# Patient Record
Sex: Female | Born: 1960 | Race: White | Hispanic: No | Marital: Single | Smoking: Never smoker
Health system: Southern US, Community
[De-identification: ages and names within clinical notes are randomized; demographics above are authoritative.]

## PROBLEM LIST (undated history)

## (undated) DIAGNOSIS — I447 Left bundle-branch block, unspecified: Secondary | ICD-10-CM

## (undated) DIAGNOSIS — I5022 Chronic systolic (congestive) heart failure: Principal | ICD-10-CM

## (undated) DIAGNOSIS — I472 Ventricular tachycardia, unspecified: Secondary | ICD-10-CM

## (undated) DIAGNOSIS — G4733 Obstructive sleep apnea (adult) (pediatric): Principal | ICD-10-CM

## (undated) DIAGNOSIS — E7800 Pure hypercholesterolemia, unspecified: Secondary | ICD-10-CM

## (undated) DIAGNOSIS — K219 Gastro-esophageal reflux disease without esophagitis: Secondary | ICD-10-CM

## (undated) DIAGNOSIS — I428 Other cardiomyopathies: Secondary | ICD-10-CM

## (undated) DIAGNOSIS — I509 Heart failure, unspecified: Secondary | ICD-10-CM

## (undated) HISTORY — DX: Gastro-esophageal reflux disease without esophagitis: K21.9

## (undated) HISTORY — DX: Pure hypercholesterolemia, unspecified: E78.00

## (undated) HISTORY — DX: Chronic systolic (congestive) heart failure: I50.22

## (undated) HISTORY — DX: Other cardiomyopathies: I42.8

## (undated) HISTORY — PX: TOTAL ABDOMINAL HYSTERECTOMY: SHX209

## (undated) HISTORY — DX: Left bundle-branch block, unspecified: I44.7

## (undated) HISTORY — DX: Ventricular tachycardia: I47.2

## (undated) HISTORY — DX: Morbid (severe) obesity due to excess calories: E66.01

## (undated) HISTORY — DX: Ventricular tachycardia, unspecified: I47.20

## (undated) HISTORY — DX: Obstructive sleep apnea (adult) (pediatric): G47.33

---

## 2004-05-31 ENCOUNTER — Other Ambulatory Visit: Admission: RE | Admit: 2004-05-31 | Discharge: 2004-05-31 | Payer: Self-pay | Admitting: Family Medicine

## 2005-08-06 ENCOUNTER — Other Ambulatory Visit: Admission: RE | Admit: 2005-08-06 | Discharge: 2005-08-06 | Payer: Self-pay | Admitting: Family Medicine

## 2007-04-02 ENCOUNTER — Other Ambulatory Visit: Admission: RE | Admit: 2007-04-02 | Discharge: 2007-04-02 | Payer: Self-pay | Admitting: Family Medicine

## 2008-04-13 ENCOUNTER — Other Ambulatory Visit: Admission: RE | Admit: 2008-04-13 | Discharge: 2008-04-13 | Payer: Self-pay | Admitting: Family Medicine

## 2009-07-19 ENCOUNTER — Other Ambulatory Visit: Admission: RE | Admit: 2009-07-19 | Discharge: 2009-07-19 | Payer: Self-pay | Admitting: Obstetrics and Gynecology

## 2009-08-13 ENCOUNTER — Encounter (INDEPENDENT_AMBULATORY_CARE_PROVIDER_SITE_OTHER): Payer: Self-pay | Admitting: Obstetrics and Gynecology

## 2009-08-13 ENCOUNTER — Inpatient Hospital Stay (HOSPITAL_COMMUNITY): Admission: RE | Admit: 2009-08-13 | Discharge: 2009-08-15 | Payer: Self-pay | Admitting: Obstetrics and Gynecology

## 2010-03-28 ENCOUNTER — Ambulatory Visit: Payer: Self-pay | Admitting: Cardiology

## 2010-05-20 ENCOUNTER — Ambulatory Visit: Payer: Self-pay | Admitting: Cardiology

## 2010-07-28 HISTORY — PX: CARDIAC DEFIBRILLATOR PLACEMENT: SHX171

## 2010-10-13 LAB — CBC
HCT: 47 % — ABNORMAL HIGH (ref 36.0–46.0)
Hemoglobin: 15.7 g/dL — ABNORMAL HIGH (ref 12.0–15.0)
MCHC: 33.5 g/dL (ref 30.0–36.0)
MCV: 94.5 fL (ref 78.0–100.0)
Platelets: 351 10*3/uL (ref 150–400)
RBC: 4.97 MIL/uL (ref 3.87–5.11)
RDW: 13 % (ref 11.5–15.5)
WBC: 9.9 10*3/uL (ref 4.0–10.5)

## 2010-10-13 LAB — COMPREHENSIVE METABOLIC PANEL
ALT: 16 U/L (ref 0–35)
AST: 21 U/L (ref 0–37)
Albumin: 3.8 g/dL (ref 3.5–5.2)
Alkaline Phosphatase: 49 U/L (ref 39–117)
BUN: 17 mg/dL (ref 6–23)
CO2: 27 mEq/L (ref 19–32)
Calcium: 9.3 mg/dL (ref 8.4–10.5)
Chloride: 101 mEq/L (ref 96–112)
Creatinine, Ser: 0.9 mg/dL (ref 0.4–1.2)
GFR calc Af Amer: >60 mL/min (ref >60)
GFR calc non Af Amer: >60 mL/min (ref >60)
Glucose, Bld: 99 mg/dL (ref 70–99)
Potassium: 3.7 mEq/L (ref 3.5–5.1)
Sodium: 136 mEq/L (ref 135–145)
Total Bilirubin: 0.4 mg/dL (ref 0.3–1.2)
Total Protein: 7.6 g/dL (ref 6.0–8.3)

## 2010-10-13 LAB — PREGNANCY, URINE: Preg Test, Ur: NEGATIVE

## 2010-10-14 LAB — BASIC METABOLIC PANEL
BUN: 7 mg/dL (ref 6–23)
CO2: 29 mEq/L (ref 19–32)
Calcium: 8.6 mg/dL (ref 8.4–10.5)
Chloride: 103 mEq/L (ref 96–112)
Creatinine, Ser: 0.69 mg/dL (ref 0.4–1.2)
GFR calc Af Amer: >60 mL/min (ref >60)
GFR calc non Af Amer: >60 mL/min (ref >60)
Glucose, Bld: 99 mg/dL (ref 70–99)
Potassium: 4.2 mEq/L (ref 3.5–5.1)
Sodium: 135 mEq/L (ref 135–145)

## 2010-10-14 LAB — TYPE AND SCREEN
ABO/RH(D): AB POS
Antibody Screen: NEGATIVE

## 2010-10-14 LAB — CBC
HCT: 38.7 % (ref 36.0–46.0)
Hemoglobin: 13.1 g/dL (ref 12.0–15.0)
MCHC: 33.7 g/dL (ref 30.0–36.0)
MCV: 95.1 fL (ref 78.0–100.0)
Platelets: 297 10*3/uL (ref 150–400)
RBC: 4.07 MIL/uL (ref 3.87–5.11)
RDW: 12.7 % (ref 11.5–15.5)
WBC: 15.7 10*3/uL — ABNORMAL HIGH (ref 4.0–10.5)

## 2010-10-14 LAB — ABO/RH: ABO/RH(D): AB POS

## 2011-01-18 ENCOUNTER — Other Ambulatory Visit: Payer: Self-pay | Admitting: Cardiology

## 2011-01-20 NOTE — Telephone Encounter (Signed)
escribe medication per fax request  

## 2011-09-27 ENCOUNTER — Other Ambulatory Visit: Payer: Self-pay | Admitting: Cardiology

## 2011-12-21 ENCOUNTER — Encounter: Payer: Self-pay | Admitting: *Deleted

## 2012-02-10 ENCOUNTER — Other Ambulatory Visit: Payer: Self-pay | Admitting: Cardiology

## 2012-06-10 ENCOUNTER — Encounter: Payer: Self-pay | Admitting: Cardiology

## 2012-09-17 ENCOUNTER — Encounter: Payer: Self-pay | Admitting: Cardiology

## 2013-04-25 ENCOUNTER — Encounter: Payer: Self-pay | Admitting: Internal Medicine

## 2013-04-25 ENCOUNTER — Other Ambulatory Visit: Payer: Self-pay | Admitting: Internal Medicine

## 2013-04-25 ENCOUNTER — Ambulatory Visit (INDEPENDENT_AMBULATORY_CARE_PROVIDER_SITE_OTHER): Payer: BC Managed Care – PPO | Admitting: Internal Medicine

## 2013-04-25 VITALS — BP 120/82 | HR 72 | Ht 62.0 in | Wt 227.0 lb

## 2013-04-25 DIAGNOSIS — I429 Cardiomyopathy, unspecified: Secondary | ICD-10-CM | POA: Insufficient documentation

## 2013-04-25 DIAGNOSIS — Z95810 Presence of automatic (implantable) cardiac defibrillator: Secondary | ICD-10-CM

## 2013-04-25 DIAGNOSIS — I428 Other cardiomyopathies: Secondary | ICD-10-CM

## 2013-04-25 DIAGNOSIS — I5022 Chronic systolic (congestive) heart failure: Secondary | ICD-10-CM | POA: Insufficient documentation

## 2013-04-25 HISTORY — DX: Chronic systolic (congestive) heart failure: I50.22

## 2013-04-25 LAB — BASIC METABOLIC PANEL
BUN: 10 mg/dL (ref 6–23)
CO2: 32 mEq/L (ref 19–32)
Calcium: 8.8 mg/dL (ref 8.4–10.5)
Chloride: 103 mEq/L (ref 96–112)
Creatinine, Ser: 0.8 mg/dL (ref 0.4–1.2)
GFR: 82.27 mL/min (ref >60.00)
Glucose, Bld: 105 mg/dL — ABNORMAL HIGH (ref 70–99)
Potassium: 3.3 mEq/L — ABNORMAL LOW (ref 3.5–5.1)
Sodium: 138 mEq/L (ref 135–145)

## 2013-04-25 MED ORDER — SPIRONOLACTONE 25 MG PO TABS: 12.5000 mg | ORAL_TABLET | Freq: Every day | ORAL | Status: DC

## 2013-04-25 NOTE — Assessment & Plan Note (Signed)
The patient's device was interrogated.  The information was reviewed. No changes were made in the programming.    

## 2013-04-25 NOTE — Assessment & Plan Note (Signed)
The patient is euvolemic. We will continue her current beta blockers and ARB therapy. We'll begin her on low-dose Aldactone her triple therapy given her cardiomyopathy. We'll start her on 12.5 mg a day. We'll check a metabolic profile today in one week in 3 weeks. At that time she'll come back to see BE-PA at which time I would have her carvedilol dose increased to 18.75 twice daily if blood pressure will allow. I will see her again in 6 weeks

## 2013-04-25 NOTE — Patient Instructions (Addendum)
Your physician recommends that you return for lab work in: YOU WILL HAVE YOUR KIDNEYS CHECKED TODAY 04-25-2013, 05-02-2013, 05-16-2013, 06-06-2013  Your physician recommends that you schedule a follow-up appointment in: [facility] IN 6 WEEKS WITH BMET  Your physician recommends that you schedule a follow-up appointment in: APPOINTMENT WITH DR. Graciela Husbands IN 3 MONTHS WITH BMET  Your physician recommends that you continue on your current medications as directed. Please refer to the Current Medication list given to you today.

## 2013-04-25 NOTE — Progress Notes (Signed)
ELECTROPHYSIOLOGY CONSULT NOTE  Patient ID: Alison Howard, MRN: [id number], DOB/AGE: 01-May-1961 52 y.o. Admit date: (Not on file) Date of Consult: 04/25/2013  Primary Physician: No primary provider on file. Primary Cardiologist:  new  Chief Complaint:  establish   HPI Alison Howard is a 52 y.o. female  Seen to establish for CRT-D implanted at [facility] for cardiomyopathy presumed nonischemic of >5 yrs duration  She is able to climb stairs and walk 150-200 yds She denies PND and chest pain  She has sleep apnea but only on her back so she does not use CPAP     Past Medical History  Diagnosis Date  . NICM (nonischemic cardiomyopathy)     dialted  . Hypercholesteremia   . LBBB (left bundle branch block)   . GERD (gastroesophageal reflux disease)   . Chronic systolic heart failure 04/25/2013  . Cardiomyopathy 04/25/2013  . 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 Total abdominal hysterectomy    . Pacermaker  3/12     Home Meds: Prior to Admission medications   Medication Sig Start Date End Date Taking? Authorizing Provider  carvedilol (COREG) 12.5 MG tablet Take 12.5 mg by mouth 2 (two) times daily with a meal.   Yes Historical Provider, MD  citalopram (CELEXA) 40 MG tablet Take 40 mg by mouth daily.   Yes Historical Provider, MD  DIOVAN 80 MG tablet TAKE 1 TABLET DAILY 01/18/11  Yes Peter M [place], MD  pantoprazole (PROTONIX) 40 MG tablet Take 40 mg by mouth daily.   Yes Historical Provider, MD  spironolactone (ALDACTONE) 25 MG tablet Take 0.5 tablets (12.5 mg total) by mouth daily. 04/25/13   Duke Salvia, MD    Allergies:  Allergies  Allergen Reactions  . Codeine   . Morphine And Related   . Sulfa Antibiotics     History   Social History  . Marital Status: Divorced    Spouse Name: N/A    Number of Children: 0  . Years of Education: N/A   Occupational History  .  Not on file.   Social History Main Topics  . Smoking status: Never Smoker   . Smokeless tobacco: Not on file  . Alcohol Use: No  . Drug Use: Not on file  . Sexual Activity: Not on file   Other Topics Concern  . Not on file   Social History Narrative  . No narrative on file     Family History  Problem Relation Age of Onset  . Coronary artery disease Mother   . Hyperlipidemia Sister   . Heart attack Mother      ROS:  Please see the history of present illness.     All other systems reviewed and negative.    Physical Exam:   Blood pressure 120/82, pulse 72, height 5\' 2"  (1.575 m), weight 227 lb (102.967 kg). General: Well developed, well nourished female in no acute distress. Head: Normocephalic, atraumatic, sclera non-icteric, no xanthomas, nares are without discharge. EENT: normal Lymph Nodes:  none Back: without scoliosis/kyphosis, no CVA tendersness Neck: Negative for carotid bruits. JVD not elevated. Lungs: Clear bilaterally to auscultation without wheezes, rales, or rhonchi. Breathing is unlabored. Device pocket well healed; without hematoma or erythema.  There is no tethering\ Heart: RRR with S1 S2. No murmur , rubs, or gallops appreciated. Abdomen: Soft, non-tender, non-distended with normoactive bowel sounds.  No hepatomegaly. No rebound/guarding. No obvious abdominal masses. Msk:  Strength and tone appear normal for age. Extremities: No clubbing or cyanosis. No  edema.  Distal pedal pulses are 2+ and equal bilaterally. Skin: Warm and Dry Neuro: Alert and oriented X 3. CN III-XII intact Grossly normal sensory and motor function . Psych:  Responds to questions appropriately with a normal affect.      Labs: Cardiac Enzymes No results found for this basename: CKTOTAL, CKMB, TROPONINI,  in the last 72 hours CBC Lab Results  Component Value Date   WBC 15.7* 08/14/2009   HGB 13.1 08/14/2009   HCT 38.7 08/14/2009   MCV 95.1 08/14/2009   PLT 297 08/14/2009    PROTIME: No results found for this basename: LABPROT, INR,  in the last 72 hours Chemistry  Recent Labs Lab 04/25/13 1555  NA 138  K 3.3*  CL 103  CO2 32  BUN 10  CREATININE 0.8  CALCIUM 8.8  GLUCOSE 105*   Lipids No results found for this basename: CHOL, HDL, LDLCALC, TRIG   BNP No results found for this basename: probnp   Miscellaneous No results found for this basename: DDIMER    Radiology/Studies:  No results found.  EKG:    Assessment and Plan:    Sherryl Manges

## 2013-04-25 NOTE — Assessment & Plan Note (Signed)
The patient has a cardiomyopathy for which she has never been cathed . Apparently she had a nonischemic Myoview. It is presumed nonischemic. We have discussed the value of higher doses of beta blockers and the advantage of the adjunctive aldosterone antagonist therapy,  We'll begin slowly with close serial follow potassium levels.  Will discuss with Dr. Dorthea Cove whether my bias towards catheterization remains appropriate in this patient with a cardiomyopathy

## 2013-04-26 ENCOUNTER — Encounter: Payer: Self-pay | Admitting: Cardiology

## 2013-04-26 ENCOUNTER — Telehealth: Payer: Self-pay | Admitting: *Deleted

## 2013-04-29 LAB — ICD DEVICE OBSERVATION
AL AMPLITUDE: 3.6 mv
AL IMPEDENCE ICD: 400 Ohm
AL THRESHOLD: 0.75 V
ATRIAL PACING ICD: 1.6 pct
DEV-0020ICD: NEGATIVE
DEVICE MODEL ICD: 642812
HV IMPEDENCE: 50 Ohm
LV LEAD IMPEDENCE ICD: 890 Ohm
LV LEAD THRESHOLD: 0.75 V
RV LEAD AMPLITUDE: 12 mv
RV LEAD IMPEDENCE ICD: 460 Ohm
RV LEAD THRESHOLD: 2 V
TZON-0003FASTVT: 331.4 ms
VENTRICULAR PACING ICD: 94 pct

## 2013-05-02 ENCOUNTER — Encounter: Payer: Self-pay | Admitting: Internal Medicine

## 2013-05-02 ENCOUNTER — Other Ambulatory Visit (INDEPENDENT_AMBULATORY_CARE_PROVIDER_SITE_OTHER): Payer: BC Managed Care – PPO

## 2013-05-02 DIAGNOSIS — Z95810 Presence of automatic (implantable) cardiac defibrillator: Secondary | ICD-10-CM

## 2013-05-02 DIAGNOSIS — I429 Cardiomyopathy, unspecified: Secondary | ICD-10-CM

## 2013-05-02 DIAGNOSIS — I428 Other cardiomyopathies: Secondary | ICD-10-CM

## 2013-05-02 DIAGNOSIS — I5022 Chronic systolic (congestive) heart failure: Secondary | ICD-10-CM

## 2013-05-02 LAB — BASIC METABOLIC PANEL
BUN: 11 mg/dL (ref 6–23)
CO2: 27 mEq/L (ref 19–32)
Calcium: 8.7 mg/dL (ref 8.4–10.5)
Chloride: 103 mEq/L (ref 96–112)
Creatinine, Ser: 0.8 mg/dL (ref 0.4–1.2)
GFR: 77.65 mL/min (ref >60.00)
Glucose, Bld: 86 mg/dL (ref 70–99)
Potassium: 3.6 mEq/L (ref 3.5–5.1)
Sodium: 137 mEq/L (ref 135–145)

## 2013-05-04 ENCOUNTER — Encounter: Payer: Self-pay | Admitting: Internal Medicine

## 2013-05-16 ENCOUNTER — Other Ambulatory Visit (INDEPENDENT_AMBULATORY_CARE_PROVIDER_SITE_OTHER): Payer: BC Managed Care – PPO

## 2013-05-16 DIAGNOSIS — I428 Other cardiomyopathies: Secondary | ICD-10-CM

## 2013-05-16 DIAGNOSIS — Z95810 Presence of automatic (implantable) cardiac defibrillator: Secondary | ICD-10-CM

## 2013-05-16 DIAGNOSIS — I5022 Chronic systolic (congestive) heart failure: Secondary | ICD-10-CM

## 2013-05-16 DIAGNOSIS — I429 Cardiomyopathy, unspecified: Secondary | ICD-10-CM

## 2013-05-16 LAB — BASIC METABOLIC PANEL
BUN: 10 mg/dL (ref 6–23)
CO2: 31 mEq/L (ref 19–32)
Calcium: 9.1 mg/dL (ref 8.4–10.5)
Chloride: 104 mEq/L (ref 96–112)
Creatinine, Ser: 0.8 mg/dL (ref 0.4–1.2)
GFR: 79.88 mL/min (ref >60.00)
Glucose, Bld: 84 mg/dL (ref 70–99)
Potassium: 4.1 mEq/L (ref 3.5–5.1)
Sodium: 140 mEq/L (ref 135–145)

## 2013-05-24 NOTE — Telephone Encounter (Signed)
Opened in error

## 2013-05-27 ENCOUNTER — Other Ambulatory Visit: Payer: Self-pay

## 2013-05-27 MED ORDER — VALSARTAN 80 MG PO TABS: ORAL_TABLET | ORAL | Status: DC

## 2013-06-02 ENCOUNTER — Other Ambulatory Visit: Payer: Self-pay

## 2013-06-06 ENCOUNTER — Other Ambulatory Visit: Payer: BC Managed Care – PPO

## 2013-06-07 ENCOUNTER — Other Ambulatory Visit: Payer: BC Managed Care – PPO

## 2013-06-07 ENCOUNTER — Encounter: Payer: BC Managed Care – PPO | Admitting: Cardiology

## 2013-06-10 ENCOUNTER — Encounter: Payer: Self-pay | Admitting: Cardiology

## 2013-06-10 ENCOUNTER — Ambulatory Visit (INDEPENDENT_AMBULATORY_CARE_PROVIDER_SITE_OTHER): Payer: BC Managed Care – PPO | Admitting: Cardiology

## 2013-06-10 ENCOUNTER — Other Ambulatory Visit (INDEPENDENT_AMBULATORY_CARE_PROVIDER_SITE_OTHER): Payer: BC Managed Care – PPO

## 2013-06-10 VITALS — BP 84/58 | HR 71 | Ht 62.0 in | Wt 232.8 lb

## 2013-06-10 DIAGNOSIS — I959 Hypotension, unspecified: Secondary | ICD-10-CM

## 2013-06-10 DIAGNOSIS — R5383 Other fatigue: Secondary | ICD-10-CM

## 2013-06-10 DIAGNOSIS — I5022 Chronic systolic (congestive) heart failure: Secondary | ICD-10-CM

## 2013-06-10 DIAGNOSIS — I429 Cardiomyopathy, unspecified: Secondary | ICD-10-CM

## 2013-06-10 DIAGNOSIS — I428 Other cardiomyopathies: Secondary | ICD-10-CM

## 2013-06-10 DIAGNOSIS — R5381 Other malaise: Secondary | ICD-10-CM

## 2013-06-10 DIAGNOSIS — Z95810 Presence of automatic (implantable) cardiac defibrillator: Secondary | ICD-10-CM

## 2013-06-10 LAB — BASIC METABOLIC PANEL
BUN: 13 mg/dL (ref 6–23)
CO2: 29 mEq/L (ref 19–32)
Calcium: 9.1 mg/dL (ref 8.4–10.5)
Chloride: 103 mEq/L (ref 96–112)
Creatinine, Ser: 0.8 mg/dL (ref 0.4–1.2)
GFR: 81.03 mL/min (ref >60.00)
Glucose, Bld: 88 mg/dL (ref 70–99)
Potassium: 3.9 mEq/L (ref 3.5–5.1)
Sodium: 138 mEq/L (ref 135–145)

## 2013-06-10 NOTE — Patient Instructions (Addendum)
Your physician recommends that you schedule a follow-up appointment in: WITH DR. Graciela Husbands IN 3 WEEKS (July 06, 2013)  Your physician has recommended you make the following change in your medication:   STOP ALDACTONE  Your physician recommends that you continue on your current medications as directed. Please refer to the Current Medication list given to you today.

## 2013-06-10 NOTE — Progress Notes (Signed)
ELECTROPHYSIOLOGY OFFICE NOTE  Patient ID: Alison Howard MRN: [id number], DOB/AGE: [birth year]   Date of Visit: 06/10/2013  Primary Physician: No primary provider on file. Primary Cardiologist: Berton Mount, MD Reason for Visit: CHF/CM medical management  History of Present Illness  Alison Howard is a 52 y.o. female with a presumed NICM >5 years in duration, LBBB, now s/p CRT-D implant at [facility] in [place] and hypothyroidism who presented 3 weeks ago to establish care with Dr. Graciela Husbands for electrophysiology followup. At that time, he began optimizing her medical regimen. He added spironolactone and increased carvedilol. She presents today for follow-up and further medication titration as indicated.  Since last being seen in our clinic, she reports she has noticed increasing fatigue and "sleepiness." She reports frequently falling asleep anytime she sits still. Her energy level is low. This seems to be worse since starting spironolactone. Her BP is low today. She denies chest pain or shortness of breath. She denies palpitations, dizziness, near syncope or syncope. She denies LE swelling, orthopnea, PND or recent weight gain. She is compliant and tolerating medications without difficulty.  Past Medical History Past Medical History  Diagnosis Date  . NICM (nonischemic cardiomyopathy)     dialted  . Hypercholesteremia   . LBBB (left bundle branch block)   . GERD (gastroesophageal reflux disease)   . Chronic systolic heart failure 04/25/2013  . Cardiomyopathy 04/25/2013  . Biventricular defibrillator- St. Jude 04/25/2013    DOI 2012     Past Surgical History Past Surgical History  Procedure Laterality Date  . Total abdominal hysterectomy      Allergies/Intolerances Allergies  Allergen Reactions  . Codeine   . Morphine And Related   . Sulfa Antibiotics     Current Home Medications Current Outpatient Prescriptions  Medication Sig Dispense Refill  . carvedilol (COREG) 12.5 MG tablet Take 12.5  mg by mouth 2 (two) times daily with a meal.      . citalopram (CELEXA) 40 MG tablet Take 40 mg by mouth daily.      . pantoprazole (PROTONIX) 40 MG tablet Take 40 mg by mouth daily.      Marland Kitchen spironolactone (ALDACTONE) 25 MG tablet Take 0.5 tablets (12.5 mg total) by mouth daily.  30 tablet  3  . valsartan (DIOVAN) 80 MG tablet TAKE 1 TABLET DAILY  90 tablet  3   No current facility-administered medications for this visit.    Social History Social History  . Marital Status: Divorced   Social History Main Topics  . Smoking status: Never Smoker   . Smokeless tobacco: No  . Alcohol Use: No  . Drug Use: No   Review of Systems General: No chills, fever, night sweats or weight changes Cardiovascular: No chest pain, dyspnea on exertion, edema, orthopnea, palpitations, paroxysmal nocturnal dyspnea Dermatological: No rash, lesions or masses Respiratory: No cough, dyspnea Urologic: No hematuria, dysuria Abdominal: No nausea, vomiting, diarrhea, bright red blood per rectum, melena, or hematemesis Neurologic: No visual changes, weakness, changes in mental status All other systems reviewed and are otherwise negative except as noted above.  Physical Exam Vitals: Blood pressure 84/58, pulse 71, height 5\' 2"  (1.575 m), weight 232 lb 12.8 oz (105.597 kg), SpO2 98.00%.  General: Well developed, well appearing 52 y.o. female in no acute distress. HEENT: Normocephalic, atraumatic. EOMs intact. Sclera nonicteric. Oropharynx clear.  Neck: Supple. No JVD. Lungs: Respirations regular and unlabored, CTA bilaterally. No wheezes, rales or rhonchi. Heart: RRR. S1, S2 present. No murmurs, rub, S3  or S4. Abdomen: Soft, non-distended.  Extremities: No clubbing, cyanosis or edema. PT/Radials 2+ and equal bilaterally. Psych: Normal affect. Neuro: Alert and oriented X 3. Moves all extremities spontaneously.   Diagnostics Recent device interrogation reviewed - normal device function BMET pending  Assessment  and Plan 1. Chronic systolic HF - stable; euvolemic - BP will not allow up-titration of meds at this time; in fact, she reports worsening fatigue and no energy since adding spironolactone so I am inclined to discontinue this today to see if that helps improve her symptoms  - will repeat BMET today - return for follow-up with Dr. Graciela Husbands in 3 weeks 2. NICM s/p CRT-D implant - normal device function by recent interrogation  Signed, Shaniya Tashiro, PA-C 06/10/2013, 9:18 AM

## 2013-07-04 ENCOUNTER — Other Ambulatory Visit: Payer: Self-pay | Admitting: Family Medicine

## 2013-07-04 ENCOUNTER — Ambulatory Visit
Admission: RE | Admit: 2013-07-04 | Discharge: 2013-07-04 | Disposition: A | Discharge status: Home / Self Care | Payer: BC Managed Care – PPO | Source: Ambulatory Visit | Attending: Family Medicine | Admitting: Family Medicine

## 2013-07-04 DIAGNOSIS — M5412 Radiculopathy, cervical region: Secondary | ICD-10-CM

## 2013-07-06 ENCOUNTER — Other Ambulatory Visit (INDEPENDENT_AMBULATORY_CARE_PROVIDER_SITE_OTHER): Payer: BC Managed Care – PPO

## 2013-07-06 ENCOUNTER — Ambulatory Visit (INDEPENDENT_AMBULATORY_CARE_PROVIDER_SITE_OTHER): Payer: BC Managed Care – PPO | Admitting: Internal Medicine

## 2013-07-06 ENCOUNTER — Encounter: Payer: Self-pay | Admitting: Internal Medicine

## 2013-07-06 VITALS — BP 125/83 | HR 79 | Ht 63.0 in | Wt 230.8 lb

## 2013-07-06 DIAGNOSIS — I429 Cardiomyopathy, unspecified: Secondary | ICD-10-CM

## 2013-07-06 DIAGNOSIS — I428 Other cardiomyopathies: Secondary | ICD-10-CM

## 2013-07-06 DIAGNOSIS — Z95810 Presence of automatic (implantable) cardiac defibrillator: Secondary | ICD-10-CM

## 2013-07-06 DIAGNOSIS — I493 Ventricular premature depolarization: Secondary | ICD-10-CM

## 2013-07-06 DIAGNOSIS — I5022 Chronic systolic (congestive) heart failure: Secondary | ICD-10-CM

## 2013-07-06 DIAGNOSIS — I959 Hypotension, unspecified: Secondary | ICD-10-CM

## 2013-07-06 DIAGNOSIS — I4949 Other premature depolarization: Secondary | ICD-10-CM

## 2013-07-06 LAB — MDC_IDC_ENUM_SESS_TYPE_INCLINIC
Battery Remaining Longevity: 44.4 mo
Brady Statistic RA Percent Paced: 0.37 %
Brady Statistic RV Percent Paced: 91 %
Date Time Interrogation Session: 20141210095421
HighPow Impedance: 47.7443
Implantable Pulse Generator Serial Number: 642812
Lead Channel Impedance Value: 400 Ohm
Lead Channel Impedance Value: 462.5 Ohm
Lead Channel Impedance Value: 887.5 Ohm
Lead Channel Pacing Threshold Amplitude: 0.875 V
Lead Channel Pacing Threshold Amplitude: 1.125 V
Lead Channel Pacing Threshold Amplitude: 1.5 V
Lead Channel Pacing Threshold Pulse Width: 0.5 ms
Lead Channel Pacing Threshold Pulse Width: 0.5 ms
Lead Channel Pacing Threshold Pulse Width: 0.5 ms
Lead Channel Sensing Intrinsic Amplitude: 10.5 mV
Lead Channel Sensing Intrinsic Amplitude: 3.5 mV
Lead Channel Setting Pacing Amplitude: 2 V
Lead Channel Setting Pacing Amplitude: 2.125
Lead Channel Setting Pacing Amplitude: 2.5 V
Lead Channel Setting Pacing Pulse Width: 0.5 ms
Lead Channel Setting Pacing Pulse Width: 0.5 ms
Lead Channel Setting Sensing Sensitivity: 0.5 mV
Zone Setting Detection Interval: 300 ms
Zone Setting Detection Interval: 330 ms
Zone Setting Detection Interval: 400 ms

## 2013-07-06 LAB — BASIC METABOLIC PANEL
BUN: 13 mg/dL (ref 6–23)
CO2: 29 mEq/L (ref 19–32)
Calcium: 9 mg/dL (ref 8.4–10.5)
Chloride: 104 mEq/L (ref 96–112)
Creatinine, Ser: 0.9 mg/dL (ref 0.4–1.2)
GFR: 74.44 mL/min (ref >60.00)
Glucose, Bld: 99 mg/dL (ref 70–99)
Potassium: 3.8 mEq/L (ref 3.5–5.1)
Sodium: 141 mEq/L (ref 135–145)

## 2013-07-06 MED ORDER — CARVEDILOL 12.5 MG PO TABS: ORAL_TABLET | ORAL | Status: DC

## 2013-07-06 NOTE — Progress Notes (Signed)
      Patient Care Team: Johny Blamer, MD as PCP - General (Family Medicine)   HPI  Alison Howard is a 52 y.o. female Seen infollowup for CRT-D implanted at [facility] for cardiomyopathy presumed nonischemic of >5 yrs duration   She is able to climb stairs and walk 150-200 yds  She denies PND and chest pain  She has sleep apnea but only on her back so she does not use CPAP  Recently started on Aldactone and post initiation potassium on 11/14 was 3.9;  BE is stopped the medication because of associated fatigue; she is better off of the medication   Her echo in 8/13 demonstrated ejection fraction of 40-45%./  Past Medical History  Diagnosis Date  . NICM (nonischemic cardiomyopathy)     dialted  . Hypercholesteremia   . LBBB (left bundle branch block)   . GERD (gastroesophageal reflux disease)   . Chronic systolic heart failure 04/25/2013  . Cardiomyopathy 04/25/2013  . 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 Total abdominal hysterectomy      Current Outpatient Prescriptions  Medication Sig Dispense Refill  . carvedilol (COREG) 12.5 MG tablet Take 12.5 mg by mouth 2 (two) times daily with a meal.      . citalopram (CELEXA) 40 MG tablet Take 40 mg by mouth daily.      Marland Kitchen levothyroxine (SYNTHROID, LEVOTHROID) 25 MCG tablet Take 25 mcg by mouth daily before breakfast.      . pantoprazole (PROTONIX) 40 MG tablet Take 40 mg by mouth daily.      . valsartan (DIOVAN) 80 MG tablet TAKE 1 TABLET DAILY  90 tablet  3   No current facility-administered medications for this visit.    Allergies  Allergen Reactions  . Codeine   . Morphine And Related   . Sulfa Antibiotics     Review of Systems negative except from HPI and PMH  Physical Exam BP 125/83  Pulse 79  Ht 5\' 3"  (1.6 m)  Wt 230 lb 12.8 oz (104.69 kg)  BMI 40.89 kg/m2 Well developed and well nourished in no acute distress HENT normal E  scleral and icterus clear Neck Supple JVP flat; carotids brisk and full Clear to ausculation  Regular rate and rhythm, no murmurs gallops or rub Soft with active bowel sounds No clubbing cyanosis none Edema Alert and oriented, grossly normal motor and sensory function Skin Warm and Dry  ECG demonstrates AV biventricular pacing with 4/13 PVCs occurring with a right bundle superior axis morphology  Assessment and  Plan

## 2013-07-06 NOTE — Assessment & Plan Note (Signed)
./  dfn .sfn The patient's device was interrogated.  The information was reviewed. No changes were made in the programming.

## 2013-07-06 NOTE — Assessment & Plan Note (Signed)
The patient has a PVC burden between 5 and 9%. Some ECGs demonstrated PVCs occurring following the P wave consistent with been the observation of A paced V. sensed events as PVCs.  We will track this burden as relates to her cardiomyopathy.

## 2013-07-06 NOTE — Assessment & Plan Note (Signed)
As above.

## 2013-07-06 NOTE — Assessment & Plan Note (Signed)
Continued guideline directed medical therapy. She is having some hypotension, i.e. blood pressures of 80 in the afternoon associated with fatigue. We'll decrease her a.m. carvedilol 12.5--6.25

## 2013-07-06 NOTE — Assessment & Plan Note (Signed)
Unable to tolerate this are not lactone. We will reassess left ventricular function. In the event that is significantly down we will work on trying to have her on eplerenone or BiDil. If it is in the 40-50% range we'll follow the PVC burden without changes in medication.

## 2013-07-06 NOTE — Patient Instructions (Addendum)
Your physician has recommended you make the following change in your medication:  1) change Carvedilol - Take 6.25 mg in am, and 12.5 in pm  Your physician has requested that you have an echocardiogram. Echocardiography is a painless test that uses sound waves to create images of your heart. It provides your doctor with information about the size and shape of your heart and how well your heart's chambers and valves are working. This procedure takes approximately one hour. There are no restrictions for this procedure.  Your physician recommends that you have lab work today: BMET  Remote monitoring is used to monitor your Pacemaker of ICD from home. This monitoring reduces the number of office visits required to check your device to one time per year. It allows [place] to keep an eye on the functioning of your device to ensure it is working properly. You are scheduled for a device check from home on 10/07/2013. You may send your transmission at any time that day. If you have a wireless device, the transmission will be sent automatically. After your physician reviews your transmission, you will receive a postcard with your next transmission date.  Your physician wants you to follow-up in: 6 months with Rick Duff, PAC.  You will receive a reminder letter in the mail two months in advance. If you don't receive a letter, please call our office to schedule the follow-up appointment.

## 2013-07-14 ENCOUNTER — Other Ambulatory Visit: Payer: BC Managed Care – PPO

## 2013-07-14 ENCOUNTER — Encounter: Payer: BC Managed Care – PPO | Admitting: Internal Medicine

## 2013-07-19 ENCOUNTER — Encounter: Payer: Self-pay | Admitting: Internal Medicine

## 2013-07-22 ENCOUNTER — Encounter: Payer: Self-pay | Admitting: Cardiology

## 2013-07-22 ENCOUNTER — Ambulatory Visit (HOSPITAL_COMMUNITY): Payer: BC Managed Care – PPO | Attending: Cardiovascular Disease | Admitting: Cardiology

## 2013-07-22 DIAGNOSIS — I079 Rheumatic tricuspid valve disease, unspecified: Secondary | ICD-10-CM | POA: Insufficient documentation

## 2013-07-22 DIAGNOSIS — I509 Heart failure, unspecified: Secondary | ICD-10-CM | POA: Insufficient documentation

## 2013-07-22 DIAGNOSIS — I447 Left bundle-branch block, unspecified: Secondary | ICD-10-CM | POA: Insufficient documentation

## 2013-07-22 DIAGNOSIS — I5022 Chronic systolic (congestive) heart failure: Secondary | ICD-10-CM

## 2013-07-22 DIAGNOSIS — I428 Other cardiomyopathies: Secondary | ICD-10-CM | POA: Insufficient documentation

## 2013-07-22 DIAGNOSIS — I429 Cardiomyopathy, unspecified: Secondary | ICD-10-CM

## 2013-07-22 DIAGNOSIS — I4949 Other premature depolarization: Secondary | ICD-10-CM | POA: Insufficient documentation

## 2013-07-22 NOTE — Progress Notes (Signed)
Echo performed. 

## 2013-07-25 ENCOUNTER — Encounter: Payer: Self-pay | Admitting: Internal Medicine

## 2013-08-01 ENCOUNTER — Encounter: Payer: Self-pay | Admitting: Internal Medicine

## 2013-08-03 ENCOUNTER — Encounter: Payer: BC Managed Care – PPO | Admitting: Internal Medicine

## 2013-08-03 ENCOUNTER — Other Ambulatory Visit: Payer: BC Managed Care – PPO

## 2013-08-08 ENCOUNTER — Encounter: Payer: Self-pay | Admitting: Internal Medicine

## 2013-08-15 ENCOUNTER — Encounter: Payer: Self-pay | Admitting: Internal Medicine

## 2013-08-16 ENCOUNTER — Other Ambulatory Visit: Payer: Self-pay | Admitting: *Deleted

## 2013-08-16 MED ORDER — CARVEDILOL 12.5 MG PO TABS: ORAL_TABLET | ORAL | Status: DC

## 2013-08-24 ENCOUNTER — Encounter: Payer: Self-pay | Admitting: Internal Medicine

## 2013-08-24 ENCOUNTER — Ambulatory Visit (INDEPENDENT_AMBULATORY_CARE_PROVIDER_SITE_OTHER): Payer: BC Managed Care – PPO | Admitting: *Deleted

## 2013-08-24 DIAGNOSIS — Z95810 Presence of automatic (implantable) cardiac defibrillator: Secondary | ICD-10-CM

## 2013-08-24 LAB — MDC_IDC_ENUM_SESS_TYPE_INCLINIC
Implantable Pulse Generator Serial Number: 642812
Lead Channel Setting Pacing Amplitude: 2 V
Lead Channel Setting Pacing Amplitude: 2.125
Lead Channel Setting Pacing Amplitude: 2.5 V
Lead Channel Setting Pacing Pulse Width: 0.5 ms
Lead Channel Setting Pacing Pulse Width: 0.5 ms
Lead Channel Setting Sensing Sensitivity: 0.5 mV
Zone Setting Detection Interval: 300 ms
Zone Setting Detection Interval: 330 ms
Zone Setting Detection Interval: 400 ms

## 2013-08-24 NOTE — Progress Notes (Signed)
Due to discovery on Merlin, changed VT-1 zone from 150 to 162 bpm.   Merlin 10/07/13 & ROV w/ [name] 01/06/14.

## 2013-10-07 ENCOUNTER — Encounter: Payer: BC Managed Care – PPO | Admitting: *Deleted

## 2013-10-10 ENCOUNTER — Encounter: Payer: Self-pay | Admitting: Internal Medicine

## 2013-10-11 ENCOUNTER — Encounter: Payer: Self-pay | Admitting: Cardiology

## 2013-10-14 ENCOUNTER — Ambulatory Visit (INDEPENDENT_AMBULATORY_CARE_PROVIDER_SITE_OTHER): Payer: BC Managed Care – PPO | Admitting: *Deleted

## 2013-10-14 DIAGNOSIS — I5022 Chronic systolic (congestive) heart failure: Secondary | ICD-10-CM

## 2013-10-14 DIAGNOSIS — I428 Other cardiomyopathies: Secondary | ICD-10-CM

## 2013-10-14 DIAGNOSIS — I429 Cardiomyopathy, unspecified: Secondary | ICD-10-CM

## 2013-10-14 LAB — MDC_IDC_ENUM_SESS_TYPE_REMOTE
Battery Remaining Longevity: 38 mo
Brady Statistic RA Percent Paced: 1 %
Brady Statistic RV Percent Paced: 88 %
HighPow Impedance: 48 Ohm
Implantable Pulse Generator Serial Number: 642812
Lead Channel Impedance Value: 440 Ohm
Lead Channel Impedance Value: 450 Ohm
Lead Channel Impedance Value: 840 Ohm
Lead Channel Pacing Threshold Amplitude: 0.875 V
Lead Channel Pacing Threshold Amplitude: 0.875 V
Lead Channel Pacing Threshold Amplitude: 1.75 V
Lead Channel Pacing Threshold Pulse Width: 0.5 ms
Lead Channel Pacing Threshold Pulse Width: 0.5 ms
Lead Channel Pacing Threshold Pulse Width: 0.5 ms
Lead Channel Sensing Intrinsic Amplitude: 11.8 mV
Lead Channel Sensing Intrinsic Amplitude: 4.2 mV
Lead Channel Setting Pacing Amplitude: 2 V
Lead Channel Setting Pacing Amplitude: 2.125
Lead Channel Setting Pacing Amplitude: 2.5 V
Lead Channel Setting Pacing Pulse Width: 0.5 ms
Lead Channel Setting Pacing Pulse Width: 0.5 ms
Lead Channel Setting Sensing Sensitivity: 0.5 mV
Zone Setting Detection Interval: 300 ms
Zone Setting Detection Interval: 330 ms
Zone Setting Detection Interval: 400 ms

## 2013-10-25 NOTE — Progress Notes (Signed)
ICD remote with ICM 

## 2013-10-26 ENCOUNTER — Encounter: Payer: Self-pay | Admitting: *Deleted

## 2013-11-02 ENCOUNTER — Encounter: Payer: Self-pay | Admitting: Internal Medicine

## 2013-11-04 ENCOUNTER — Encounter: Payer: Self-pay | Admitting: Internal Medicine

## 2013-11-14 ENCOUNTER — Encounter: Payer: Self-pay | Admitting: Cardiology

## 2013-12-08 ENCOUNTER — Encounter: Payer: Self-pay | Admitting: Cardiology

## 2013-12-14 ENCOUNTER — Encounter: Payer: Self-pay | Admitting: Internal Medicine

## 2013-12-15 ENCOUNTER — Emergency Department (HOSPITAL_COMMUNITY): Payer: BC Managed Care – PPO

## 2013-12-15 ENCOUNTER — Encounter (HOSPITAL_COMMUNITY): Payer: Self-pay | Admitting: Emergency Medicine

## 2013-12-15 ENCOUNTER — Emergency Department (HOSPITAL_COMMUNITY)
Admission: EM | Admit: 2013-12-15 | Discharge: 2013-12-15 | Disposition: A | Discharge status: Home / Self Care | Payer: BC Managed Care – PPO | Attending: Emergency Medicine | Admitting: Emergency Medicine

## 2013-12-15 DIAGNOSIS — Z862 Personal history of diseases of the blood and blood-forming organs and certain disorders involving the immune mechanism: Secondary | ICD-10-CM | POA: Insufficient documentation

## 2013-12-15 DIAGNOSIS — Z8639 Personal history of other endocrine, nutritional and metabolic disease: Secondary | ICD-10-CM | POA: Insufficient documentation

## 2013-12-15 DIAGNOSIS — S6990XA Unspecified injury of unspecified wrist, hand and finger(s), initial encounter: Secondary | ICD-10-CM | POA: Insufficient documentation

## 2013-12-15 DIAGNOSIS — Y9289 Other specified places as the place of occurrence of the external cause: Secondary | ICD-10-CM | POA: Insufficient documentation

## 2013-12-15 DIAGNOSIS — K219 Gastro-esophageal reflux disease without esophagitis: Secondary | ICD-10-CM | POA: Insufficient documentation

## 2013-12-15 DIAGNOSIS — X500XXA Overexertion from strenuous movement or load, initial encounter: Secondary | ICD-10-CM | POA: Insufficient documentation

## 2013-12-15 DIAGNOSIS — W19XXXA Unspecified fall, initial encounter: Secondary | ICD-10-CM

## 2013-12-15 DIAGNOSIS — S63509A Unspecified sprain of unspecified wrist, initial encounter: Secondary | ICD-10-CM | POA: Insufficient documentation

## 2013-12-15 DIAGNOSIS — Y9389 Activity, other specified: Secondary | ICD-10-CM | POA: Insufficient documentation

## 2013-12-15 DIAGNOSIS — Z79899 Other long term (current) drug therapy: Secondary | ICD-10-CM | POA: Insufficient documentation

## 2013-12-15 DIAGNOSIS — I5022 Chronic systolic (congestive) heart failure: Secondary | ICD-10-CM | POA: Insufficient documentation

## 2013-12-15 DIAGNOSIS — Z95810 Presence of automatic (implantable) cardiac defibrillator: Secondary | ICD-10-CM | POA: Insufficient documentation

## 2013-12-15 MED ORDER — TRAMADOL HCL 50 MG PO TABS: 50.0000 mg | ORAL_TABLET | Freq: Four times a day (QID) | ORAL | Status: DC | PRN

## 2013-12-15 MED ORDER — NAPROXEN 375 MG PO TABS: 375.0000 mg | ORAL_TABLET | Freq: Two times a day (BID) | ORAL | Status: DC

## 2013-12-15 NOTE — ED Notes (Signed)
Patient states she fell yesterday at lunch time and broke her fall with her R hand.   Patient states R hand and wrist pain.

## 2013-12-15 NOTE — ED Provider Notes (Signed)
CSN: [id number]     Arrival date & time 12/15/13  [id number] History   First MD Initiated Contact with Patient 12/15/13 0758    This chart was scribed for Darnelle Goingiffany Bunyan Brier PA-C, a non-physician practitioner working with No att. providers found by Lewanda RifeAlexandra Hurtado, ED Scribe. This patient was seen in room TR09C/TR09C and the patient's care was started at 9:26 AM     Chief Complaint  Patient presents with  . Fall     (Consider location/radiation/quality/duration/timing/severity/associated sxs/prior Treatment) The history is provided by the patient. No language interpreter was used.   HPI Comments: Alison Howard is a 53 y.o. female who presents to the Emergency Department complaining of fall onset yesterday afternoon she attributes to tripping on the sidewalk. Reports she broke her fall with outstretched  right hand. Describes pain as constant and moderate severity. Reports associated right wrist pain and right hand pain. Reports pain is exacerbated by touch and movement. Denies trying any alleviating factors.  Denies associated head injury, back pain, fever, numbness, and neck pain.  Past Medical History  Diagnosis Date  . NICM (nonischemic cardiomyopathy)     dialted  . Hypercholesteremia   . LBBB (left bundle branch block)   . GERD (gastroesophageal reflux disease)   . Chronic systolic heart failure 04/25/2013  . Cardiomyopathy 04/25/2013  . Biventricular defibrillator- St. Jude 04/25/2013    DOI 2012    Past Surgical History  Procedure Laterality Date  . Total abdominal hysterectomy    . Pacemaker placement     Family History  Problem Relation Age of Onset  . Coronary artery disease Mother   . Hyperlipidemia Sister   . Heart attack Mother    History  Substance Use Topics  . Smoking status: Never Smoker   . Smokeless tobacco: Not on file  . Alcohol Use: Yes   OB History   Grav Para Term Preterm Abortions TAB SAB Ect Mult Living                 Review of Systems   Constitutional: Negative for fever.  Musculoskeletal: Positive for arthralgias and myalgias. Negative for back pain.  Neurological: Negative for numbness.      Allergies  Ciprofloxacin hcl; Codeine; Morphine and related; and Sulfa antibiotics  Home Medications   Prior to Admission medications   Medication Sig Start Date End Date Taking? Authorizing Provider  acetaminophen (TYLENOL) 500 MG tablet Take 1,000 mg by mouth daily as needed for mild pain.   Yes Historical Provider, MD  carvedilol (COREG) 12.5 MG tablet Take 12.5 mg by mouth daily.   Yes Historical Provider, MD  citalopram (CELEXA) 40 MG tablet Take 40 mg by mouth daily.   Yes Historical Provider, MD  levothyroxine (SYNTHROID, LEVOTHROID) 25 MCG tablet Take 25 mcg by mouth daily before breakfast.   Yes Historical Provider, MD  pantoprazole (PROTONIX) 40 MG tablet Take 40 mg by mouth daily.   Yes Historical Provider, MD  valsartan (DIOVAN) 80 MG tablet Take 80 mg by mouth daily.   Yes Historical Provider, MD   BP 148/94  Pulse 66  Temp(Src) 98 F (36.7 C) (Oral)  Resp 18  Ht 5\' 2"  (1.575 m)  Wt 220 lb (99.791 kg)  BMI 40.23 kg/m2  SpO2 100% Physical Exam  Nursing note and vitals reviewed. Constitutional: She is oriented to person, place, and time. She appears well-developed and well-nourished. No distress.  HENT:  Head: Normocephalic and atraumatic.  Eyes: EOM are normal.  Neck: Neck  supple.  Cardiovascular: Normal rate.   Pulmonary/Chest: Effort normal. No respiratory distress.  Musculoskeletal:       Right shoulder: Normal. She exhibits normal range of motion.       Right elbow: Normal.She exhibits normal range of motion and no deformity. No tenderness found. No radial head, no medial epicondyle, no lateral epicondyle and no olecranon process tenderness noted.       Right wrist: She exhibits decreased range of motion and tenderness. She exhibits no effusion, no crepitus and no deformity.  Right wrist:  Pain  with lateral and medial flexion of the wrist. Normal radial pulse.   Neurological: She is alert and oriented to person, place, and time.  Skin: Skin is warm and dry.  Psychiatric: She has a normal mood and affect. Her behavior is normal.    ED Course  Procedures (including critical care time) COORDINATION OF CARE:  Nursing notes reviewed. Vital signs reviewed. Initial pt interview and examination performed.   Filed Vitals:   12/15/13 0814 12/15/13 0954  BP: 136/93 148/94  Pulse: 90 66  Temp: 98 F (36.7 C)   TempSrc: Oral   Resp: 18   Height: 5\' 2"  (1.575 m)   Weight: 220 lb (99.791 kg)   SpO2: 95% 100%    9:47 AM-Discussed work up plan with pt at bedside, which includes  Orders Placed This Encounter  Procedures  . DG Hand Complete Right    Standing Status: Standing     Number of Occurrences: 1     Standing Expiration Date:     Order Specific Question:  Reason for exam:    Answer:  FALL  . DG Wrist Complete Right    Standing Status: Standing     Number of Occurrences: 1     Standing Expiration Date:     Order Specific Question:  Reason for exam:    Answer:  FALL  . Splint wrist    Standing Status: Standing     Number of Occurrences: 1     Standing Expiration Date:     Order Specific Question:  Laterality    Answer:  Right      Labs Review Labs Reviewed - No data to display  Imaging Review Dg Wrist Complete Right  12/15/2013   CLINICAL DATA:  Fall, hand and wrist pain  EXAM: RIGHT WRIST - COMPLETE 3+ VIEW  COMPARISON:  None.  FINDINGS: Four views of the right wrist submitted. No acute fracture or subluxation. Mild narrowing of radiocarpal joint space. No radiopaque foreign body.  IMPRESSION: Negative.   Electronically Signed   By: Natasha Mead M.D.   On: 12/15/2013 09:09   Dg Hand Complete Right  12/15/2013   CLINICAL DATA:  Fall, hand and wrist pain  EXAM: RIGHT HAND - COMPLETE 3+ VIEW  COMPARISON:  None.  FINDINGS: Three views of the right hand submitted. No  acute fracture or subluxation. No acute fracture or subluxation. Mild narrowing of radiocarpal joint space. No radiopaque foreign body. 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  IMPRESSION: No acute fracture or subluxation.  Mild osteoarthritic changes.   Electronically Signed   By: Natasha Mead M.D.   On: 12/15/2013 09:09    9:42 AM Nursing Notes Reviewed/ Care Coordinated Applicable Imaging Reviewed and incorporated into ED treatment Discussed results and treatment plan with pt. Pt demonstrates understanding and agrees with plan.  9:47 AM Offered pt pain medication, which she declined.  EKG Interpretation None  MDM   Final diagnoses:  Fall  Wrist sprain    Pt has normal xrays. Most likely diagnosis is wrist sprain. Velcro wrist applied and given referral to hand sepcialist. RICE protocol recommended.   53 y.o.Alison Howard's evaluation in the Emergency Department is complete. It has been determined that no acute conditions requiring further emergency intervention are present at this time. The patient/guardian have been advised of the diagnosis and plan. We have discussed signs and symptoms that warrant return to the ED, such as changes or worsening in symptoms.  Vital signs are stable at discharge. Filed Vitals:   12/15/13 0954  BP: 148/94  Pulse: 66  Temp:   Resp:     Patient/guardian has voiced understanding and agreed to follow-up with the PCP or specialist.    I personally performed the services described in this documentation, which was scribed in my presence. The recorded information has been reviewed and is accurate.    Dorthula Matas, PA-C 12/16/13 [phone]

## 2013-12-15 NOTE — Discharge Instructions (Signed)
Sprain A sprain is a tear in one of the strong, fibrous tissues that connect your bones (ligaments). The severity of the sprain depends on how much of the ligament is torn. The tear can be either partial or complete. CAUSES  Often, sprains are a result of a fall or an injury. The force of the impact causes the fibers of your ligament to stretch beyond their normal length. This excess tension causes the fibers of your ligament to tear. SYMPTOMS  You may have some loss of motion or increased pain within your normal range of motion. Other symptoms include:  Bruising.  Tenderness.  Swelling. DIAGNOSIS  In order to diagnose a sprain, your caregiver will physically examine you to determine how torn the ligament is. Your caregiver may also suggest an X-ray exam to make sure no bones are broken. TREATMENT  If your ligament is only partially torn, treatment usually involves keeping the injured area in a fixed position (immobilization) for a short period. To do this, your caregiver will apply a bandage, cast, or splint to keep the area from moving until it heals. For a partially torn ligament, the healing process usually takes 2 to 3 weeks. If your ligament is completely torn, you may need surgery to reconnect the ligament to the bone or to reconstruct the ligament. After surgery, a cast or splint may be applied and will need to stay on for 4 to 6 weeks while your ligament heals. HOME CARE INSTRUCTIONS  Keep the injured area elevated to decrease swelling.  To ease pain and swelling, apply ice to your joint twice a day, for 2 to 3 days.  Put ice in a plastic bag.  Place a towel between your skin and the bag.  Leave the ice on for 15 minutes.  Only take over-the-counter or prescription medicine for pain as directed by your caregiver.  Do not leave the injured area unprotected until pain and stiffness go away (usually 3 to 4 weeks).  Do not allow your cast or splint to get wet. Cover your cast or  splint with a plastic bag when you shower or bathe. Do not swim.  Your caregiver may suggest exercises for you to do during your recovery to prevent or limit permanent stiffness. SEEK IMMEDIATE MEDICAL CARE IF:  Your cast or splint becomes damaged.  Your pain becomes worse. MAKE SURE YOU:  Understand these instructions.  Will watch your condition.  Will get help right away if you are not doing well or get worse. Document Released: 07/11/2000 Document Revised: 10/06/2011 Document Reviewed: 07/26/2011 [facility] Patient Information 2014 [place], [place].    Wrist Sprain with Rehab A sprain is an injury in which a ligament that maintains the proper alignment of a joint is partially or completely torn. The ligaments of the wrist are susceptible to sprains. Sprains are classified into three categories. Grade 1 sprains cause pain, but the tendon is not lengthened. Grade 2 sprains include a lengthened ligament because the ligament is stretched or partially ruptured. With grade 2 sprains there is still function, although the function may be diminished. Grade 3 sprains are characterized by a complete tear of the tendon or muscle, and function is usually impaired. SYMPTOMS   Pain tenderness, inflammation, and/or bruising (contusion) of the injury.  A "pop" or tear felt and/or heard at the time of injury.  Decreased wrist function. CAUSES  A wrist sprain occurs when a force is placed on one or more ligaments that is greater than it/they can  withstand. Common mechanisms of injury include:  Catching a ball with you hands.  Repetitive and/ or strenuous extension or flexion of the wrist. RISK INCREASES WITH:  Previous wrist injury.  Contact sports (boxing or wrestling).  Activities in which falling is common.  Poor strength and flexibility.  Improperly fitted or padded protective equipment. PREVENTION  Warm up and stretch properly before activity.  Allow for adequate recovery between  workouts.  Maintain physical fitness:  Strength, flexibility, and endurance.  Cardiovascular fitness.  Protect the wrist joint by limiting its motion with the use of taping, braces, or splints.  Protect the wrist after injury for 6 to 12 months. PROGNOSIS  The prognosis for wrist sprains depends on the degree of injury. Grade 1 sprains require 2 to 6 weeks of treatment. Grade 2 sprains require 6 to 8 weeks of treatment, and grade 3 sprains require up to 12 weeks.  RELATED COMPLICATIONS   Prolonged healing time, if improperly treated or re-injured.  Recurrent symptoms that result in a chronic problem.  Injury to nearby structures (bone, cartilage, nerves, or tendons).  Arthritis of the wrist.  Inability to compete in athletics at a high level.  Wrist stiffness or weakness.  Progression to a complete rupture of the ligament. TREATMENT  Treatment initially involves resting from any activities that aggravate the symptoms, and the use of ice and medications to help reduce pain and inflammation. Your caregiver may recommend immobilizing the wrist for a period of time in order to reduce stress on the ligament and allow for healing. After immobilization it is important to perform strengthening and stretching exercises to help regain strength and a full range of motion. These exercises may be completed at home or with a therapist. Surgery is not usually required for wrist sprains, unless the ligament has been ruptured (grade 3 sprain). MEDICATION   If pain medication is necessary, then nonsteroidal anti-inflammatory medications, such as aspirin and ibuprofen, or other minor pain relievers, such as acetaminophen, are often recommended.  Do not take pain medication for 7 days before surgery.  Prescription pain relievers may be given if deemed necessary by your caregiver. Use only as directed and only as much as you need. HEAT AND COLD  Cold treatment (icing) relieves pain and reduces  inflammation. Cold treatment should be applied for 10 to 15 minutes every 2 to 3 hours for inflammation and pain and immediately after any activity that aggravates your symptoms. Use ice packs or massage the area with a piece of ice (ice massage).  Heat treatment may be used prior to performing the stretching and strengthening activities prescribed by your caregiver, physical therapist, or athletic trainer. Use a heat pack or soak your injury in warm water. SEEK MEDICAL CARE IF:  Treatment seems to offer no benefit, or the condition worsens.  Any medications produce adverse side effects. EXERCISES RANGE OF MOTION (ROM) AND STRETCHING EXERCISES - Wrist Sprain  These exercises may help you when beginning to rehabilitate your injury. Your symptoms may resolve with or without further involvement from your physician, physical therapist or athletic trainer. While completing these exercises, remember:   Restoring tissue flexibility helps normal motion to return to the joints. This allows healthier, less painful movement and activity.  An effective stretch should be held for at least 30 seconds.  A stretch should never be painful. You should only feel a gentle lengthening or release in the stretched tissue. RANGE OF MOTION  Wrist Flexion, Active-Assisted  Extend your right / left  elbow with your fingers pointing down.*  Gently pull the back of your hand towards you until you feel a gentle stretch on the top of your forearm.  Hold this position for __________ seconds. Repeat __________ times. Complete this exercise __________ times per day.  *If directed by your physician, physical therapist or athletic trainer, complete this stretch with your elbow bent rather than extended. RANGE OF MOTION  Wrist Extension, Active-Assisted  Extend your right / left elbow and turn your palm upwards.*  Gently pull your palm/fingertips back so your wrist extends and your fingers point more toward the  ground.  You should feel a gentle stretch on the inside of your forearm.  Hold this position for __________ seconds. Repeat __________ times. Complete this exercise __________ times per day. *If directed by your physician, physical therapist or athletic trainer, complete this stretch with your elbow bent, rather than extended. RANGE OF MOTION  Supination, Active  Stand or sit with your elbows at your side. Bend your right / left elbow to 90 degrees.  Turn your palm upward until you feel a gentle stretch on the inside of your forearm.  Hold this position for __________ seconds. Slowly release and return to the starting position. Repeat __________ times. Complete this stretch __________ times per day.  RANGE OF MOTION  Pronation, Active  Stand or sit with your elbows at your side. Bend your right / left elbow to 90 degrees.  Turn your palm downward until you feel a gentle stretch on the top of your forearm.  Hold this position for __________ seconds. Slowly release and return to the starting position. Repeat __________ times. Complete this stretch __________ times per day.  STRETCH - Wrist Flexion  Place the back of your right / left hand on a tabletop leaving your elbow slightly bent. Your fingers should point away from your body.  Gently press the back of your hand down onto the table by straightening your elbow. You should feel a stretch on the top of your forearm.  Hold this position for __________ seconds. Repeat __________ times. Complete this stretch __________ times per day.  STRETCH  Wrist Extension  Place your right / left fingertips on a tabletop leaving your elbow slightly bent. Your fingers should point backwards.  Gently press your fingers and palm down onto the table by straightening your elbow. You should feel a stretch on the inside of your forearm.  Hold this position for __________ seconds. Repeat __________ times. Complete this stretch __________ times per day.   STRENGTHENING EXERCISES - Wrist Sprain These exercises may help you when beginning to rehabilitate your injury. They may resolve your symptoms with or without further involvement from your physician, physical therapist or athletic trainer. While completing these exercises, remember:   Muscles can gain both the endurance and the strength needed for everyday activities through controlled exercises.  Complete these exercises as instructed by your physician, physical therapist or athletic trainer. Progress with the resistance and repetition exercises only as your caregiver advises. STRENGTH Wrist Flexors  Sit with your right / left forearm palm-up and fully supported. Your elbow should be resting below the height of your shoulder. Allow your wrist to extend over the edge of the surface.  Loosely holding a __________ weight or a piece of rubber exercise band/tubing, slowly curl your hand up toward your forearm.  Hold this position for __________ seconds. Slowly lower the wrist back to the starting position in a controlled manner. Repeat __________ times. Complete this  exercise __________ times per day.  STRENGTH  Wrist Extensors  Sit with your right / left forearm palm-down and fully supported. Your elbow should be resting below the height of your shoulder. Allow your wrist to extend over the edge of the surface.  Loosely holding a __________ weight or a piece of rubber exercise band/tubing, slowly curl your hand up toward your forearm.  Hold this position for __________ seconds. Slowly lower the wrist back to the starting position in a controlled manner. Repeat __________ times. Complete this exercise __________ times per day.  STRENGTH - Ulnar Deviators  Stand with a ____________________ weight in your right / left hand, or sit holding on to the rubber exercise band/tubing with your opposite arm supported.  Move your wrist so that your pinkie travels toward your forearm and your thumb moves  away from your forearm.  Hold this position for __________ seconds and then slowly lower the wrist back to the starting position. Repeat __________ times. Complete this exercise __________ times per day STRENGTH - Radial Deviators  Stand with a ____________________ weight in your  right / left hand, or sit holding on to the rubber exercise band/tubing with your arm supported.  Raise your hand upward in front of you or pull up on the rubber tubing.  Hold this position for __________ seconds and then slowly lower the wrist back to the starting position. Repeat __________ times. Complete this exercise __________ times per day. STRENGTH  Forearm Supinators  Sit with your right / left forearm supported on a table, keeping your elbow below shoulder height. Rest your hand over the edge, palm down.  Gently grip a hammer or a soup ladle.  Without moving your elbow, slowly turn your palm and hand upward to a "thumbs-up" position.  Hold this position for __________ seconds. Slowly return to the starting position. Repeat __________ times. Complete this exercise __________ times per day.  STRENGTH  Forearm Pronators  Sit with your right / left forearm supported on a table, keeping your elbow below shoulder height. Rest your hand over the edge, palm up.  Gently grip a hammer or a soup ladle.  Without moving your elbow, slowly turn your palm and hand upward to a "thumbs-up" position.  Hold this position for __________ seconds. Slowly return to the starting position. Repeat __________ times. Complete this exercise __________ times per day.  STRENGTH - Grip  Grasp a tennis ball, a dense sponge, or a large, rolled sock in your hand.  Squeeze as hard as you can without increasing any pain.  Hold this position for __________ seconds. Release your grip slowly. Repeat __________ times. Complete this exercise __________ times per day.  Document Released: 07/14/2005 Document Revised: 10/06/2011  Document Reviewed: 10/26/2008 [facility] Patient Information 2014 [place], [place].

## 2013-12-16 NOTE — ED Provider Notes (Signed)
Medical screening examination/treatment/procedure(s) were performed by non-physician practitioner and as supervising physician I was immediately available for consultation/collaboration.   EKG Interpretation None        Aleeya Veitch J. Aniah Pauli, MD 12/16/13 1707 

## 2013-12-20 ENCOUNTER — Telehealth: Payer: Self-pay | Admitting: *Deleted

## 2013-12-20 NOTE — Telephone Encounter (Signed)
Spoke with patient, informed her of VT episode 5/20 @ 12:26am , treated with ATP x1.  Patient was advised not to drive for 6 months.  Follow up as scheduled.

## 2014-01-06 ENCOUNTER — Encounter: Payer: BC Managed Care – PPO | Admitting: Cardiology

## 2014-01-10 ENCOUNTER — Other Ambulatory Visit: Payer: Self-pay | Admitting: Internal Medicine

## 2014-01-10 ENCOUNTER — Encounter: Payer: Self-pay | Admitting: Internal Medicine

## 2014-01-10 ENCOUNTER — Encounter: Payer: Self-pay | Admitting: Cardiology

## 2014-01-10 ENCOUNTER — Ambulatory Visit (INDEPENDENT_AMBULATORY_CARE_PROVIDER_SITE_OTHER): Payer: BC Managed Care – PPO | Admitting: Cardiology

## 2014-01-10 VITALS — BP 92/64 | HR 76 | Resp 18 | Wt 240.8 lb

## 2014-01-10 DIAGNOSIS — Z4502 Encounter for adjustment and management of automatic implantable cardiac defibrillator: Secondary | ICD-10-CM

## 2014-01-10 DIAGNOSIS — I472 Ventricular tachycardia, unspecified: Secondary | ICD-10-CM

## 2014-01-10 DIAGNOSIS — I5022 Chronic systolic (congestive) heart failure: Secondary | ICD-10-CM

## 2014-01-10 DIAGNOSIS — I4729 Other ventricular tachycardia: Secondary | ICD-10-CM

## 2014-01-10 DIAGNOSIS — I493 Ventricular premature depolarization: Secondary | ICD-10-CM

## 2014-01-10 DIAGNOSIS — Z95810 Presence of automatic (implantable) cardiac defibrillator: Secondary | ICD-10-CM

## 2014-01-10 DIAGNOSIS — I428 Other cardiomyopathies: Secondary | ICD-10-CM

## 2014-01-10 DIAGNOSIS — I4949 Other premature depolarization: Secondary | ICD-10-CM

## 2014-01-10 NOTE — Progress Notes (Signed)
ELECTROPHYSIOLOGY OFFICE NOTE   Patient ID: Alison Howard MRN: [id number], DOB/AGE: 1960/12/19   Date of Visit: 01/10/2014  Primary Physician: Alison BlamerHARRIS, WILLIAM, MD Primary Cardiologist: Alison MountSteve Klein, MD  Reason for Visit: EP/device follow-up  History of Present Illness  Alison Howard is a 53 y.o. female with a presumed NICM >5 years in duration, LBBB, [facility] [place] and hypothyroidism who is followed by Dr. Graciela HusbandsKlein. Up-titration of her medication regimen has been limited by hypotension and fatigue. She presents today for ICD follow-up after recent remote monitoring revealed tachy therapies delivered for VT.    Since last being seen in our clinic, she reports she is doing well and has no complaints. She has chronic fatigue which improved off spironolactone and higher doses of BB. She denies any worsening. She denies chest pain or shortness of breath. She denies palpitations, dizziness, near syncope or syncope. She denies LE swelling, orthopnea, PND or recent weight gain. She denies ICD shocks. She is compliant with medications.   Past Medical History Past Medical History  Diagnosis Date  . NICM (nonischemic cardiomyopathy)     dialted  . Hypercholesteremia   . LBBB (left bundle branch block)   . GERD (gastroesophageal reflux disease)   . Chronic systolic heart failure 04/25/2013  . Cardiomyopathy 04/25/2013  . Biventricular defibrillator- St. Jude 04/25/2013    DOI 2012     Past Surgical History Past Surgical History  Procedure Laterality Date  . Total abdominal hysterectomy    . Pacemaker placement      Allergies/Intolerances Allergies  Allergen Reactions  . Ciprofloxacin Hcl Anaphylaxis  . Codeine Anaphylaxis and Nausea And Vomiting  . Morphine And Related Anaphylaxis  . Sulfa Antibiotics Anaphylaxis    Current Home Medications Current Outpatient Prescriptions  Medication Sig Dispense Refill  . acetaminophen (TYLENOL) 500 MG tablet Take 1,000 mg by  mouth daily as needed for mild pain.      . carvedilol (COREG) 12.5 MG tablet Take 12.5 mg by mouth daily.      . citalopram (CELEXA) 40 MG tablet Take 40 mg by mouth daily.      Marland Kitchen. levothyroxine (SYNTHROID, LEVOTHROID) 25 MCG tablet Take 25 mcg by mouth daily before breakfast.      . pantoprazole (PROTONIX) 40 MG tablet Take 40 mg by mouth daily.      . valsartan (DIOVAN) 80 MG tablet Take 80 mg by mouth daily.      . naproxen (NAPROSYN) 375 MG tablet Take 1 tablet (375 mg total) by mouth 2 (two) times daily.  20 tablet  0  . traMADol (ULTRAM) 50 MG tablet Take 1 tablet (50 mg total) by mouth every 6 (six) hours as needed.  15 tablet  0   No current facility-administered medications for this visit.    Social History History   Social History  . Marital Status: Divorced    Spouse Name: N/A    Number of Children: 0  . Years of Education: N/A   Occupational History  . Not on file.   Social History Main Topics  . Smoking status: Never Smoker   . Smokeless tobacco: Not on file  . Alcohol Use: Yes  . Drug Use: No  . Sexual Activity: Not on file   Other Topics Concern  . Not on file   Social History Narrative  . No narrative on file     Review of Systems General: No chills, fever, night sweats or weight changes  Cardiovascular: No chest pain, dyspnea on exertion, edema, orthopnea, palpitations, paroxysmal nocturnal dyspnea Dermatological: No rash, lesions or masses Respiratory: No cough, dyspnea Urologic: No hematuria, dysuria Abdominal: No nausea, vomiting, diarrhea, bright red blood per rectum, melena, or hematemesis Neurologic: No visual changes, weakness, changes in mental status All other systems reviewed and are otherwise negative except as noted above.  Physical Exam Vitals: Blood pressure 92/64, pulse 76, resp. rate 18, weight 240 lb 12.8 oz (109.226 kg).  General: Well developed, well appearing 53 y.o. female in no acute distress. HEENT: Normocephalic, atraumatic.  EOMs intact. Sclera nonicteric. Oropharynx clear.  Neck: Supple. No JVD. Lungs: Respirations regular and unlabored, CTA bilaterally. No wheezes, rales or rhonchi. Heart: RRR. S1, S2 present. No murmurs, rub, S3 or S4. Abdomen: Soft, non-distended.  Extremities: No clubbing, cyanosis or edema. PT/Radials 2+ and equal bilaterally. Psych: Normal affect. Neuro: Alert and oriented X 3. Moves all extremities spontaneously.   Diagnostics  Echocardiogram Dec 2014 Study Conclusions - Left ventricle: The cavity size was mildly dilated. Wall thickness was normal. Systolic function was mildly to moderately reduced. The estimated ejection fraction was in the range of 40% to 45%. Diffuse hypokinesis. Doppler parameters are consistent with abnormal left ventricular relaxation (grade 1 diastolic dysfunction). - Pulmonary arteries: PA peak pressure: 70mm Hg (S).  Device interrogation today - Thresholds and sensing consistent with previous device measurements. Lead impedance trends stable over time. 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 Patient bi-ventricularly pacing 88% of the time with known h/o frequent PVCs (up-titration of BB has been limited by hypotension). Device programmed with appropriate safety margins. Heart failure diagnostics reviewed and trends are stable for patient. No changes made this session. Estimated longevity 3.2 - 3.5 years.   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 presents for EP follow-up after remote CRT-D interrogation revealed tachy therapies were delivered appropriately for VT on 12/14/2013. VT was successfully terminated with ATPx1. She was asymptomatic. She has never received tachy therapies before. She has frequent PVCs and she is BiV paced 88% of the time which is not new for her. However,  up-titration of her medication regimen has been limited by hypotension and fatigue. There were no programming changes made today. At this time, I do not feel AAD therapy is indicated. She is feeling well without worsening CHF symptoms. There were no changes made to her medication regimen. She was instructed not to drive for 6 months until given clearance by Dr. Graciela Husbands. She agrees to comply. She will continue routine remote CRT-D follow-up every 3 months. She will see Dr. Graciela Husbands in 6 months unless needed sooner if she develops recurrent VT.  Signed, Rick Duff, PA-C 01/10/2014, 10:45 AM

## 2014-01-11 ENCOUNTER — Telehealth: Payer: Self-pay | Admitting: Internal Medicine

## 2014-01-11 NOTE — Telephone Encounter (Signed)
She cannot drive for 6 months, until given clearance by Dr. Graciela Husbands. Thank so much for your help! -[name]

## 2014-01-11 NOTE — Telephone Encounter (Signed)
New message     Can pt drive?  Someone was going to review her 5-20 pacemaker reading and decide if she can now drive.

## 2014-01-11 NOTE — Telephone Encounter (Signed)
Had Dr. Graciela Husbands review interrogation -- he confirmed VT.  Per [name] patient no driving. Called pt and informed her of recommendations and she is requesting to know how long she is restricted from driving.  I will forward to [place] to address. I will call pt back with recommendations. Pt is agreeable to this.

## 2014-01-12 NOTE — Telephone Encounter (Signed)
Advised patient who verbalized understanding.

## 2014-01-13 ENCOUNTER — Encounter: Payer: Self-pay | Admitting: Cardiology

## 2014-01-16 LAB — MDC_IDC_ENUM_SESS_TYPE_INCLINIC
Battery Remaining Longevity: 38.4 mo
Brady Statistic RA Percent Paced: 0.32 %
Brady Statistic RV Percent Paced: 88 %
Date Time Interrogation Session: 20150616145010
HighPow Impedance: 49 Ohm
Implantable Pulse Generator Serial Number: 642812
Lead Channel Impedance Value: 400 Ohm
Lead Channel Impedance Value: 440 Ohm
Lead Channel Impedance Value: 860 Ohm
Lead Channel Pacing Threshold Amplitude: 0.75 V
Lead Channel Pacing Threshold Amplitude: 1 V
Lead Channel Pacing Threshold Amplitude: 1.5 V
Lead Channel Pacing Threshold Pulse Width: 0.5 ms
Lead Channel Pacing Threshold Pulse Width: 0.5 ms
Lead Channel Pacing Threshold Pulse Width: 0.5 ms
Lead Channel Sensing Intrinsic Amplitude: 3.5 mV
Lead Channel Sensing Intrinsic Amplitude: 9.6 mV
Lead Channel Setting Pacing Amplitude: 1.875
Lead Channel Setting Pacing Amplitude: 2 V
Lead Channel Setting Pacing Amplitude: 3.25 V
Lead Channel Setting Pacing Pulse Width: 0.5 ms
Lead Channel Setting Pacing Pulse Width: 0.5 ms
Lead Channel Setting Sensing Sensitivity: 0.5 mV
Zone Setting Detection Interval: 300 ms
Zone Setting Detection Interval: 330 ms
Zone Setting Detection Interval: 370 ms

## 2014-02-11 ENCOUNTER — Other Ambulatory Visit: Payer: Self-pay | Admitting: Internal Medicine

## 2014-03-08 ENCOUNTER — Other Ambulatory Visit: Payer: Self-pay | Admitting: Internal Medicine

## 2014-03-22 ENCOUNTER — Encounter: Payer: Self-pay | Admitting: Internal Medicine

## 2014-03-29 ENCOUNTER — Encounter: Payer: Self-pay | Admitting: Internal Medicine

## 2014-04-13 ENCOUNTER — Encounter: Payer: Self-pay | Admitting: Internal Medicine

## 2014-04-13 ENCOUNTER — Ambulatory Visit (INDEPENDENT_AMBULATORY_CARE_PROVIDER_SITE_OTHER): Payer: BC Managed Care – PPO | Admitting: *Deleted

## 2014-04-13 DIAGNOSIS — I5022 Chronic systolic (congestive) heart failure: Secondary | ICD-10-CM

## 2014-04-13 DIAGNOSIS — I428 Other cardiomyopathies: Secondary | ICD-10-CM

## 2014-04-13 LAB — MDC_IDC_ENUM_SESS_TYPE_REMOTE
Battery Remaining Longevity: 39 mo
Battery Remaining Percentage: 49 %
Battery Voltage: 2.92 V
Brady Statistic AP VP Percent: 3.5 %
Brady Statistic AP VS Percent: 1 %
Brady Statistic AS VP Percent: 85 %
Brady Statistic AS VS Percent: 6.4 %
Brady Statistic RA Percent Paced: 1 %
Date Time Interrogation Session: 20150917061156
HighPow Impedance: 53 Ohm
Implantable Pulse Generator Serial Number: 642812
Lead Channel Impedance Value: 400 Ohm
Lead Channel Impedance Value: 460 Ohm
Lead Channel Impedance Value: 960 Ohm
Lead Channel Pacing Threshold Amplitude: 0.75 V
Lead Channel Pacing Threshold Amplitude: 0.875 V
Lead Channel Pacing Threshold Amplitude: 1.375 V
Lead Channel Pacing Threshold Pulse Width: 0.5 ms
Lead Channel Pacing Threshold Pulse Width: 0.5 ms
Lead Channel Pacing Threshold Pulse Width: 0.5 ms
Lead Channel Sensing Intrinsic Amplitude: 11.6 mV
Lead Channel Sensing Intrinsic Amplitude: 3.7 mV
Lead Channel Setting Pacing Amplitude: 1.75 V
Lead Channel Setting Pacing Amplitude: 2 V
Lead Channel Setting Pacing Amplitude: 2.375
Lead Channel Setting Pacing Pulse Width: 0.5 ms
Lead Channel Setting Pacing Pulse Width: 0.5 ms
Lead Channel Setting Sensing Sensitivity: 0.5 mV
Zone Setting Detection Interval: 300 ms
Zone Setting Detection Interval: 330 ms
Zone Setting Detection Interval: 370 ms

## 2014-04-13 NOTE — Progress Notes (Signed)
Remote ICD transmission.   

## 2014-05-01 ENCOUNTER — Encounter: Payer: Self-pay | Admitting: Cardiology

## 2014-05-10 ENCOUNTER — Encounter: Payer: Self-pay | Admitting: Internal Medicine

## 2014-05-17 ENCOUNTER — Other Ambulatory Visit: Payer: Self-pay | Admitting: Internal Medicine

## 2014-07-06 ENCOUNTER — Encounter: Payer: Self-pay | Admitting: Internal Medicine

## 2014-07-06 ENCOUNTER — Ambulatory Visit (INDEPENDENT_AMBULATORY_CARE_PROVIDER_SITE_OTHER): Payer: BC Managed Care – PPO | Admitting: Internal Medicine

## 2014-07-06 VITALS — BP 102/80 | HR 82 | Ht 63.0 in | Wt 240.6 lb

## 2014-07-06 DIAGNOSIS — Z95810 Presence of automatic (implantable) cardiac defibrillator: Secondary | ICD-10-CM

## 2014-07-06 DIAGNOSIS — I5022 Chronic systolic (congestive) heart failure: Secondary | ICD-10-CM

## 2014-07-06 DIAGNOSIS — I429 Cardiomyopathy, unspecified: Secondary | ICD-10-CM

## 2014-07-06 LAB — MDC_IDC_ENUM_SESS_TYPE_INCLINIC
Battery Remaining Longevity: 40.8 mo
Brady Statistic RA Percent Paced: 0.61 %
Brady Statistic RV Percent Paced: 90 %
Date Time Interrogation Session: 20151210101821
HighPow Impedance: 48 Ohm
Implantable Pulse Generator Serial Number: 642812
Lead Channel Impedance Value: 450 Ohm
Lead Channel Impedance Value: 462.5 Ohm
Lead Channel Impedance Value: 887.5 Ohm
Lead Channel Pacing Threshold Amplitude: 0.75 V
Lead Channel Pacing Threshold Amplitude: 0.75 V
Lead Channel Pacing Threshold Amplitude: 1.5 V
Lead Channel Pacing Threshold Pulse Width: 0.5 ms
Lead Channel Pacing Threshold Pulse Width: 0.5 ms
Lead Channel Pacing Threshold Pulse Width: 0.5 ms
Lead Channel Sensing Intrinsic Amplitude: 12 mV
Lead Channel Sensing Intrinsic Amplitude: 3.6 mV
Lead Channel Setting Pacing Amplitude: 1.75 V
Lead Channel Setting Pacing Amplitude: 2 V
Lead Channel Setting Pacing Amplitude: 2.5 V
Lead Channel Setting Pacing Pulse Width: 0.5 ms
Lead Channel Setting Pacing Pulse Width: 0.5 ms
Lead Channel Setting Sensing Sensitivity: 0.5 mV
Zone Setting Detection Interval: 300 ms
Zone Setting Detection Interval: 330 ms
Zone Setting Detection Interval: 370 ms

## 2014-07-06 LAB — CBC WITH DIFFERENTIAL/PLATELET
Basophils Absolute: 0.1 10*3/uL (ref 0.0–0.1)
Basophils Relative: 0.7 % (ref 0.0–3.0)
Eosinophils Absolute: 0.4 10*3/uL (ref 0.0–0.7)
Eosinophils Relative: 4.3 % (ref 0.0–5.0)
HCT: 43.2 % (ref 36.0–46.0)
Hemoglobin: 14.3 g/dL (ref 12.0–15.0)
Lymphocytes Relative: 29.7 % (ref 12.0–46.0)
Lymphs Abs: 2.8 10*3/uL (ref 0.7–4.0)
MCHC: 33.1 g/dL (ref 30.0–36.0)
MCV: 90.5 fl (ref 78.0–100.0)
Monocytes Absolute: 0.8 10*3/uL (ref 0.1–1.0)
Monocytes Relative: 8.3 % (ref 3.0–12.0)
Neutro Abs: 5.3 10*3/uL (ref 1.4–7.7)
Neutrophils Relative %: 57 % (ref 43.0–77.0)
Platelets: 341 10*3/uL (ref 150.0–400.0)
RBC: 4.77 Mil/uL (ref 3.87–5.11)
RDW: 13.4 % (ref 11.5–15.5)
WBC: 9.3 10*3/uL (ref 4.0–10.5)

## 2014-07-06 LAB — BASIC METABOLIC PANEL
BUN: 17 mg/dL (ref 6–23)
CO2: 25 mEq/L (ref 19–32)
Calcium: 8.8 mg/dL (ref 8.4–10.5)
Chloride: 104 mEq/L (ref 96–112)
Creatinine, Ser: 0.8 mg/dL (ref 0.4–1.2)
GFR: 85.69 mL/min (ref >60.00)
Glucose, Bld: 97 mg/dL (ref 70–99)
Potassium: 3.9 mEq/L (ref 3.5–5.1)
Sodium: 136 mEq/L (ref 135–145)

## 2014-07-06 LAB — BRAIN NATRIURETIC PEPTIDE: Pro B Natriuretic peptide (BNP): 37 pg/mL (ref 0.0–100.0)

## 2014-07-06 LAB — TSH: TSH: 3.95 u[IU]/mL (ref 0.35–4.50)

## 2014-07-06 MED ORDER — METOPROLOL SUCCINATE ER 50 MG PO TB24: ORAL_TABLET | ORAL | Status: DC

## 2014-07-06 MED ORDER — BISOPROLOL FUMARATE 5 MG PO TABS: ORAL_TABLET | ORAL | Status: DC

## 2014-07-06 NOTE — Progress Notes (Signed)
Patient Care Team: Johny BlamerWilliam Harris, MD as PCP - General (Family Medicine)   HPI  Alison CabotJudy H Howard is a 53 y.o. female Se[facility]ollowup [facility] for cardiomyopathy presumed nonischemic of >5 yrs duration   She was seen in 6/15 following appropriate therapies for VT-ATP   There were no associated symptoms  She is noted increasing shortness of breath and exercise intolerance. She denies peripheral edema.  She has sleep apnea but only on her back so she does not use CPAP  she has noted increasing fatigue. She awakens herself on occasion.  Recently started on Aldactone and post initiation potassium on 11/14 was 3.9;  BE is stopped the medication because of associated fatigue; she is better off of the medication   Her echo in 8/13 demonstrated ejection fraction of 40-45%./  Past Medical History  Diagnosis Date  . NICM (nonischemic cardiomyopathy)     dialted  . Hypercholesteremia   . LBBB (left bundle branch block)   . GERD (gastroesophageal reflux disease)   . Chronic systolic heart failure 04/25/2013  . Cardiomyopathy 04/25/2013  . 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 Total abdominal hysterectomy    . Pacemaker placement      Current Outpatient Prescriptions  Medication Sig Dispense Refill  . carvedilol (COREG) 12.5 MG tablet TAKE 1/2 TABLET BY MOUTH IN THE MORNING AND 1 TABLET IN THE EVENING 45 tablet 5  . citalopram (CELEXA) 40 MG tablet Take 40 mg by mouth daily.    Marland Kitchen. levothyroxine (SYNTHROID, LEVOTHROID) 25 MCG tablet Take 25 mcg by mouth daily before breakfast.    . pantoprazole (PROTONIX) 40 MG tablet Take 40 mg by mouth daily.    . valsartan (DIOVAN) 80 MG tablet TAKE 1 TABLET DAILY 90 tablet 3   No current facility-administered medications for this visit.    Allergies  Allergen Reactions  . Ciprofloxacin Hcl Anaphylaxis  . Codeine Anaphylaxis and Nausea And  Vomiting  . Morphine And Related Anaphylaxis  . Sulfa Antibiotics Anaphylaxis    Review of Systems negative except from HPI and PMH  Physical Exam BP 102/80 mmHg  Pulse 82  Ht 5\' 3"  (1.6 m)  Wt 240 lb 9.6 oz (109.135 kg)  BMI 42.63 kg/m2 Well developed and well nourished in no acute distress HENT normal E scleral and icterus clear Neck Supple JVP 7-8 cm ablation carotids brisk and full Clear to ausculation  Regular rate and rhythm, no murmurs gallops or rub Soft with active bowel sounds No clubbing cyanosis none Edema Alert and oriented, grossly normal motor and sensory function Skin Warm and Dry  ECG demonstrates AV biventricular pacing with 4/13 PVCs occurring with a left bundle inferior axix  Assessment and  Plan  Nonischemic cardiomyopathy  Congestive heart failure-chronic-systolic  PVCs  Sleep disordered breathing  Morbidly obese  Treated hypothyroidism  There is evidence of congestive symptoms albeit in the absence of peripheral edema. These are worsening. There is some jugulovenous distention. This could be related either to volume or to pulmonary hypertension. I am concerned about sleep apnea. I'm also concerned as to whether there is progressive LV dysfunction. She has a V sense count of about 8-10%. Some of these are PVCs according to the device i.e. without reports that the others are a sensed V sensed suggestive of either very closely coupled PVCs or more likely intrinsic conduction.  We  will reprogram the AV delay to make a dynamic  We will undertake an AV optimization echo both to look at the E /E' as well as LV function and AV optimization  We will repeat a sleep study and do it at home so that she can be assessed in her "natural habitat".  Her fatigue is also worsening and may also be alternatively related to her beta blockers. We will undertake exchange trial with bisoprolol 5/metoprolol succinate 50 to be taken at night.  We'll reassess these  things in about 8 weeks

## 2014-07-06 NOTE — Patient Instructions (Signed)
Your physician has recommended you make the following change in your medication:   ** You have been given 2 different beta blockers to try to replace the carvedilol that you have been taking. You may try them in any order. DO NOT take more than one at a time. 1) Bisoprolol 5 mg one tablet by mouth at bedtime 2) Metoprolol succinate 50 mg one tablet by mouth at bedtime  Your physician recommends that you have lab work today: TSH/ CBC/ BMET/ BNP  Your physician has recommended that you have an AV optimization echo (St. Jude). During this procedure, an echocardiogram is performed to optimize the timing of your device using ultrasound and a device programmer. Changes will be made to the device settings to help the heart chambers pump more efficiently. This procedure takes approximately one hour.  Your physician has recommended that you have a home sleep study. This test records several body functions during sleep, including: brain activity, eye movement, oxygen and carbon dioxide blood levels, heart rate and rhythm, breathing rate and rhythm, the flow of air through your mouth and nose, snoring, body muscle movements, and chest and belly movement.  Your physician recommends that you schedule a follow-up appointment in: 2 months with Dr. Graciela Husbands.

## 2014-07-18 ENCOUNTER — Ambulatory Visit (HOSPITAL_COMMUNITY): Payer: BC Managed Care – PPO | Attending: Cardiology

## 2014-07-18 ENCOUNTER — Ambulatory Visit (INDEPENDENT_AMBULATORY_CARE_PROVIDER_SITE_OTHER): Payer: BC Managed Care – PPO | Admitting: *Deleted

## 2014-07-18 DIAGNOSIS — E785 Hyperlipidemia, unspecified: Secondary | ICD-10-CM | POA: Diagnosis not present

## 2014-07-18 DIAGNOSIS — I5022 Chronic systolic (congestive) heart failure: Secondary | ICD-10-CM

## 2014-07-18 DIAGNOSIS — I447 Left bundle-branch block, unspecified: Secondary | ICD-10-CM | POA: Diagnosis not present

## 2014-07-18 DIAGNOSIS — Z95810 Presence of automatic (implantable) cardiac defibrillator: Secondary | ICD-10-CM | POA: Insufficient documentation

## 2014-07-18 DIAGNOSIS — I429 Cardiomyopathy, unspecified: Secondary | ICD-10-CM

## 2014-07-18 DIAGNOSIS — I509 Heart failure, unspecified: Secondary | ICD-10-CM | POA: Insufficient documentation

## 2014-07-18 LAB — MDC_IDC_ENUM_SESS_TYPE_INCLINIC
Battery Remaining Longevity: 3.4
Brady Statistic RA Percent Paced: 2.1 %
Brady Statistic RV Percent Paced: 94 %
HighPow Impedance: 46 Ohm
Implantable Pulse Generator Serial Number: 642812
Lead Channel Impedance Value: 400 Ohm
Lead Channel Impedance Value: 430 Ohm
Lead Channel Impedance Value: 930 Ohm
Lead Channel Pacing Threshold Amplitude: 0.875 V
Lead Channel Pacing Threshold Amplitude: 1.25 V
Lead Channel Pacing Threshold Amplitude: 1.625 V
Lead Channel Pacing Threshold Pulse Width: 0.5 ms
Lead Channel Pacing Threshold Pulse Width: 0.5 ms
Lead Channel Pacing Threshold Pulse Width: 0.5 ms
Lead Channel Sensing Intrinsic Amplitude: 3.4 mV
Lead Channel Sensing Intrinsic Amplitude: 7.8 mV
Lead Channel Setting Pacing Amplitude: 1.75 V
Lead Channel Setting Pacing Amplitude: 2 V
Lead Channel Setting Pacing Amplitude: 2.5 V
Lead Channel Setting Pacing Pulse Width: 0.5 ms
Lead Channel Setting Pacing Pulse Width: 0.5 ms
Lead Channel Setting Sensing Sensitivity: 0.5 mV
Zone Setting Detection Interval: 300 ms
Zone Setting Detection Interval: 330 ms
Zone Setting Detection Interval: 370 ms

## 2014-07-18 NOTE — Progress Notes (Signed)
Echo AV opt (per [employer]//Sandie Sexton,STJ). Thresholds, sensing, and impedances within normal limits. Changes made this session---Rate responsive AVD changed from "High" to "Medium" w/shortest AVD 3ms, SAVD also decreased from to 64ms. No other changes made this session. Patient to follow up as scheduled.

## 2014-07-18 NOTE — Progress Notes (Signed)
2D Echo and AV optimization by Dr. Graciela Husbands completed. 07/18/2014

## 2014-08-11 ENCOUNTER — Other Ambulatory Visit: Payer: Self-pay | Admitting: Internal Medicine

## 2014-08-16 ENCOUNTER — Other Ambulatory Visit: Payer: Self-pay | Admitting: *Deleted

## 2014-08-16 DIAGNOSIS — I429 Cardiomyopathy, unspecified: Secondary | ICD-10-CM

## 2014-08-16 DIAGNOSIS — I5022 Chronic systolic (congestive) heart failure: Secondary | ICD-10-CM

## 2014-08-16 MED ORDER — LEVOTHYROXINE SODIUM 25 MCG PO TABS: 25.0000 ug | ORAL_TABLET | Freq: Every day | ORAL | Status: DC

## 2014-08-16 MED ORDER — VALSARTAN 80 MG PO TABS: 80.0000 mg | ORAL_TABLET | Freq: Every day | ORAL | Status: DC

## 2014-08-16 MED ORDER — BISOPROLOL FUMARATE 5 MG PO TABS
ORAL_TABLET | ORAL | Status: DC
Start: 2014-08-16 — End: 2014-10-21

## 2014-08-28 ENCOUNTER — Encounter: Payer: Self-pay | Admitting: Internal Medicine

## 2014-08-29 ENCOUNTER — Telehealth: Payer: Self-pay | Admitting: Internal Medicine

## 2014-08-29 NOTE — Telephone Encounter (Signed)
New Msg       Miranda calling from [facility], states order was received for home sleep study but they need symptoms to be reported.   Is pt snoring? Daytime sleepiness? These symptoms are necessary.   Please call Miranda at [phone] vm is secure so info may be left on vm.

## 2014-08-29 NOTE — Telephone Encounter (Signed)
Left detailed message on secure vm. Dx: G47.8, R53.83, G47.30

## 2014-09-05 ENCOUNTER — Ambulatory Visit (INDEPENDENT_AMBULATORY_CARE_PROVIDER_SITE_OTHER): Payer: BLUE CROSS/BLUE SHIELD | Admitting: Internal Medicine

## 2014-09-05 ENCOUNTER — Encounter: Payer: Self-pay | Admitting: Internal Medicine

## 2014-09-05 VITALS — BP 120/92 | HR 107 | Ht 63.0 in | Wt 241.2 lb

## 2014-09-05 DIAGNOSIS — I429 Cardiomyopathy, unspecified: Secondary | ICD-10-CM | POA: Diagnosis not present

## 2014-09-05 DIAGNOSIS — Z4502 Encounter for adjustment and management of automatic implantable cardiac defibrillator: Secondary | ICD-10-CM

## 2014-09-05 DIAGNOSIS — I428 Other cardiomyopathies: Secondary | ICD-10-CM

## 2014-09-05 DIAGNOSIS — I5022 Chronic systolic (congestive) heart failure: Secondary | ICD-10-CM

## 2014-09-05 LAB — MDC_IDC_ENUM_SESS_TYPE_INCLINIC
Battery Remaining Longevity: 38.4 mo
Brady Statistic RA Percent Paced: 1.9 %
Brady Statistic RV Percent Paced: 91 %
Date Time Interrogation Session: 20160209173258
HighPow Impedance: 51.3105
Implantable Pulse Generator Serial Number: 642812
Lead Channel Impedance Value: 400 Ohm
Lead Channel Impedance Value: 462.5 Ohm
Lead Channel Impedance Value: 950 Ohm
Lead Channel Pacing Threshold Amplitude: 0.75 V
Lead Channel Pacing Threshold Amplitude: 0.75 V
Lead Channel Pacing Threshold Amplitude: 1.5 V
Lead Channel Pacing Threshold Pulse Width: 0.5 ms
Lead Channel Pacing Threshold Pulse Width: 0.5 ms
Lead Channel Pacing Threshold Pulse Width: 0.5 ms
Lead Channel Sensing Intrinsic Amplitude: 12 mV
Lead Channel Sensing Intrinsic Amplitude: 5 mV
Lead Channel Setting Pacing Amplitude: 1.75 V
Lead Channel Setting Pacing Amplitude: 2 V
Lead Channel Setting Pacing Amplitude: 2.5 V
Lead Channel Setting Pacing Pulse Width: 0.5 ms
Lead Channel Setting Pacing Pulse Width: 0.5 ms
Lead Channel Setting Sensing Sensitivity: 0.5 mV
Zone Setting Detection Interval: 300 ms
Zone Setting Detection Interval: 330 ms
Zone Setting Detection Interval: 370 ms

## 2014-09-05 MED ORDER — MEXILETINE HCL 200 MG PO CAPS: 200.0000 mg | ORAL_CAPSULE | Freq: Two times a day (BID) | ORAL | Status: DC

## 2014-09-05 NOTE — Patient Instructions (Signed)
Your physician has recommended you make the following change in your medication:  1. Increase Bisoprolol to 10 mg once a day.  2. Start Mexiletine 200 mg twice a day.  Remote monitoring is used to monitor your Pacemaker of ICD from home. This monitoring reduces the number of office visits required to check your device to one time per year. It allows [place] to keep an eye on the functioning of your device to ensure it is working properly. You are scheduled for a device check from home on 09/26/14. You may send your transmission at any time that day. If you have a wireless device, the transmission will be sent automatically. After your physician reviews your transmission, you will receive a postcard with your next transmission date.   Your physician recommends that you schedule a follow-up appointment in: 6 months with Dr. Graciela Husbands  Thank you for visiting with [facility] today!!

## 2014-09-05 NOTE — Progress Notes (Signed)
Patient Care Team: Johny Blamer, MD as PCP - General (Family Medicine)   HPI  Alison Howard is a 54 y.o. female Seen infollowup for CRT-D implanted at [facility] for cardiomyopathy presumed nonischemic of >5 yrs duration   She was seen in 6/15 following appropriate therapies for VT-ATP   There were no associated symptoms  She is noted increasing shortness of breath and exercise intolerance. She denies peripheral edema.  She has sleep apnea but only on her back so she does not use CPAP  she has noted increasing fatigue. She awakens herself on occasion.  Recently started on Aldactone and post initiation potassium on 11/14 was 3.9;  BE is stopped the medication because of associated fatigue; she is better off of the medication   Her echo in 8/13 demonstrated ejection fraction of 40-45%. Review of serial echoes 12/14 was 40-45% and most recently was 25-30%  We reviewed the PVC/BiV pacing burden which has progressively decreased over the last coulple of years  We tried her on different betablockers and metop and bystolic were assoc with less sleep disturbance  Past Medical History  Diagnosis Date  . NICM (nonischemic cardiomyopathy)     dialted  . Hypercholesteremia   . LBBB (left bundle branch block)   . GERD (gastroesophageal reflux disease)   . Chronic systolic heart failure 04/25/2013  . Cardiomyopathy 04/25/2013  . 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 Total abdominal hysterectomy    . Pacemaker placement      Current Outpatient Prescriptions  Medication Sig Dispense Refill  . citalopram (CELEXA) 40 MG tablet Take 40 mg by mouth daily.    Marland Kitchen levothyroxine (SYNTHROID, LEVOTHROID) 25 MCG tablet Take 1 tablet (25 mcg total) by mouth daily before breakfast. 30 tablet 1  . pantoprazole (PROTONIX) 40 MG tablet Take 40 mg by mouth daily.    . valsartan (DIOVAN) 80 MG tablet Take 1 tablet (80 mg  total) by mouth daily. 30 tablet 1  . bisoprolol (ZEBETA) 5 MG tablet Take one tablet (5 mg) by mouth at bedtime 30 tablet 1  . metoprolol succinate (TOPROL-XL) 50 MG 24 hr tablet Take one tablet (50 mg) by mouth at night 30 tablet 0   No current facility-administered medications for this visit.    Allergies  Allergen Reactions  . Ciprofloxacin Hcl Anaphylaxis  . Codeine Anaphylaxis and Nausea And Vomiting  . Morphine And Related Anaphylaxis  . Sulfa Antibiotics Anaphylaxis    Review of Systems negative except from HPI and PMH  Physical Exam BP 120/92 mmHg  Pulse 107  Ht 5\' 3"  (1.6 m)  Wt 241 lb 3.2 oz (109.408 kg)  BMI 42.74 kg/m2 Well developed and well nourished in no acute distress HENT normal E scleral and icterus clear Neck Supple JVP 7-8 cm ablation carotids brisk and full Clear to ausculation  Regular rate and rhythm, no murmurs gallops or rub Soft with active bowel sounds No clubbing cyanosis none Edema Alert and oriented, grossly normal motor and sensory function Skin Warm and Dry  ECG demonstrates AV biventricular pacing with 4/13 PVCs occurring with a left bundle inferior axix  Assessment and  Plan  Nonischemic cardiomyopathy  Congestive heart failure-chronic-systolic  PVCs  Sleep disordered breathing  Morbidly obese  Treated hypothyroidism  The change in LV function has been progressive over recent years from 40-45->>25-30%. The ventricular pacing percentage seems the creation  suggesting that the PVC burden is concurrent with this change.  Unfortunately this does not inform the cause effect relationship. Hence, we will undertake antiarrhythmic therapy initially with mexiletine and with amiodarone if necessary to try to suppress her ventricular ectopy and see if thereby we get improvement in LV systolic function. If that were the case, we would then pursue catheter ablation consideration.  Her heart failure is stable. She is euvolemic. There is some  small improvement following AV optimization sleep study is pending   We spent more than 50% of our >45 min visit in face to face counseling regarding the above

## 2014-09-06 ENCOUNTER — Telehealth: Payer: Self-pay | Admitting: Internal Medicine

## 2014-09-06 NOTE — Telephone Encounter (Signed)
New problem    Wanting to know status of auth form that was faxed over 08/31/14 for Dr Graciela Husbands to sign. Please advise.

## 2014-09-14 NOTE — Telephone Encounter (Signed)
She was trying to fax information to the [place] office fax, but it wouldn't go through. Gave her [facility] fax number and she will send to me to address.

## 2014-10-05 ENCOUNTER — Telehealth: Payer: Self-pay | Admitting: Internal Medicine

## 2014-10-05 NOTE — Telephone Encounter (Signed)
New message   Pt c/o medication issue:  1. Name of Medication:mexiletine 200 mg   2. How are you currently taking this medication (dosage and times per day)? First time taken today .   3. Are you having a reaction (difficulty breathing--STAT)? I side effect - itching.   4. What is your medication issue? Discuss  Medication

## 2014-10-06 NOTE — Telephone Encounter (Signed)
F/U ° ° ° ° ° °Pt returning call from yesterday. ° ° ° °Please call. °

## 2014-10-06 NOTE — Telephone Encounter (Signed)
Advised to stop medication for one week.  (informed patient Dr. Graciela Husbands does not think related to medication, but we will hold med to see) Restart after week and call office if rash reappears. Patient verbalized understanding and agreeable to plan.

## 2014-10-20 ENCOUNTER — Telehealth: Payer: Self-pay | Admitting: *Deleted

## 2014-10-20 DIAGNOSIS — Z79899 Other long term (current) drug therapy: Secondary | ICD-10-CM

## 2014-10-20 NOTE — Telephone Encounter (Signed)
Patient agreed to come by office on 4/8 for BMET. She will call office if she needs to reschedule - as she is starting a new job next week and may be in [place] the following week for training.

## 2014-10-20 NOTE — Telephone Encounter (Signed)
Home sleep study fax form sent

## 2014-10-20 NOTE — Telephone Encounter (Signed)
Fransico Setters, RN  Baird Lyons, RN           [facility]- I have some sleep study orders to fax for this patient  Dr. Graciela Husbands put a post it note saying patient needs a BMP  I can not schedule her in [place] and that is where she is seen  Can you set this up?     Thanks!!!!!!!!!!!!!!

## 2014-10-21 ENCOUNTER — Other Ambulatory Visit: Payer: Self-pay | Admitting: Internal Medicine

## 2014-10-25 ENCOUNTER — Other Ambulatory Visit: Payer: Self-pay | Admitting: Internal Medicine

## 2014-11-03 ENCOUNTER — Other Ambulatory Visit: Payer: BLUE CROSS/BLUE SHIELD

## 2014-11-06 ENCOUNTER — Other Ambulatory Visit (INDEPENDENT_AMBULATORY_CARE_PROVIDER_SITE_OTHER): Payer: BLUE CROSS/BLUE SHIELD | Admitting: *Deleted

## 2014-11-06 DIAGNOSIS — Z79899 Other long term (current) drug therapy: Secondary | ICD-10-CM

## 2014-11-06 LAB — BASIC METABOLIC PANEL
BUN: 9 mg/dL (ref 6–23)
CO2: 30 mEq/L (ref 19–32)
Calcium: 9 mg/dL (ref 8.4–10.5)
Chloride: 104 mEq/L (ref 96–112)
Creatinine, Ser: 0.76 mg/dL (ref 0.40–1.20)
GFR: 84.28 mL/min (ref >60.00)
Glucose, Bld: 84 mg/dL (ref 70–99)
Potassium: 3.9 mEq/L (ref 3.5–5.1)
Sodium: 139 mEq/L (ref 135–145)

## 2014-12-05 ENCOUNTER — Encounter: Payer: Self-pay | Admitting: Internal Medicine

## 2014-12-05 ENCOUNTER — Ambulatory Visit (INDEPENDENT_AMBULATORY_CARE_PROVIDER_SITE_OTHER): Payer: BLUE CROSS/BLUE SHIELD | Admitting: *Deleted

## 2014-12-05 DIAGNOSIS — I5022 Chronic systolic (congestive) heart failure: Secondary | ICD-10-CM | POA: Diagnosis not present

## 2014-12-05 DIAGNOSIS — I429 Cardiomyopathy, unspecified: Secondary | ICD-10-CM

## 2014-12-05 DIAGNOSIS — I428 Other cardiomyopathies: Secondary | ICD-10-CM

## 2014-12-05 NOTE — Progress Notes (Signed)
Remote ICD transmission.   

## 2014-12-08 LAB — CUP PACEART REMOTE DEVICE CHECK
Battery Remaining Longevity: 36 mo
Battery Remaining Percentage: 40 %
Battery Voltage: 2.9 V
Brady Statistic AP VP Percent: 8.1 %
Brady Statistic AP VS Percent: 1 %
Brady Statistic AS VP Percent: 83 %
Brady Statistic AS VS Percent: 3.2 %
Brady Statistic RA Percent Paced: 3.7 %
Date Time Interrogation Session: 20160510073736
HighPow Impedance: 52 Ohm
Lead Channel Impedance Value: 440 Ohm
Lead Channel Impedance Value: 450 Ohm
Lead Channel Impedance Value: 930 Ohm
Lead Channel Pacing Threshold Amplitude: 0.75 V
Lead Channel Pacing Threshold Amplitude: 0.875 V
Lead Channel Pacing Threshold Amplitude: 1.5 V
Lead Channel Pacing Threshold Pulse Width: 0.5 ms
Lead Channel Pacing Threshold Pulse Width: 0.5 ms
Lead Channel Pacing Threshold Pulse Width: 0.5 ms
Lead Channel Sensing Intrinsic Amplitude: 11.8 mV
Lead Channel Sensing Intrinsic Amplitude: 3.9 mV
Lead Channel Setting Pacing Amplitude: 1.75 V
Lead Channel Setting Pacing Amplitude: 2 V
Lead Channel Setting Pacing Amplitude: 2.5 V
Lead Channel Setting Pacing Pulse Width: 0.5 ms
Lead Channel Setting Pacing Pulse Width: 0.5 ms
Lead Channel Setting Sensing Sensitivity: 0.5 mV
Pulse Gen Serial Number: 642812
Zone Setting Detection Interval: 250 ms
Zone Setting Detection Interval: 300 ms
Zone Setting Detection Interval: 370 ms

## 2014-12-18 ENCOUNTER — Encounter: Payer: Self-pay | Admitting: Cardiology

## 2014-12-27 NOTE — Progress Notes (Signed)
Cardiology Office Note   Date:  12/28/2014   ID:  Alison Howard, DOB Mar 10, 1961, MRN [id number]  PCP:  Johny Blamer, MD    Chief Complaint  Patient presents with  . Follow-up    abnormal sleep study      History of Present Illness: Alison Howard is a 54 y.o. female who presents for evaluation of sleep apnea.  She saw Dr. Graciela Husbands and mentioned that she was always tired during the day.  She had a sleep study in the past and was told she had sleep apne when sleeping on her back but no significant oxygen desats.  She occasionally will wake herself up snoring.  She sleeps on her sides and belly.  She goes to bed between 10-12MN and falls asleep within 20 minutes.  She gets up at 7am.  She avoids sleeping on her back due to snoring and cannot breath.  The patient recently had a home sleep study done which showed an average AHI of 27 event per hour over 3 nights with the highest AHI being 40 events per hour.  Her lowest oxygen saturation was 77%.  This is consistent with severe obstructive sleep apnea.  She does not get any aerobic exercise except for walking the dog.     Past Medical History  Diagnosis Date  . NICM (nonischemic cardiomyopathy)     dialted  . Hypercholesteremia   . LBBB (left bundle branch block)   . GERD (gastroesophageal reflux disease)   . Chronic systolic heart failure 04/25/2013  . Cardiomyopathy 04/25/2013  . Biventricular defibrillator- St. Jude 04/25/2013    DOI 2012   . OSA (obstructive sleep apnea) 12/28/2014    Severe with AHI 27 events per hour on average and the highest AHI was 40 events per hour  . Morbid obesity 12/28/2014    Past Surgical History  Procedure Laterality Date  . Total abdominal hysterectomy    . Pacemaker placement       Current Outpatient Prescriptions  Medication Sig Dispense Refill  . bisoprolol (ZEBETA) 10 MG tablet Take 1 tablet (10 mg total) by mouth daily. 30 tablet 6  . citalopram (CELEXA) 40 MG tablet Take 40 mg  by mouth daily.    Marland Kitchen levothyroxine (SYNTHROID, LEVOTHROID) 25 MCG tablet Take 1 tablet (25 mcg total) by mouth daily before breakfast. 30 tablet 1  . metoprolol succinate (TOPROL-XL) 50 MG 24 hr tablet Take one tablet (50 mg) by mouth at night 30 tablet 0  . mexiletine (MEXITIL) 200 MG capsule Take 1 capsule (200 mg total) by mouth 2 (two) times daily. 90 capsule 1  . pantoprazole (PROTONIX) 40 MG tablet Take 40 mg by mouth daily.    . valsartan (DIOVAN) 80 MG tablet Take 1 tablet (80 mg total) by mouth daily. 30 tablet 1   No current facility-administered medications for this visit.    Allergies:   Ciprofloxacin hcl; Codeine; Morphine and related; and Sulfa antibiotics    Social History:  The patient  reports that she has never smoked. She does not have any smokeless tobacco history on file. She reports that she drinks alcohol. She reports that she does not use illicit drugs.   Family History:  The patient's family history includes Coronary artery disease in her mother; Heart attack in her mother; Heart disease in her father; Hyperlipidemia in her sister.    ROS:  Please see  the history of present illness.   Otherwise, review of systems are positive for none.   All other systems are reviewed and negative.    PHYSICAL EXAM: VS:  Pulse 62  Ht 5\' 3"  (1.6 m)  Wt 243 lb 12.8 oz (110.587 kg)  BMI 43.20 kg/m2  SpO2 97% , BMI Body mass index is 43.2 kg/(m^2). GEN: Well nourished, well developed, in no acute distress HEENT: normal Neck: no JVD, carotid bruits, or masses Cardiac: RRR; no murmurs, rubs, or gallops,no edema  Respiratory:  clear to auscultation bilaterally, normal work of breathing GI: soft, nontender, nondistended, + BS MS: no deformity or atrophy Skin: warm and dry, no rash Neuro:  Strength and sensation are intact Psych: euthymic mood, full affect   EKG:  EKG is not ordered today.    Recent Labs: 07/06/2014: Hemoglobin 14.3; Platelets 341.0; Pro B Natriuretic  peptide (BNP) 37.0; TSH 3.95 11/06/2014: BUN 9; Creatinine 0.76; Potassium 3.9; Sodium 139    Lipid Panel No results found for: CHOL, TRIG, HDL, CHOLHDL, VLDL, LDLCALC, LDLDIRECT    Wt Readings from Last 3 Encounters:  12/28/14 243 lb 12.8 oz (110.587 kg)  09/05/14 241 lb 3.2 oz (109.408 kg)  07/06/14 240 lb 9.6 oz (109.135 kg)       ASSESSMENT AND PLAN:  1.  Severe obstructive sleep apnea with ah AHI average over 3 nights of 27 events per hour and the highest AHI was 40 events per hour.  There were oxygen desaturations as low as 77%.  I have reviewed the findings of the sleep study with the patient and have recommended that we proceed with in lab CPAP titration. I have also instructed the patient on proper sleep hygiene, avoidance of sleeping in the supine position and avoidance of alcohol within 4 hours of bedtime.  The patient was also instructed to avoid driving if sleepy.   2.  Obesity - I have encouraged her to get into a routine exercise program and work on low fat diet with focus on small portions. 3.  Chronic systolic CHF - We discussed the role that sleep apnea plays in CHF.   Current medicines are reviewed at length with the patient today.  The patient does not have concerns regarding medicines.  The following changes have been made:  no change  Labs/ tests ordered today: See above Assessment and Plan No orders of the defined types were placed in this encounter.     Disposition:   FU with me 10 weeks after starting CPAP Signed, Quintella Reichert, MD  12/28/2014 9:29 AM    [facility] [address] [place], [place], [place]  [address] Phone: [phone]; Fax: [phone]

## 2014-12-28 ENCOUNTER — Ambulatory Visit (INDEPENDENT_AMBULATORY_CARE_PROVIDER_SITE_OTHER): Payer: BLUE CROSS/BLUE SHIELD | Admitting: Cardiology

## 2014-12-28 ENCOUNTER — Encounter: Payer: Self-pay | Admitting: Cardiology

## 2014-12-28 VITALS — HR 62 | Ht 63.0 in | Wt 243.8 lb

## 2014-12-28 DIAGNOSIS — I5022 Chronic systolic (congestive) heart failure: Secondary | ICD-10-CM

## 2014-12-28 DIAGNOSIS — G4733 Obstructive sleep apnea (adult) (pediatric): Secondary | ICD-10-CM | POA: Diagnosis not present

## 2014-12-28 HISTORY — DX: Obstructive sleep apnea (adult) (pediatric): G47.33

## 2014-12-28 HISTORY — DX: Morbid (severe) obesity due to excess calories: E66.01

## 2014-12-28 NOTE — Patient Instructions (Signed)
Medication Instructions:  Your physician recommends that you continue on your current medications as directed. Please refer to the Current Medication list given to you today.   Labwork: None  Testing/Procedures: Dr. Mayford Knife recommends you have a CPAP titration. You will be called to have this scheduled.  Follow-Up: After your titration is completed, you will be set up with [facility] to get your device. 10 weeks after you receive your PAP, you will have an office visit with Dr. Mayford Knife.  Any Other Special Instructions Will Be Listed Below (If Applicable).

## 2014-12-29 ENCOUNTER — Telehealth: Payer: Self-pay | Admitting: Cardiology

## 2014-12-29 ENCOUNTER — Ambulatory Visit (HOSPITAL_BASED_OUTPATIENT_CLINIC_OR_DEPARTMENT_OTHER): Payer: BLUE CROSS/BLUE SHIELD | Attending: Cardiology

## 2014-12-29 DIAGNOSIS — G4733 Obstructive sleep apnea (adult) (pediatric): Secondary | ICD-10-CM | POA: Diagnosis not present

## 2014-12-29 DIAGNOSIS — I4949 Other premature depolarization: Secondary | ICD-10-CM | POA: Insufficient documentation

## 2014-12-29 DIAGNOSIS — I493 Ventricular premature depolarization: Secondary | ICD-10-CM | POA: Insufficient documentation

## 2014-12-29 DIAGNOSIS — R0683 Snoring: Secondary | ICD-10-CM | POA: Insufficient documentation

## 2014-12-29 DIAGNOSIS — I5022 Chronic systolic (congestive) heart failure: Secondary | ICD-10-CM

## 2014-12-29 NOTE — Telephone Encounter (Signed)
Per Dr. Mayford Knife, called OTO Ambien 5 mg into [facility] [address]  Patient notified and instructed to take medication when she arrives to the sleep lab tonight.

## 2014-12-29 NOTE — Telephone Encounter (Signed)
[place]        Pt calling requesting Ambien for sleep study tonight. [facility] [address]. Please call back and advise.

## 2015-01-04 ENCOUNTER — Telehealth: Payer: Self-pay | Admitting: Cardiology

## 2015-01-04 NOTE — Telephone Encounter (Signed)
Spoke with patient to let her know that I will call her as soon as I get the results from Dr. Mayford Knife. She stated verbal understanding

## 2015-01-04 NOTE — Telephone Encounter (Signed)
New message      Pt had an urgent sleep study last Friday----she is calling to get the results

## 2015-01-11 ENCOUNTER — Telehealth: Payer: Self-pay | Admitting: Cardiology

## 2015-01-11 NOTE — Sleep Study (Signed)
   NAME: Alison Howard DATE OF BIRTH:  17-Jan-1961 MEDICAL RECORD NUMBER [id number]  LOCATION: [facility]  PHYSICIAN: Luv Mish R  DATE OF STUDY: 12/29/2014  SLEEP STUDY TYPE: Positive Airway Pressure Titration               REFERRING PHYSICIAN: Quintella Reichert, MD  INDICATION FOR STUDY: Severe OSA with AHI 27/hr on home sleep study  EPWORTH SLEEPINESS SCORE: 8 HEIGHT: 5'3" WEIGHT: 243 lbs   BMI:  43  NECK SIZE: 14 in.  MEDICATIONS: Reviewed in the chart  SLEEP ARCHITECTURE: The patient slept for a total of 292 minutes out of a total sleep period time of 363 minutes.  There was 5 minutes of slow wave sleep and 61 minutes of REM sleep.  The onset to sleep latency was prolonged at 59 minutes.  The onset to REM sleep latency was short at 61 minutes.  The sleep efficiency was reduced at 69%.    RESPIRATORY DATA: The patient was started on CPAP at 5cm H2O and this was titrated for respiratory events and snoring to 14cm H2O.  The patient was tried but unsuccessful with CPAP titration due to ongoing respiratory events and changed to BiPAP.  The patient was titrated on BiPAP starting at 16/12cm H2O but AHI 15 per hour.  Mild snoring was noted throughout the study.    OXYGEN DATA: The average oxygen saturation during the study was 93%.  The lowest oxygen saturation was 86%.    CARDIAC DATA: The patient maintained ectopic atrial rhythm with PVC's and PAC's during the study.  The average heart rate was 65 bpm.  The lowest heart rate was 30 bpm and the fastest heart rate was 154 bpm.    MOVEMENT/PARASOMNIA: There were no periodic limb movements or REM sleep behavior disorders noted during the study.   IMPRESSION/ RECOMMENDATION:   1.  Severe OSA with an AHI of 27 events per hour on home sleep study. 2.  The patient was tried but unsuccessful at CPAP titration.  There was not enough time for adequate BIPAP titration.   3.  Mild snoring was noted during the study, 4.   Reduced sleep efficiency with increased frequency or arousals due to respiratory events.   5.  Ectopic atrial rhythm with PVC's and PAC's were noted during the study. 6.  Recommend 2 week auto BiPAP titration from 4 to 20cm H2O with a ResMed BiPAP device with Bi flex of 3, heated humidifier and ResMed Airfit N10 nasal mask with chin strap.   7. Treatment would also include careful attention to proper sleep hygiene, weight reduction for elevated BMI, avoidance of sleeping in the supine position and avoidance of alcohol within four hours of bedtime.  Specific treatment decisions should be tailored to each patient based upon the clinical situation and all treatment options should be considered.  The patient should be instructed to avoid driving if sleepy and careful clinical follow up is needed to ensure that the patient's symptoms are improving with therapy and the PAP adherence is supported and measured if prescribed.    Signed: Quintella Reichert Diplomate, American Board of Sleep Medicine  ELECTRONICALLY SIGNED ON:  01/11/2015, 9:54 PM [facility] PH: [phone]   FX: [phone] ACCREDITED BY THE AMERICAN ACADEMY OF SLEEP MEDICINE

## 2015-01-11 NOTE — Addendum Note (Signed)
Addended by: Armanda Magic R on: 01/11/2015 10:09 PM   Modules accepted: Orders

## 2015-01-11 NOTE — Telephone Encounter (Signed)
Patient could not be titrated using CPAP.  Please let her know that she will have auto BiPAP titration at home over 2 weeks.  Please let [facility] know that order is in EPIC.  Set patient up for OV with me in 12 weeks

## 2015-01-12 ENCOUNTER — Encounter: Payer: Self-pay | Admitting: Cardiology

## 2015-01-12 NOTE — Telephone Encounter (Signed)
[facility] Notified.    Patient is aware.    12 week follow-up scheduled for 04/09/15

## 2015-01-17 ENCOUNTER — Telehealth: Payer: Self-pay | Admitting: Cardiology

## 2015-01-17 NOTE — Telephone Encounter (Signed)
Patient called to say that [facility] care called and she is going to have to pay for the machine out of pocket.     I contacted Mardelle Matte with [facility], and he stated that because the patient has a $5000 deductible, she will have to pay nearly $2000 for the BiPAP machine - patient stated that this was just not possible.  In my Discussion with Mardelle Matte, we talked about the Assistance Program, but they do not have BiPAP machines.    Mardelle Matte stated that if Dr. Mayford Knife would allow an order change, we could order a CPAP through the assistance program and order it to be at the max pressure, then the patient would at least get some help for her sleep apnea, which would be better than ger going without a machine.  Dr Mayford Knife - Can you provide some guidance on this issue?

## 2015-01-17 NOTE — Telephone Encounter (Signed)
Left message to call back  

## 2015-01-17 NOTE — Telephone Encounter (Signed)
Go ahead and order CPAP with 2 week autotitration from 4 to 20cm H2O

## 2015-01-17 NOTE — Telephone Encounter (Signed)
New message      Pt calling regarding sleep apnea study

## 2015-01-19 ENCOUNTER — Other Ambulatory Visit: Payer: Self-pay | Admitting: *Deleted

## 2015-01-19 DIAGNOSIS — G4733 Obstructive sleep apnea (adult) (pediatric): Secondary | ICD-10-CM

## 2015-01-19 NOTE — Telephone Encounter (Signed)
CPAP Order has been placed.    Patient is using the CPAP Assistance Program.   Form has been completed to fax, patient is making $100 donation to sleep foundation for machine.

## 2015-01-22 ENCOUNTER — Encounter: Payer: Self-pay | Admitting: Cardiology

## 2015-01-31 ENCOUNTER — Telehealth: Payer: Self-pay | Admitting: Cardiology

## 2015-01-31 NOTE — Telephone Encounter (Signed)
Patient was alerted that her CPAP Machine from the CPAP Assistance program was delivered, but she did not get it.  I let her know that it has to be delivered to [facility] in order for it to be set up, so she should be hearing from them shortly.   She stated verbal understanding.

## 2015-01-31 NOTE — Telephone Encounter (Signed)
New message      Talk to a nurse regarding her CPAP machine

## 2015-04-09 ENCOUNTER — Ambulatory Visit (INDEPENDENT_AMBULATORY_CARE_PROVIDER_SITE_OTHER): Payer: BLUE CROSS/BLUE SHIELD | Admitting: Cardiology

## 2015-04-09 ENCOUNTER — Encounter: Payer: Self-pay | Admitting: Cardiology

## 2015-04-09 VITALS — BP 100/80 | HR 61 | Ht 63.0 in | Wt 242.1 lb

## 2015-04-09 DIAGNOSIS — G4733 Obstructive sleep apnea (adult) (pediatric): Secondary | ICD-10-CM

## 2015-04-09 NOTE — Progress Notes (Signed)
Cardiology Office Note   Date:  04/09/2015   ID:  Alison Howard, DOB 1960-08-04, MRN [id number]  PCP:  Johny Blamer, MD    Chief Complaint  Patient presents with  . OSA      History of Present Illness: Alison Howard is a 54 y.o. female who presents for followup of sleep apnea. The patient  had a home sleep study done which showed an average AHI of 27 event per hour over 3 nights with the highest AHI being 40 events per hour. Her lowest oxygen saturation was 77% consistent with severe obstructive sleep apnea. She does not get any aerobic exercise except for walking the dog.   SHe underwent home CPAP titration but could not be adequately titrated on CPAP and a 2 week autotitration on BiPAP was ordered but BiPAP machine too expensive so we did a home CPAP titration for 2 weeks.  Her 95th% pressure was 7.8cm H2O with an AHI of 4.9/hr but she has only been 43% compliant in using more than 4 hours nightly.      Past Medical History  Diagnosis Date  . NICM (nonischemic cardiomyopathy)     dialted  . Hypercholesteremia   . LBBB (left bundle branch block)   . GERD (gastroesophageal reflux disease)   . Chronic systolic heart failure 04/25/2013  . Cardiomyopathy 04/25/2013  . Biventricular defibrillator- St. Jude 04/25/2013    DOI 2012   . OSA (obstructive sleep apnea) 12/28/2014    Severe with AHI 27 events per hour on average and the highest AHI was 40 events per hour  . Morbid obesity 12/28/2014    Past Surgical History  Procedure Laterality Date  . Total abdominal hysterectomy    . Pacemaker placement       Current Outpatient Prescriptions  Medication Sig Dispense Refill  . bisoprolol (ZEBETA) 10 MG tablet Take 1 tablet (10 mg total) by mouth daily. 30 tablet 6  . levothyroxine (SYNTHROID, LEVOTHROID) 25 MCG tablet Take 1 tablet (25 mcg total) by mouth daily before breakfast. 30 tablet 1  . pantoprazole (PROTONIX) 40 MG tablet Take 40 mg by mouth daily.    .  valsartan (DIOVAN) 80 MG tablet Take 1 tablet (80 mg total) by mouth daily. 30 tablet 1  . mexiletine (MEXITIL) 200 MG capsule Take 1 capsule (200 mg total) by mouth 2 (two) times daily. (Patient not taking: Reported on 04/09/2015) 90 capsule 1   No current facility-administered medications for this visit.    Allergies:   Ciprofloxacin hcl; Codeine; Morphine and related; and Sulfa antibiotics    Social History:  The patient  reports that she has never smoked. She does not have any smokeless tobacco history on file. She reports that she drinks alcohol. She reports that she does not use illicit drugs.   Family History:  The patient's family history includes Coronary artery disease in her mother; Heart attack in her mother; Heart disease in her father; Hyperlipidemia in her sister.    ROS:  Please see the history of present illness.   Otherwise, review of systems are positive for none.   All other systems are reviewed and negative.    PHYSICAL EXAM: VS:  BP 100/80 mmHg  Pulse 61  Ht 5\' 3"  (1.6 m)  Wt 242 lb 1.9 oz (109.825 kg)  BMI 42.90 kg/m2  SpO2 96% , BMI Body mass index is 42.9 kg/(m^2).  GEN: Well nourished, well developed, in no acute distress HEENT: normal Neck: no JVD, carotid bruits, or masses Cardiac: RRR; no murmurs, rubs, or gallops,no edema  Respiratory:  clear to auscultation bilaterally, normal work of breathing GI: soft, nontender, nondistended, + BS MS: no deformity or atrophy Skin: warm and dry, no rash Neuro:  Strength and sensation are intact Psych: euthymic mood, full affect   EKG:  EKG is not ordered today.    Recent Labs: 07/06/2014: Hemoglobin 14.3; Platelets 341.0; Pro B Natriuretic peptide (BNP) 37.0; TSH 3.95 11/06/2014: BUN 9; Creatinine, Ser 0.76; Potassium 3.9; Sodium 139    Lipid Panel No results found for: CHOL, TRIG, HDL, CHOLHDL, VLDL, LDLCALC, LDLDIRECT    Wt Readings from Last 3 Encounters:  04/09/15 242 lb 1.9 oz (109.825 kg)    12/28/14 243 lb 12.8 oz (110.587 kg)  09/05/14 241 lb 3.2 oz (109.408 kg)      ASSESSMENT AND PLAN:  1. Severe obstructive sleep apnea with ah AHI average over 3 nights of 27 events per hour and the highest AHI was 40 events per hour. There were oxygen desaturations as low as 77%.based on home CPAP titration, I will set her at 9cm H2O and get a 2 week d/l from her DME.  I have also instructed the patient on proper sleep hygiene, avoidance of sleeping in the supine position and avoidance of alcohol within 4 hours of bedtime. The patient was also instructed to avoid driving if sleepy.  2. Obesity - I have encouraged her to get into a routine exercise program and work on low fat diet with focus on small portions.   Current medicines are reviewed at length with the patient today.  The patient does not have concerns regarding medicines.  The following changes have been made:  no change  Labs/ tests ordered today: See above Assessment and Plan No orders of the defined types were placed in this encounter.     Disposition:   FU with me in 6 months  Signed, Quintella Reichert, MD  04/09/2015 4:13 PM    [facility] [address] [place], [place], [place]  [address] Phone: [phone]; Fax: [phone]

## 2015-04-09 NOTE — Patient Instructions (Signed)
Medication Instructions:  Your physician recommends that you continue on your current medications as directed. Please refer to the Current Medication list given to you today.   Labwork: None  Testing/Procedures: None  Follow-Up: Your physician wants you to follow-up in: 6 months with Dr. Mayford Knife. You will receive a reminder letter in the mail two months in advance. If you don't receive a letter, please call our office to schedule the follow-up appointment.   Any Other Special Instructions Will Be Listed Below (If Applicable). [facility] will be in touch with you soon to change your CPAP settings.

## 2015-04-26 ENCOUNTER — Encounter: Payer: Self-pay | Admitting: Cardiology

## 2015-05-08 ENCOUNTER — Other Ambulatory Visit: Payer: Self-pay | Admitting: Internal Medicine

## 2015-06-30 ENCOUNTER — Other Ambulatory Visit: Payer: Self-pay | Admitting: Internal Medicine

## 2015-09-26 ENCOUNTER — Encounter: Payer: Self-pay | Admitting: *Deleted

## 2015-10-10 ENCOUNTER — Encounter: Payer: Self-pay | Admitting: Cardiology

## 2015-10-10 NOTE — Progress Notes (Signed)
Cardiology Office Note   Date:  10/11/2015   ID:  Alison Howard, DOB Oct 03, 1960, MRN [id number]  PCP:  Johny Blamer, MD    Chief Complaint  Patient presents with  . Sleep Apnea    no sx      History of Present Illness: Alison Howard is a 55 y.o. female with a history of severe OSA by home sleep study with AHI being 40 events per hour. Her lowest oxygen saturation was 77% consistent with severe obstructive sleep apnea. She is now on  CPAP therapy but has not been using it.  She works out of town and forgets to take it along.  She tolerates the mask and feels the pressure is adequate.  She walks for exercise and has been dieting and lost 10 lbs.      Past Medical History  Diagnosis Date  . NICM (nonischemic cardiomyopathy) (HCC)     dialted  . Hypercholesteremia   . LBBB (left bundle branch block)   . GERD (gastroesophageal reflux disease)   . Chronic systolic heart failure (HCC) 04/25/2013  . Cardiomyopathy (HCC) 04/25/2013  . Biventricular defibrillator- St. Jude 04/25/2013    DOI 2012   . OSA (obstructive sleep apnea) 12/28/2014    Severe with AHI 27 events per hour on average and the highest AHI was 40 events per hour  . Morbid obesity (HCC) 12/28/2014    Past Surgical History  Procedure Laterality Date  . Total abdominal hysterectomy    . Pacemaker placement       Current Outpatient Prescriptions  Medication Sig Dispense Refill  . bisoprolol (ZEBETA) 10 MG tablet TAKE 1 TABLET (10 MG TOTAL) BY MOUTH DAILY. 30 tablet 6  . citalopram (CELEXA) 20 MG tablet Take 20 mg by mouth daily.    . pantoprazole (PROTONIX) 40 MG tablet Take 40 mg by mouth 2 (two) times daily.     . valsartan (DIOVAN) 80 MG tablet Take 1 tablet (80 mg total) by mouth daily. 30 tablet 1   No current facility-administered medications for this visit.    Allergies:   Ciprofloxacin hcl; Codeine; Morphine and related; and Sulfa antibiotics    Social History:  The patient  reports  that she has never smoked. She does not have any smokeless tobacco history on file. She reports that she drinks alcohol. She reports that she does not use illicit drugs.   Family History:  The patient's family history includes Coronary artery disease in her mother; Heart attack in her mother; Heart disease in her father; Hyperlipidemia in her sister.    ROS:  Please see the history of present illness.   Otherwise, review of systems are positive for none.   All other systems are reviewed and negative.    PHYSICAL EXAM: VS:  BP 111/80 mmHg  Pulse 64  Ht 5\' 3"  (1.6 m)  Wt 234 lb (106.142 kg)  BMI 41.46 kg/m2  SpO2 95% , BMI Body mass index is 41.46 kg/(m^2). GEN: Well nourished, well developed, in no acute distress HEENT: normal Neck: no JVD, carotid bruits, or masses Cardiac: RRR; no murmurs, rubs, or gallops,no edema  Respiratory:  clear to auscultation bilaterally, normal work of breathing GI: soft, nontender, nondistended, + BS MS: no deformity or atrophy Skin: warm and dry, no rash Neuro:  Strength and sensation are intact Psych: euthymic mood, full affect   EKG:  EKG is  not ordered today.    Recent Labs: 11/06/2014: BUN 9; Creatinine, Ser 0.76; Potassium 3.9; Sodium 139    Lipid Panel No results found for: CHOL, TRIG, HDL, CHOLHDL, VLDL, LDLCALC, LDLDIRECT    Wt Readings from Last 3 Encounters:  10/11/15 234 lb (106.142 kg)  04/09/15 242 lb 1.9 oz (109.825 kg)  12/28/14 243 lb 12.8 oz (110.587 kg)    ASSESSMENT AND PLAN:  1. Severe obstructive sleep apnea with AHI  40 events per hour. There were oxygen desaturations as low as 77%.based on home CPAP titration.  She is now on CPAP at 9cm H2O and tolerating well.  Continue on current settings.  I will get a d/l from DME.   2. Obesity - I have encouraged her to get into a routine exercise program and work on low fat diet with focus on small portions.    Current medicines are reviewed at length with the patient  today.  The patient does not have concerns regarding medicines.  The following changes have been made:  no change  Labs/ tests ordered today: See above Assessment and Plan No orders of the defined types were placed in this encounter.     Disposition:   FU with me in 1 year  Signed, Quintella Reichert, MD  10/11/2015 9:52 AM    [facility] [address] [place], [place], [place]  [address] Phone: [phone]; Fax: [phone]

## 2015-10-11 ENCOUNTER — Encounter: Payer: Self-pay | Admitting: Cardiology

## 2015-10-11 ENCOUNTER — Ambulatory Visit (INDEPENDENT_AMBULATORY_CARE_PROVIDER_SITE_OTHER): Payer: BLUE CROSS/BLUE SHIELD | Admitting: Cardiology

## 2015-10-11 VITALS — BP 111/80 | HR 64 | Ht 63.0 in | Wt 234.0 lb

## 2015-10-11 DIAGNOSIS — G4733 Obstructive sleep apnea (adult) (pediatric): Secondary | ICD-10-CM

## 2015-10-11 NOTE — Patient Instructions (Signed)

## 2015-10-17 ENCOUNTER — Telehealth: Payer: Self-pay | Admitting: Cardiology

## 2015-10-17 ENCOUNTER — Encounter: Payer: BLUE CROSS/BLUE SHIELD | Admitting: *Deleted

## 2015-10-17 NOTE — Telephone Encounter (Signed)
Attempted to confirm remote transmission with pt. No answer and was unable to leave a message.   

## 2015-10-19 ENCOUNTER — Encounter: Payer: Self-pay | Admitting: Cardiology

## 2015-10-19 ENCOUNTER — Ambulatory Visit (INDEPENDENT_AMBULATORY_CARE_PROVIDER_SITE_OTHER): Payer: BLUE CROSS/BLUE SHIELD | Admitting: *Deleted

## 2015-10-19 ENCOUNTER — Encounter: Payer: Self-pay | Admitting: Internal Medicine

## 2015-10-19 DIAGNOSIS — I5022 Chronic systolic (congestive) heart failure: Secondary | ICD-10-CM | POA: Diagnosis not present

## 2015-10-19 DIAGNOSIS — I429 Cardiomyopathy, unspecified: Secondary | ICD-10-CM | POA: Diagnosis not present

## 2015-10-19 DIAGNOSIS — I428 Other cardiomyopathies: Secondary | ICD-10-CM

## 2015-10-20 ENCOUNTER — Other Ambulatory Visit: Payer: Self-pay | Admitting: Internal Medicine

## 2015-10-22 NOTE — Telephone Encounter (Signed)
Pt called into the office. I explained to her that her transmission was not received on Wednesday 10-17-15 but it was received on Friday 10-19-15. I informed her that her 10-17-15 remote appt was rescheduled today (Monday 10-22-15) for Friday 10-19-15. I informed her that someone attempted to call her on 10-17-15 to remind her of the scheduled remote appt. Pt stated that that she did not receive a call. We verified that her correct number was in her chart. Informed pt that she will get a letter letting her know when she is scheduled for her next remote transmission. Pt verbalized understanding.

## 2015-10-22 NOTE — Progress Notes (Signed)
Remote ICD transmission.   

## 2015-11-08 ENCOUNTER — Other Ambulatory Visit: Payer: Self-pay | Admitting: Internal Medicine

## 2015-11-16 LAB — CUP PACEART REMOTE DEVICE CHECK
Brady Statistic RA Percent Paced: 5.7 %
Brady Statistic RV Percent Paced: 90 %
Date Time Interrogation Session: 20170607154301
HighPow Impedance: 50 Ohm
Implantable Lead Implant Date: 20120529
Implantable Lead Implant Date: 20120529
Implantable Lead Implant Date: 20120529
Implantable Lead Location: 753858
Implantable Lead Location: 753859
Implantable Lead Location: 753860
Lead Channel Impedance Value: 1000 Ohm
Lead Channel Impedance Value: 450 Ohm
Lead Channel Impedance Value: 450 Ohm
Lead Channel Pacing Threshold Amplitude: 0.75 V
Lead Channel Pacing Threshold Amplitude: 1 V
Lead Channel Pacing Threshold Amplitude: 1.375 V
Lead Channel Pacing Threshold Pulse Width: 0.5 ms
Lead Channel Pacing Threshold Pulse Width: 0.5 ms
Lead Channel Pacing Threshold Pulse Width: 0.5 ms
Lead Channel Sensing Intrinsic Amplitude: 12 mV
Lead Channel Sensing Intrinsic Amplitude: 3.9 mV
Lead Channel Setting Pacing Amplitude: 1.75 V
Lead Channel Setting Pacing Amplitude: 2 V
Lead Channel Setting Pacing Amplitude: 2.5 V
Lead Channel Setting Pacing Pulse Width: 0.5 ms
Lead Channel Setting Pacing Pulse Width: 0.5 ms
Lead Channel Setting Sensing Sensitivity: 0.5 mV
Pulse Gen Serial Number: 642812

## 2015-12-13 ENCOUNTER — Encounter: Payer: Self-pay | Admitting: Internal Medicine

## 2015-12-13 ENCOUNTER — Ambulatory Visit (INDEPENDENT_AMBULATORY_CARE_PROVIDER_SITE_OTHER): Payer: BLUE CROSS/BLUE SHIELD | Admitting: Internal Medicine

## 2015-12-13 VITALS — BP 110/60 | HR 67 | Ht 62.5 in | Wt 234.8 lb

## 2015-12-13 DIAGNOSIS — I493 Ventricular premature depolarization: Secondary | ICD-10-CM

## 2015-12-13 DIAGNOSIS — I429 Cardiomyopathy, unspecified: Secondary | ICD-10-CM

## 2015-12-13 DIAGNOSIS — I5022 Chronic systolic (congestive) heart failure: Secondary | ICD-10-CM

## 2015-12-13 DIAGNOSIS — I428 Other cardiomyopathies: Secondary | ICD-10-CM

## 2015-12-13 LAB — CBC WITH DIFFERENTIAL/PLATELET
Basophils Absolute: 0 cells/uL (ref 0–200)
Basophils Relative: 0 %
Eosinophils Absolute: 336 cells/uL (ref 15–500)
Eosinophils Relative: 3 %
HCT: 43.4 % (ref 35.0–45.0)
Hemoglobin: 14.8 g/dL (ref 11.7–15.5)
Lymphocytes Relative: 36 %
Lymphs Abs: 4032 cells/uL — ABNORMAL HIGH (ref 850–3900)
MCH: 30.8 pg (ref 27.0–33.0)
MCHC: 34.1 g/dL (ref 32.0–36.0)
MCV: 90.4 fL (ref 80.0–100.0)
MPV: 11.1 fL (ref 7.5–12.5)
Monocytes Absolute: 896 cells/uL (ref 200–950)
Monocytes Relative: 8 %
Neutro Abs: 5936 cells/uL (ref 1500–7800)
Neutrophils Relative %: 53 %
Platelets: 375 10*3/uL (ref 140–400)
RBC: 4.8 MIL/uL (ref 3.80–5.10)
RDW: 13 % (ref 11.0–15.0)
WBC: 11.2 10*3/uL — ABNORMAL HIGH (ref 3.8–10.8)

## 2015-12-13 LAB — BASIC METABOLIC PANEL
BUN: 13 mg/dL (ref 7–25)
CO2: 27 mmol/L (ref 20–31)
Calcium: 9.5 mg/dL (ref 8.6–10.4)
Chloride: 100 mmol/L (ref 98–110)
Creat: 0.8 mg/dL (ref 0.50–1.05)
Glucose, Bld: 97 mg/dL (ref 65–99)
Potassium: 4.1 mmol/L (ref 3.5–5.3)
Sodium: 139 mmol/L (ref 135–146)

## 2015-12-13 LAB — MAGNESIUM: Magnesium: 2.1 mg/dL (ref 1.5–2.5)

## 2015-12-13 LAB — TSH: TSH: 3.19 mIU/L

## 2015-12-13 MED ORDER — BISOPROLOL FUMARATE 10 MG PO TABS: ORAL_TABLET | ORAL | Status: DC

## 2015-12-13 MED ORDER — VALSARTAN 80 MG PO TABS: 80.0000 mg | ORAL_TABLET | Freq: Every day | ORAL | Status: DC

## 2015-12-13 NOTE — Progress Notes (Signed)
Patient Care Team: Johny Blamer, MD as PCP - General (Family Medicine)   HPI  Alison Howard is a 55 y.o. female Seen in followup for CRT-D implanted at [facility] for cardiomyopathy presumed nonischemic of >5 yrs duration   She was seen in 6/15 following appropriate therapies for VT-ATP   There were no associated symptoms  She is noted increasing shortness of breath and exercise intolerance. She denies peripheral edema.  She has sleep apnea but only on her back so she does not use CPAP  she has noted increasing fatigue. She awakens herself on occasion.  Recently started on Aldactone and post initiation potassium on 11/14 was 3.9;  BE is stopped the medication because of associated fatigue; she is better off of the medication   Her echo in 8/13 demonstrated ejection fraction of 40-45%. Review of serial echoes 12/14 was 40-45% and most recently was 25-30%  With increasing PVCs 2/16 suggested AAD trial to suppress the PVC and assess effect opon EF  Not seen by me since then; started on MEX* did not toerate 2/2 itiching  No symptoms apart from baseline chreonic sob and no edema  We tried her on different betablockers and metop and bystolic were assoc with less sleep disturbance  Past Medical History  Diagnosis Date  . NICM (nonischemic cardiomyopathy) (HCC)     dialted  . Hypercholesteremia   . LBBB (left bundle branch block)   . GERD (gastroesophageal reflux disease)   . Chronic systolic heart failure (HCC) 04/25/2013  . Cardiomyopathy (HCC) 04/25/2013  . Biventricular defibrillator- St. Jude 04/25/2013    DOI 2012   . OSA (obstructive sleep apnea) 12/28/2014    Severe with AHI 27 events per hour on average and the highest AHI was 40 events per hour  . Morbid obesity (HCC) 12/28/2014    Past Surgical History  Procedure Laterality Date  . Total abdominal hysterectomy    . Pacemaker placement      Current Outpatient Prescriptions  Medication Sig Dispense Refill  .  bisoprolol (ZEBETA) 10 MG tablet TAKE 1 TABLET (10 MG TOTAL) BY MOUTH DAILY. 90 tablet 3  . citalopram (CELEXA) 20 MG tablet Take 20 mg by mouth daily.    . pantoprazole (PROTONIX) 40 MG tablet Take 40 mg by mouth 2 (two) times daily.     . valsartan (DIOVAN) 80 MG tablet Take 1 tablet (80 mg total) by mouth daily. 90 tablet 3   No current facility-administered medications for this visit.    Allergies  Allergen Reactions  . Ciprofloxacin Hcl Anaphylaxis  . Codeine Anaphylaxis and Nausea And Vomiting  . Morphine And Related Anaphylaxis  . Sulfa Antibiotics Anaphylaxis    Review of Systems negative except from HPI and PMH  Physical Exam BP 110/60 mmHg  Pulse 67  Ht 5' 2.5" (1.588 m)  Wt 234 lb 12.8 oz (106.505 kg)  BMI 42.23 kg/m2 Well developed and well nourished in no acute distress HENT normal E scleral and icterus clear Neck Supple JVP 7-8 cm ablation carotids brisk and full Clear to ausculation  Regular rate and rhythm, no murmurs gallops or rub Soft with active bowel sounds No clubbing cyanosis none Edema Alert and oriented, grossly normal motor and sensory function Skin Warm and Dry  ECG demonstrates AV biventricular pacing with 4/13 PVCs occurring with a left bundle inferior axix  Assessment and  Plan  Nonischemic cardiomyopathy  Congestive heart failure-chronic-systolic  PVCs  Sleep disordered breathing  Morbidly obese  Treated hypothyroidism We will recheick LV function 10% PVC   Do we need to treat will be determined by LV function  Discussed meds and ablatikon     We spent more than 50% of our >25 min visit in face to face counseling regarding the above

## 2015-12-13 NOTE — Patient Instructions (Signed)
Medication Instructions: - Your physician recommends that you continue on your current medications as directed. Please refer to the Current Medication list given to you today.  Labwork: - Your physician recommends that you have lab work today: BMP/ Magnesium/ CBC/ TSH  Procedures/Testing: - Your physician has requested that you have an echocardiogram. Echocardiography is a painless test that uses sound waves to create images of your heart. It provides your doctor with information about the size and shape of your heart and how well your heart's chambers and valves are working. This procedure takes approximately one hour. There are no restrictions for this procedure.  Follow-Up: - Remote monitoring is used to monitor your Pacemaker of ICD from home. This monitoring reduces the number of office visits required to check your device to one time per year. It allows [place] to keep an eye on the functioning of your device to ensure it is working properly. You are scheduled for a device check from home on 03/13/16. You may send your transmission at any time that day. If you have a wireless device, the transmission will be sent automatically. After your physician reviews your transmission, you will receive a postcard with your next transmission date.  - Your physician wants you to follow-up in: 1 year with Dr. Graciela Husbands. You will receive a reminder letter in the mail two months in advance. If you don't receive a letter, please call our office to schedule the follow-up appointment.  Any Additional Special Instructions Will Be Listed Below (If Applicable).    If you need a refill on your cardiac medications before your next appointment, please call your pharmacy.

## 2015-12-17 ENCOUNTER — Telehealth: Payer: Self-pay | Admitting: *Deleted

## 2015-12-17 LAB — CUP PACEART INCLINIC DEVICE CHECK
Date Time Interrogation Session: 20170607154250
Implantable Lead Implant Date: 20120529
Implantable Lead Implant Date: 20120529
Implantable Lead Implant Date: 20120529
Implantable Lead Location: 753858
Implantable Lead Location: 753859
Implantable Lead Location: 753860
Pulse Gen Serial Number: 642812

## 2015-12-17 NOTE — Telephone Encounter (Signed)
-----   Message from Nigel Sloop, RN sent at 12/16/2015  9:14 PM EDT -----   ----- Message -----    From: Jefferey Pica, RN    Sent: 12/13/2015   4:13 PM      To: [facility]  Hey Girls!  This patient saw Dr. Graciela Husbands today...she was upset because she never heard back from her last transmission that was sent on 10/19/15- she states she usually gets a message through MyChart, but nothing. 2nd she was concerned that her interrogation today would not be covered as she just had a transmission done on 10/19/15. I wasn't sure how to answer that question. Can someone be so kind as to please call and explain this to her. Thanks! (contact # in chart is correct)

## 2015-12-17 NOTE — Telephone Encounter (Signed)
Called patient to advise that her remote transmission was received and that she did not receive a letter due to an error on our part.  Advised patient to call the device clinic in the future if she has questions, concerns, or wants results from her transmission.  Patient also wanted to ensure that her remote transmission and office visit with Dr. Graciela Husbands would be covered by insurance.  Advised patient that they would be even though the appointments were not exactly 91 days apart as they were different appointment types.  Gave billing office phone number if she has any additional questions.  Patient is appreciative of assistance and denies additional questions at this time.

## 2016-01-04 ENCOUNTER — Other Ambulatory Visit: Payer: Self-pay

## 2016-01-04 ENCOUNTER — Ambulatory Visit (HOSPITAL_COMMUNITY): Payer: BLUE CROSS/BLUE SHIELD | Attending: Cardiology

## 2016-01-04 DIAGNOSIS — E785 Hyperlipidemia, unspecified: Secondary | ICD-10-CM | POA: Diagnosis not present

## 2016-01-04 DIAGNOSIS — I517 Cardiomegaly: Secondary | ICD-10-CM | POA: Diagnosis not present

## 2016-01-04 DIAGNOSIS — R29898 Other symptoms and signs involving the musculoskeletal system: Secondary | ICD-10-CM | POA: Diagnosis not present

## 2016-01-04 DIAGNOSIS — I447 Left bundle-branch block, unspecified: Secondary | ICD-10-CM | POA: Insufficient documentation

## 2016-01-04 DIAGNOSIS — Z6841 Body Mass Index (BMI) 40.0 and over, adult: Secondary | ICD-10-CM | POA: Diagnosis not present

## 2016-01-04 DIAGNOSIS — I429 Cardiomyopathy, unspecified: Secondary | ICD-10-CM | POA: Diagnosis not present

## 2016-01-04 DIAGNOSIS — I509 Heart failure, unspecified: Secondary | ICD-10-CM | POA: Insufficient documentation

## 2016-01-04 DIAGNOSIS — I493 Ventricular premature depolarization: Secondary | ICD-10-CM | POA: Insufficient documentation

## 2016-01-04 DIAGNOSIS — G4733 Obstructive sleep apnea (adult) (pediatric): Secondary | ICD-10-CM | POA: Diagnosis not present

## 2016-01-04 DIAGNOSIS — Z8249 Family history of ischemic heart disease and other diseases of the circulatory system: Secondary | ICD-10-CM | POA: Diagnosis not present

## 2016-01-10 ENCOUNTER — Telehealth: Payer: Self-pay | Admitting: Internal Medicine

## 2016-01-10 DIAGNOSIS — I493 Ventricular premature depolarization: Secondary | ICD-10-CM

## 2016-01-10 MED ORDER — MEXILETINE HCL 200 MG PO CAPS: ORAL_CAPSULE | ORAL | Status: DC

## 2016-01-10 NOTE — Telephone Encounter (Signed)
Notes Recorded by Duke Salvia, MD on 01/10/2016 at 11:11 AM Please Inform Patient Echo showed  stable heart muscle function at about 35 %  I would suggest we try and reduce the number of PVC with meds and see if it improves heart muscle function  Mexilitene 200 bid  holter in 6 weeks   The patient is aware of her echo report and agreeable to starting mexilitine per Dr. Odessa Fleming recommendations. She is also agreeable to a repeat 24 hour holter in ~ 6 weeks. She is aware I will order her monitor and scheduling will be in touch to arrange this for her. RX called to pharmacy- [facility].

## 2016-03-03 ENCOUNTER — Ambulatory Visit (INDEPENDENT_AMBULATORY_CARE_PROVIDER_SITE_OTHER): Payer: BLUE CROSS/BLUE SHIELD

## 2016-03-03 DIAGNOSIS — I493 Ventricular premature depolarization: Secondary | ICD-10-CM | POA: Diagnosis not present

## 2016-03-12 ENCOUNTER — Other Ambulatory Visit: Payer: Self-pay | Admitting: Internal Medicine

## 2016-03-13 ENCOUNTER — Ambulatory Visit (INDEPENDENT_AMBULATORY_CARE_PROVIDER_SITE_OTHER): Payer: BLUE CROSS/BLUE SHIELD | Admitting: *Deleted

## 2016-03-13 DIAGNOSIS — I428 Other cardiomyopathies: Secondary | ICD-10-CM

## 2016-03-13 DIAGNOSIS — I429 Cardiomyopathy, unspecified: Secondary | ICD-10-CM

## 2016-03-13 DIAGNOSIS — I5022 Chronic systolic (congestive) heart failure: Secondary | ICD-10-CM

## 2016-03-13 NOTE — Progress Notes (Signed)
Remote ICD transmission.   

## 2016-03-14 ENCOUNTER — Encounter: Payer: Self-pay | Admitting: Cardiology

## 2016-03-14 LAB — CUP PACEART REMOTE DEVICE CHECK
Battery Remaining Longevity: 25 mo
Battery Remaining Percentage: 28 %
Battery Voltage: 2.84 V
Brady Statistic AP VP Percent: 7.7 %
Brady Statistic AP VS Percent: 1 %
Brady Statistic AS VP Percent: 84 %
Brady Statistic AS VS Percent: 1.9 %
Brady Statistic RA Percent Paced: 2.8 %
Date Time Interrogation Session: 20170817061949
HighPow Impedance: 47 Ohm
Implantable Lead Implant Date: 20120529
Implantable Lead Implant Date: 20120529
Implantable Lead Implant Date: 20120529
Implantable Lead Location: 753858
Implantable Lead Location: 753859
Implantable Lead Location: 753860
Lead Channel Impedance Value: 400 Ohm
Lead Channel Impedance Value: 430 Ohm
Lead Channel Impedance Value: 930 Ohm
Lead Channel Pacing Threshold Amplitude: 0.875 V
Lead Channel Pacing Threshold Amplitude: 1.5 V
Lead Channel Pacing Threshold Amplitude: 1.625 V
Lead Channel Pacing Threshold Pulse Width: 0.5 ms
Lead Channel Pacing Threshold Pulse Width: 0.5 ms
Lead Channel Pacing Threshold Pulse Width: 0.5 ms
Lead Channel Sensing Intrinsic Amplitude: 12 mV
Lead Channel Sensing Intrinsic Amplitude: 3.1 mV
Lead Channel Setting Pacing Amplitude: 2 V
Lead Channel Setting Pacing Amplitude: 2.5 V
Lead Channel Setting Pacing Amplitude: 2.625
Lead Channel Setting Pacing Pulse Width: 0.5 ms
Lead Channel Setting Pacing Pulse Width: 0.5 ms
Lead Channel Setting Sensing Sensitivity: 0.5 mV
Pulse Gen Serial Number: 642812

## 2016-03-26 DIAGNOSIS — E038 Other specified hypothyroidism: Secondary | ICD-10-CM | POA: Diagnosis not present

## 2016-03-26 DIAGNOSIS — F325 Major depressive disorder, single episode, in full remission: Secondary | ICD-10-CM | POA: Diagnosis not present

## 2016-03-26 DIAGNOSIS — K219 Gastro-esophageal reflux disease without esophagitis: Secondary | ICD-10-CM | POA: Diagnosis not present

## 2016-04-08 ENCOUNTER — Other Ambulatory Visit: Payer: Self-pay | Admitting: Internal Medicine

## 2016-04-09 DIAGNOSIS — Z23 Encounter for immunization: Secondary | ICD-10-CM | POA: Diagnosis not present

## 2016-04-11 DIAGNOSIS — Z1231 Encounter for screening mammogram for malignant neoplasm of breast: Secondary | ICD-10-CM | POA: Diagnosis not present

## 2016-05-01 ENCOUNTER — Telehealth: Payer: Self-pay

## 2016-05-01 NOTE — Telephone Encounter (Signed)
Referred to ICM clinic by Acie Fredrickson, device RN/Dr Graciela Husbands.  Call to patient and provided ICM intro.  She agreed to [facility] monthly calls and provided ICM direct number.  Reviewed fluid symptoms and encouraged to call.  Currently not prescribed diuretic.  1st ICM remote transmission scheduled for 05/20/2016.  Reviewed low salt diet.   She reported she reviews food labels for salt intake.  She provided verbal permission to leave detailed message on phone.

## 2016-05-20 ENCOUNTER — Telehealth: Payer: Self-pay | Admitting: Cardiology

## 2016-05-20 ENCOUNTER — Ambulatory Visit (INDEPENDENT_AMBULATORY_CARE_PROVIDER_SITE_OTHER): Payer: BLUE CROSS/BLUE SHIELD

## 2016-05-20 DIAGNOSIS — I5022 Chronic systolic (congestive) heart failure: Secondary | ICD-10-CM | POA: Diagnosis not present

## 2016-05-20 DIAGNOSIS — Z95810 Presence of automatic (implantable) cardiac defibrillator: Secondary | ICD-10-CM | POA: Diagnosis not present

## 2016-05-20 NOTE — Progress Notes (Signed)
EPIC Encounter for ICM Monitoring  Patient Name: Alison Howard is a 55 y.o. female Date: 05/20/2016 Primary Care Physican: Johny Blamer, MD Primary Cardiologist: Graciela Husbands Electrophysiologist: Graciela Husbands Dry Weight:  230 lbs Bi-V Pacing:  92%       Heart Failure questions reviewed, pt asymptomatic   Thoracic impedance normal   Recommendations: No changes.  Advised to limit salt intake to 2000 mg daily.  Encouraged to call for fluid symptoms.    Follow-up plan: ICM clinic phone appointment on 06/25/2016.  Copy of ICM check sent to device physician.   ICM trend: 05/20/2016       Karie Soda, RN 05/20/2016 12:29 PM

## 2016-05-20 NOTE — Telephone Encounter (Signed)
Spoke with pt and reminded pt of remote transmission that is due today. Pt verbalized understanding.   

## 2016-06-25 ENCOUNTER — Ambulatory Visit (INDEPENDENT_AMBULATORY_CARE_PROVIDER_SITE_OTHER): Payer: BLUE CROSS/BLUE SHIELD | Admitting: *Deleted

## 2016-06-25 DIAGNOSIS — I428 Other cardiomyopathies: Secondary | ICD-10-CM

## 2016-06-25 DIAGNOSIS — I5022 Chronic systolic (congestive) heart failure: Secondary | ICD-10-CM

## 2016-06-25 DIAGNOSIS — Z95810 Presence of automatic (implantable) cardiac defibrillator: Secondary | ICD-10-CM

## 2016-06-26 NOTE — Progress Notes (Signed)
EPIC Encounter for ICM Monitoring  Patient Name: TALEESHA ARENDALL is a 55 y.o. female Date: 06/26/2016 Primary Care Physican: Johny Blamer, MD Primary Cardiologist: Graciela Husbands Electrophysiologist: Graciela Husbands Dry Weight:     230 lbs Bi-V Pacing:  92%               Heart Failure questions reviewed, pt asymptomatic   Thoracic impedance normal   Recommendations: No changes.  Reinforced low salt food choices and limiting fluid intake to < 2 liters per day. Encouraged to call for fluid symptoms.    Follow-up plan: ICM clinic phone appointment on 07/29/2016.  Copy of ICM check sent to device physician.   ICM trend: 06/26/2016       Karie Soda, RN 06/26/2016 4:00 PM

## 2016-06-26 NOTE — Progress Notes (Signed)
Remote ICD transmission.   

## 2016-07-08 ENCOUNTER — Encounter: Payer: Self-pay | Admitting: Cardiology

## 2016-07-29 ENCOUNTER — Ambulatory Visit (INDEPENDENT_AMBULATORY_CARE_PROVIDER_SITE_OTHER): Payer: BLUE CROSS/BLUE SHIELD

## 2016-07-29 DIAGNOSIS — Z95810 Presence of automatic (implantable) cardiac defibrillator: Secondary | ICD-10-CM

## 2016-07-29 DIAGNOSIS — I5022 Chronic systolic (congestive) heart failure: Secondary | ICD-10-CM

## 2016-07-29 NOTE — Progress Notes (Signed)
EPIC Encounter for ICM Monitoring  Patient Name: Alison Howard is a 56 y.o. female Date: 07/29/2016 Primary Care Physican: Johny Blamer, MD Primary Cardiologist:Klein Electrophysiologist: Candie Echevaria Weight:unknown Bi-V Pacing: 92%       Attempted ICM call and unable to reach.  Left detailed message regarding transmission.  Transmission reviewed.   Thoracic impedance abnormal suggesting fluid accumulation from 07/08/2016 to 07/16/2016 and started again 07/28/2016.  Recommendations: Provided direct ICM number and encouraged to call for fluid symptoms.    Follow-up plan: ICM clinic phone appointment on 08/29/2016.  Copy of ICM check sent to device physician.   3 month ICM trend : 07/29/2016   1 Year ICM trend:      Karie Soda, RN 07/29/2016 7:58 AM

## 2016-08-07 LAB — CUP PACEART REMOTE DEVICE CHECK
Battery Remaining Longevity: 17 mo
Battery Remaining Percentage: 25 %
Battery Voltage: 2.81 V
Brady Statistic AP VP Percent: 5.9 %
Brady Statistic AP VS Percent: 1 %
Brady Statistic AS VP Percent: 86 %
Brady Statistic AS VS Percent: 2.3 %
Brady Statistic RA Percent Paced: 1.7 %
Date Time Interrogation Session: 20171129090013
HighPow Impedance: 52 Ohm
Implantable Lead Implant Date: 20120529
Implantable Lead Implant Date: 20120529
Implantable Lead Implant Date: 20120529
Implantable Lead Location: 753858
Implantable Lead Location: 753859
Implantable Lead Location: 753860
Implantable Pulse Generator Implant Date: 20120529
Lead Channel Impedance Value: 450 Ohm
Lead Channel Impedance Value: 460 Ohm
Lead Channel Impedance Value: 960 Ohm
Lead Channel Pacing Threshold Amplitude: 0.75 V
Lead Channel Pacing Threshold Amplitude: 1.5 V
Lead Channel Pacing Threshold Amplitude: 2 V
Lead Channel Pacing Threshold Pulse Width: 0.5 ms
Lead Channel Pacing Threshold Pulse Width: 0.5 ms
Lead Channel Pacing Threshold Pulse Width: 0.5 ms
Lead Channel Sensing Intrinsic Amplitude: 12 mV
Lead Channel Sensing Intrinsic Amplitude: 3.8 mV
Lead Channel Setting Pacing Amplitude: 2 V
Lead Channel Setting Pacing Amplitude: 2.5 V
Lead Channel Setting Pacing Amplitude: 3.5 V
Lead Channel Setting Pacing Pulse Width: 0.5 ms
Lead Channel Setting Pacing Pulse Width: 0.5 ms
Lead Channel Setting Sensing Sensitivity: 0.5 mV
Pulse Gen Serial Number: 642812

## 2016-08-19 ENCOUNTER — Other Ambulatory Visit: Payer: Self-pay | Admitting: Internal Medicine

## 2016-08-21 ENCOUNTER — Telehealth: Payer: Self-pay

## 2016-08-21 DIAGNOSIS — I428 Other cardiomyopathies: Secondary | ICD-10-CM

## 2016-08-21 DIAGNOSIS — I493 Ventricular premature depolarization: Secondary | ICD-10-CM

## 2016-08-21 MED ORDER — MEXILETINE HCL 200 MG PO CAPS
200.0000 mg | ORAL_CAPSULE | Freq: Two times a day (BID) | ORAL | 1 refills | Status: DC
Start: 2016-08-21 — End: 2016-11-16

## 2016-08-21 NOTE — Telephone Encounter (Signed)
Spoke with patient and advised would send refill for Mexiletine but unable to determine why the prescription was not filled.  She requested 90 day supply.  Advised she will have 1 refill and will be due for office visit with Dr Graciela Husbands in May.  Pharmacy preferred is [facility].

## 2016-08-21 NOTE — Telephone Encounter (Signed)
Received call from patient.  She stated the pharmacy is declining to fill medication due to name change.  She has married name of Alison Howard.  She requested refill for Mexiletine.  Advise would discuss with Dr Odessa Fleming nurse, Margaretmary Dys.

## 2016-08-21 NOTE — Telephone Encounter (Signed)
Reviewed medications with Dr Koren Bound nurse and can refill Mexiletine as requested for 6 months. Patient will be due for office visit with Dr Graciela Husbands in May.

## 2016-08-29 ENCOUNTER — Ambulatory Visit (INDEPENDENT_AMBULATORY_CARE_PROVIDER_SITE_OTHER): Payer: BLUE CROSS/BLUE SHIELD

## 2016-08-29 DIAGNOSIS — I5022 Chronic systolic (congestive) heart failure: Secondary | ICD-10-CM

## 2016-08-29 DIAGNOSIS — Z95810 Presence of automatic (implantable) cardiac defibrillator: Secondary | ICD-10-CM | POA: Diagnosis not present

## 2016-08-29 NOTE — Progress Notes (Signed)
EPIC Encounter for ICM Monitoring  Patient Name: Alison Howard is a 56 y.o. female Date: 08/29/2016 Primary Care Physican: Johny Blamer, MD Primary Cardiologist:Klein Electrophysiologist: Candie Echevaria Weight:unknown Bi-V Pacing:  92%       Attempted call to patient and unable to reach.  Left detailed message regarding transmission.  Transmission reviewed.   Thoracic impedance abnormal suggesting fluid accumulation since 08/22/2016 and almost at baseline today.  Recommendations: Left voice mail with ICM number and encouraged to call for fluid symptoms.  Follow-up plan: ICM clinic phone appointment on 09/29/2016.  Copy of ICM check sent to device physician.   3 month ICM trend: 08/29/2016   1 Year ICM trend:      Karie Soda, RN 08/29/2016 8:46 AM

## 2016-09-29 ENCOUNTER — Ambulatory Visit (INDEPENDENT_AMBULATORY_CARE_PROVIDER_SITE_OTHER): Payer: BLUE CROSS/BLUE SHIELD | Admitting: *Deleted

## 2016-09-29 DIAGNOSIS — I428 Other cardiomyopathies: Secondary | ICD-10-CM

## 2016-09-29 DIAGNOSIS — Z95810 Presence of automatic (implantable) cardiac defibrillator: Secondary | ICD-10-CM | POA: Diagnosis not present

## 2016-09-29 DIAGNOSIS — I5022 Chronic systolic (congestive) heart failure: Secondary | ICD-10-CM | POA: Diagnosis not present

## 2016-09-29 NOTE — Progress Notes (Signed)
Remote ICD transmission.   

## 2016-09-29 NOTE — Progress Notes (Signed)
EPIC Encounter for ICM Monitoring  Patient Name: Alison Howard is a 56 y.o. female Date: 09/29/2016 Primary Care Physican: Johny Blamer, MD Primary Cardiologist:Klein Electrophysiologist: Candie Echevaria Weight:unknown Bi-V Pacing:  92%               Heart Failure questions reviewed, pt asymptomatic   Thoracic impedance at normal today but was abnormal suggesting fluid accumulation from 09/25/2016 to 09/28/2016.  No diuretic  Recommendations: No changes. Reminded to limit dietary salt intake to 2000 mg/day and fluid intake to < 2 liters/day. Encouraged to call for fluid symptoms.  Follow-up plan: ICM clinic phone appointment on 10/30/2016 and is scheduling routine follow up office appointment with Dr Graciela Husbands.  Copy of ICM check sent to device physician.   3 month ICM trend: 09/29/2016   1 Year ICM trend:      Karie Soda, RN 09/29/2016 11:14 AM

## 2016-09-30 ENCOUNTER — Encounter: Payer: Self-pay | Admitting: Cardiology

## 2016-09-30 LAB — CUP PACEART REMOTE DEVICE CHECK
Battery Remaining Longevity: 19 mo
Battery Remaining Percentage: 22 %
Battery Voltage: 2.78 V
Brady Statistic AP VP Percent: 5 %
Brady Statistic AP VS Percent: 1 %
Brady Statistic AS VP Percent: 87 %
Brady Statistic AS VS Percent: 2.7 %
Brady Statistic RA Percent Paced: 1.3 %
Date Time Interrogation Session: 20180305092956
HighPow Impedance: 47 Ohm
Implantable Lead Implant Date: 20120529
Implantable Lead Implant Date: 20120529
Implantable Lead Implant Date: 20120529
Implantable Lead Location: 753858
Implantable Lead Location: 753859
Implantable Lead Location: 753860
Implantable Pulse Generator Implant Date: 20120529
Lead Channel Impedance Value: 440 Ohm
Lead Channel Impedance Value: 450 Ohm
Lead Channel Impedance Value: 950 Ohm
Lead Channel Pacing Threshold Amplitude: 0.875 V
Lead Channel Pacing Threshold Amplitude: 1.75 V
Lead Channel Pacing Threshold Amplitude: 1.875 V
Lead Channel Pacing Threshold Pulse Width: 0.5 ms
Lead Channel Pacing Threshold Pulse Width: 0.5 ms
Lead Channel Pacing Threshold Pulse Width: 0.5 ms
Lead Channel Sensing Intrinsic Amplitude: 12 mV
Lead Channel Sensing Intrinsic Amplitude: 3.5 mV
Lead Channel Setting Pacing Amplitude: 2 V
Lead Channel Setting Pacing Amplitude: 2.75 V
Lead Channel Setting Pacing Amplitude: 3.375
Lead Channel Setting Pacing Pulse Width: 0.5 ms
Lead Channel Setting Pacing Pulse Width: 0.5 ms
Lead Channel Setting Sensing Sensitivity: 0.5 mV
Pulse Gen Serial Number: 642812

## 2016-10-30 ENCOUNTER — Ambulatory Visit (INDEPENDENT_AMBULATORY_CARE_PROVIDER_SITE_OTHER): Payer: BLUE CROSS/BLUE SHIELD

## 2016-10-30 DIAGNOSIS — Z95810 Presence of automatic (implantable) cardiac defibrillator: Secondary | ICD-10-CM | POA: Diagnosis not present

## 2016-10-30 DIAGNOSIS — I5022 Chronic systolic (congestive) heart failure: Secondary | ICD-10-CM | POA: Diagnosis not present

## 2016-10-30 NOTE — Progress Notes (Signed)
EPIC Encounter for ICM Monitoring  Patient Name: Alison Howard is a 57 y.o. female Date: 10/30/2016 Primary Care Physican: Johny Blamer, MD Primary Cardiologist:Klein Electrophysiologist: Candie Echevaria Weight:does not weigh Bi-V Pacing:  92%             Heart Failure questions reviewed, pt asymptomatic.   Thoracic impedance normal but was abnormal suggesting fluid accumulation from 10/11/2016 to 10/18/2016 and 10/27/2016 to 10/29/2016.  No diuretic  Recommendations: No changes. Reminded to limit dietary salt intake to 2000 mg/day and fluid intake to < 2 liters/day. Encouraged to call for fluid symptoms.  Follow-up plan: ICM clinic phone appointment on 01/16/2017.  Defib check office appointment on 12/16/2016 with Dr Graciela Husbands.  Copy of ICM check sent to device physician.   3 month ICM trend: 10/30/2016   1 Year ICM trend:      Karie Soda, RN 10/30/2016 11:04 AM

## 2016-11-16 ENCOUNTER — Other Ambulatory Visit: Payer: Self-pay | Admitting: Internal Medicine

## 2016-11-16 DIAGNOSIS — I493 Ventricular premature depolarization: Secondary | ICD-10-CM

## 2016-11-16 DIAGNOSIS — I428 Other cardiomyopathies: Secondary | ICD-10-CM

## 2016-11-21 ENCOUNTER — Other Ambulatory Visit: Payer: Self-pay | Admitting: Internal Medicine

## 2016-11-21 DIAGNOSIS — I428 Other cardiomyopathies: Secondary | ICD-10-CM

## 2016-11-21 DIAGNOSIS — I493 Ventricular premature depolarization: Secondary | ICD-10-CM

## 2016-11-21 NOTE — Telephone Encounter (Signed)
Medication Detail    Disp Refills Start End   mexiletine (MEXITIL) 200 MG capsule 180 capsule 0 11/18/2016    Sig: TAKE ONE CAPSULE BY MOUTH TWICE A DAY   Notes to Pharmacy: Please keep upcoming appointment for future refills. Thank you   E-Prescribing Status: Receipt confirmed by pharmacy (11/18/2016 8:03 AM EDT)   Associated Diagnoses   NICM (nonischemic cardiomyopathy) (HCC)     PVC's (premature ventricular contractions)     Pharmacy   [facility] - [place], [place] - [address]

## 2016-12-01 ENCOUNTER — Encounter: Payer: Self-pay | Admitting: Internal Medicine

## 2016-12-16 ENCOUNTER — Telehealth: Payer: Self-pay | Admitting: Internal Medicine

## 2016-12-16 ENCOUNTER — Ambulatory Visit (INDEPENDENT_AMBULATORY_CARE_PROVIDER_SITE_OTHER): Payer: BLUE CROSS/BLUE SHIELD | Admitting: Internal Medicine

## 2016-12-16 VITALS — BP 112/90 | HR 71 | Ht 62.0 in | Wt 235.0 lb

## 2016-12-16 DIAGNOSIS — I493 Ventricular premature depolarization: Secondary | ICD-10-CM | POA: Diagnosis not present

## 2016-12-16 DIAGNOSIS — Z95810 Presence of automatic (implantable) cardiac defibrillator: Secondary | ICD-10-CM | POA: Diagnosis not present

## 2016-12-16 DIAGNOSIS — I428 Other cardiomyopathies: Secondary | ICD-10-CM | POA: Diagnosis not present

## 2016-12-16 DIAGNOSIS — I5022 Chronic systolic (congestive) heart failure: Secondary | ICD-10-CM | POA: Diagnosis not present

## 2016-12-16 MED ORDER — FUROSEMIDE 20 MG PO TABS: 20.0000 mg | ORAL_TABLET | Freq: Every day | ORAL | 6 refills | Status: DC

## 2016-12-16 MED ORDER — VALSARTAN 80 MG PO TABS: 80.0000 mg | ORAL_TABLET | Freq: Every day | ORAL | Status: DC

## 2016-12-16 MED ORDER — BISOPROLOL FUMARATE 10 MG PO TABS: ORAL_TABLET | ORAL | Status: DC

## 2016-12-16 MED ORDER — EPLERENONE 25 MG PO TABS: 12.5000 mg | ORAL_TABLET | Freq: Every day | ORAL | 2 refills | Status: DC

## 2016-12-16 NOTE — Telephone Encounter (Signed)
I spoke with the pharmacist- she is aware that Dr. Graciela Husbands wanted the patient on both lasix and inspra.

## 2016-12-16 NOTE — Patient Instructions (Addendum)
Medication Instructions: Your physician has recommended you make the following change in your medication:  1) Stop mexiletine 2) Decrease bisoprolol 10 mg - take 1/2 tablet (5 mg) by mouth once daily 3) Diovan (valsartan) 80 mg- take 1 tablet daily at bedtime 4) Start inspra (epleronone) 25 mg- take 1/2 tablet (12.5 mg) once daily 5) Start lasix (furosemide) 20 mg- take 1 tablet by mouth once daily  Labwork: - Your physician recommends that you return for lab work in: 2 weeks- BMP  Procedures/Testing: - Your physician has recommended that you have an AV optimization echo + complete echo- St. Jude (schedule on a day Dr. Graciela Husbands is in the office). During this procedure, an echocardiogram is performed to optimize the timing of your device using ultrasound and a device programmer. Changes will be made to the device settings to help the heart chambers pump more efficiently. This procedure takes approximately one hour.  - Your physician has recommended that you wear a 24 hour holter monitor. Holter monitors are medical devices that record the heart's electrical activity. Doctors most often use these monitors to diagnose arrhythmias. Arrhythmias are problems with the speed or rhythm of the heartbeat. The monitor is a small, portable device. You can wear one while you do your normal daily activities. This is usually used to diagnose what is causing palpitations/syncope (passing out).   Follow-Up: - Your physician recommends that you schedule a follow-up appointment in: 3 months with Gypsy Balsam, NP for Dr. Graciela Husbands.   Any Additional Special Instructions Will Be Listed Below (If Applicable).     If you need a refill on your cardiac medications before your next appointment, please call your pharmacy.

## 2016-12-16 NOTE — Progress Notes (Signed)
Patient Care Team: Johny Blamer, MD as PCP - General (Family Medicine)   HPI  Alison Howard is a 56 y.o. female Seen in followup for CRT-D implanted at [facility] for cardiomyopathy presumed nonischemic of >5 yrs duration   She was seen in 6/15 following appropriate therapies for VT-ATP   There were no associated symptoms   She is using CPAP   She continues to complain of shortness of breath which is worsening. She does not have nocturnal dyspnea she's had some peripheral edema.  She has some positional dizziness. She's had no syncope.  DATE TEST    8/13  Echo Ef 40-45%   12/15    Echo   EF 25-30 %   6/17    Echo EF 35%          She's also had a significant PVC burden.\  DATE PVC %   8/17 11 Multiple morphologies       Date Cr K Mg  5/17  0.8 4.1 2.1             Past Medical History:  Diagnosis Date  . 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 Cardiomyopathy (HCC) 04/25/2013  . Chronic systolic heart failure (HCC) 04/25/2013  . GERD (gastroesophageal reflux disease)   . Hypercholesteremia   . LBBB (left bundle branch block)   . Morbid obesity (HCC) 12/28/2014  . NICM (nonischemic cardiomyopathy) (HCC)    dialted  . OSA (obstructive sleep apnea) 12/28/2014   Severe with AHI 27 events per hour on average and the highest AHI was 40 events per hour    Past Surgical History:  Procedure Laterality Date  . PACEMAKER PLACEMENT    . TOTAL ABDOMINAL HYSTERECTOMY      Current Outpatient Prescriptions  Medication Sig Dispense Refill  . bisoprolol (ZEBETA) 10 MG tablet TAKE 1 TABLET (10 MG TOTAL) BY MOUTH DAILY. 90 tablet 3  . citalopram (CELEXA) 20 MG tablet Take 20 mg by mouth daily.    Marland Kitchen mexiletine (MEXITIL) 200 MG capsule TAKE ONE CAPSULE BY MOUTH TWICE A DAY 180 capsule 0  . pantoprazole (PROTONIX) 40 MG tablet Take 40 mg by mouth 2 (two) times daily.     . valsartan (DIOVAN) 80 MG tablet Take 1 tablet (80 mg total) by mouth daily. 90  tablet 3   No current facility-administered medications for this visit.     Allergies  Allergen Reactions  . Ciprofloxacin Hcl Anaphylaxis  . Codeine Anaphylaxis and Nausea And Vomiting  . Morphine And Related Anaphylaxis  . Sulfa Antibiotics Anaphylaxis    Review of Systems negative except from HPI and PMH  Physical Exam BP 112/90   Pulse 71   Ht 5\' 2"  (1.575 m)   Wt 235 lb (106.6 kg)   SpO2 99%   BMI 42.98 kg/m  Well developed and well nourished in no acute distress HENT normal E scleral and icterus clear Neck Supple JVP 7-8 cm  carotids brisk and full Clear to ausculation Irregular rate and rhythm, no murmurs gallops or rub Soft with active bowel sounds No clubbing cyanosis no  Edema Alert and oriented, grossly normal motor and sensory function Skin Warm and Dry   ECG demonstrates AV biventricular pacing  3 PVCs were noted QRS morphology was QR lead 1 and R lead V1 Sense AV interval appears to be about 110 ms  Assessment and  Plan  Nonischemic cardiomyopathy  Congestive heart failure-acute chronic-systolic  PVCs  Sleep disordered breathing CPAP  Morbidly obese  Treated hypothyroidism   Her exercise intolerance is likely multifactorial. Potential contributors include her obesity, congestive heart failure as suggested by volume overload, cardiomyopathy likely aggravated by her PVCs. Furthermore, she has a very short sense AV interval at 70 ms.  We will increase it from 70-90.  To augment medications for cardiomyopathy we will decrease her bisoprolol 10--5 and add   eplerenone 12.5; she has not tolerated Aldactone the past). We reviewed side effects and will check a metabolic profile in 2 weeks time  We will obtain an echocardiogram to look at both left ventricular function as well as AV optimization. I'm concerned about truncation of the mitral inflow  We will discontinue the mexiletine given and 9-10% PVC burden that is ongoing. A recent paper looked at  the role of flecainide in this cohort. There was a significant benefit in terms of reduction of PVC burden. We will have to do this carefully given its negative inotropic properties  We will also prior to the initiation of flecainide undertake a Holter monitor to look again at the morphologies of the PVCs. In the past they have been multiple. This makes the possibility of ablation much less successful and hence less attractive  More than 50% of 40 min was spent in counseling related to the above

## 2016-12-16 NOTE — Telephone Encounter (Signed)
New message    Alison Howard is calling to find out if both lasix and inspra should be added to pt med list.

## 2016-12-17 ENCOUNTER — Ambulatory Visit (INDEPENDENT_AMBULATORY_CARE_PROVIDER_SITE_OTHER): Payer: BLUE CROSS/BLUE SHIELD

## 2016-12-17 DIAGNOSIS — I493 Ventricular premature depolarization: Secondary | ICD-10-CM

## 2016-12-21 ENCOUNTER — Other Ambulatory Visit: Payer: Self-pay | Admitting: Internal Medicine

## 2016-12-23 LAB — CUP PACEART INCLINIC DEVICE CHECK
Brady Statistic RA Percent Paced: 1.2 %
Brady Statistic RV Percent Paced: 91 %
Date Time Interrogation Session: 20180522192623
HighPow Impedance: 51.75 Ohm
Implantable Lead Implant Date: 20120529
Implantable Lead Implant Date: 20120529
Implantable Lead Implant Date: 20120529
Implantable Lead Location: 753858
Implantable Lead Location: 753859
Implantable Lead Location: 753860
Implantable Pulse Generator Implant Date: 20120529
Lead Channel Impedance Value: 462.5 Ohm
Lead Channel Impedance Value: 462.5 Ohm
Lead Channel Impedance Value: 950 Ohm
Lead Channel Pacing Threshold Amplitude: 0.75 V
Lead Channel Pacing Threshold Amplitude: 1 V
Lead Channel Pacing Threshold Amplitude: 2 V
Lead Channel Pacing Threshold Pulse Width: 0.5 ms
Lead Channel Pacing Threshold Pulse Width: 0.5 ms
Lead Channel Pacing Threshold Pulse Width: 0.5 ms
Lead Channel Sensing Intrinsic Amplitude: 11.6 mV
Lead Channel Sensing Intrinsic Amplitude: 4.1 mV
Lead Channel Setting Pacing Amplitude: 2 V
Lead Channel Setting Pacing Amplitude: 2.875
Lead Channel Setting Pacing Amplitude: 3.125
Lead Channel Setting Pacing Pulse Width: 0.5 ms
Lead Channel Setting Pacing Pulse Width: 0.5 ms
Lead Channel Setting Sensing Sensitivity: 0.5 mV
Pulse Gen Serial Number: 642812

## 2016-12-23 MED ORDER — VALSARTAN 80 MG PO TABS
80.0000 mg | ORAL_TABLET | Freq: Every day | ORAL | 3 refills | Status: DC
Start: 2016-12-23 — End: 2017-03-10

## 2016-12-30 ENCOUNTER — Ambulatory Visit (HOSPITAL_COMMUNITY): Payer: BLUE CROSS/BLUE SHIELD | Attending: Cardiovascular Disease

## 2016-12-30 ENCOUNTER — Other Ambulatory Visit: Payer: Self-pay

## 2016-12-30 ENCOUNTER — Other Ambulatory Visit: Payer: BLUE CROSS/BLUE SHIELD | Admitting: *Deleted

## 2016-12-30 ENCOUNTER — Other Ambulatory Visit: Payer: Self-pay | Admitting: *Deleted

## 2016-12-30 DIAGNOSIS — Z6841 Body Mass Index (BMI) 40.0 and over, adult: Secondary | ICD-10-CM | POA: Insufficient documentation

## 2016-12-30 DIAGNOSIS — I517 Cardiomegaly: Secondary | ICD-10-CM | POA: Insufficient documentation

## 2016-12-30 DIAGNOSIS — I5022 Chronic systolic (congestive) heart failure: Secondary | ICD-10-CM

## 2016-12-30 DIAGNOSIS — I428 Other cardiomyopathies: Secondary | ICD-10-CM

## 2016-12-30 DIAGNOSIS — I493 Ventricular premature depolarization: Secondary | ICD-10-CM | POA: Diagnosis not present

## 2016-12-30 DIAGNOSIS — I959 Hypotension, unspecified: Secondary | ICD-10-CM | POA: Insufficient documentation

## 2016-12-30 DIAGNOSIS — I509 Heart failure, unspecified: Secondary | ICD-10-CM | POA: Diagnosis present

## 2016-12-30 MED ORDER — FLECAINIDE ACETATE 50 MG PO TABS: 50.0000 mg | ORAL_TABLET | Freq: Two times a day (BID) | ORAL | 6 refills | Status: DC

## 2016-12-31 LAB — BASIC METABOLIC PANEL
BUN/Creatinine Ratio: 18 (ref 9–23)
BUN: 14 mg/dL (ref 6–24)
CO2: 29 mmol/L (ref 18–29)
Calcium: 9.2 mg/dL (ref 8.7–10.2)
Chloride: 99 mmol/L (ref 96–106)
Creatinine, Ser: 0.78 mg/dL (ref 0.57–1.00)
GFR calc Af Amer: 98 mL/min/{1.73_m2} (ref >59)
GFR calc non Af Amer: 85 mL/min/{1.73_m2} (ref >59)
Glucose: 82 mg/dL (ref 65–99)
Potassium: 4.2 mmol/L (ref 3.5–5.2)
Sodium: 140 mmol/L (ref 134–144)

## 2017-01-05 ENCOUNTER — Telehealth: Payer: Self-pay | Admitting: Internal Medicine

## 2017-01-05 NOTE — Telephone Encounter (Signed)
New message    Returning Alison Howard's call for lab results

## 2017-01-05 NOTE — Telephone Encounter (Signed)
Reviewed results of lab work and echo with patient who verbalized understanding and was grateful for the call

## 2017-01-16 ENCOUNTER — Telehealth: Payer: Self-pay | Admitting: Cardiology

## 2017-01-16 ENCOUNTER — Ambulatory Visit (INDEPENDENT_AMBULATORY_CARE_PROVIDER_SITE_OTHER): Payer: BLUE CROSS/BLUE SHIELD

## 2017-01-16 DIAGNOSIS — Z95810 Presence of automatic (implantable) cardiac defibrillator: Secondary | ICD-10-CM | POA: Diagnosis not present

## 2017-01-16 DIAGNOSIS — I5022 Chronic systolic (congestive) heart failure: Secondary | ICD-10-CM | POA: Diagnosis not present

## 2017-01-16 NOTE — Progress Notes (Signed)
EPIC Encounter for ICM Monitoring  Patient Name: Alison Howard is a 56 y.o. female Date: 01/16/2017 Primary Care Physican: Shirline Frees, MD Primary [place] Electrophysiologist: Faustino Congress Weight:does not weigh Bi-V Pacing: 92%                                                 Heart Failure questions reviewed, pt asymptomatic.   Thoracic impedance abnormal suggesting fluid accumulation.  Prescribed Furosemide 20 mg 1 tablet daily.  Labs: 12/30/2016 Creatinine 0.78, BUN 14, Potassium 4.2, Sodium 140, EGFR 85-98  Recommendations: Advised to increase Furosemide to 20 mg 1 tablet x 2 days and return to 1 tablet daily after 2nd day.    Follow-up plan: ICM clinic phone appointment on 02/23/2017   Office appointment scheduled with Dr. Caryl Comes 01/22/2017 and fluid levels will be rechecked at that time.   Copy of ICM check sent to device physician.   3 month ICM trend: 01/16/2017   1 Year ICM trend:      [name], RN 01/16/2017 11:58 AM

## 2017-01-16 NOTE — Telephone Encounter (Signed)
Spoke with pt and reminded pt of remote transmission that is due today. Pt verbalized understanding.   

## 2017-01-23 ENCOUNTER — Encounter: Payer: Self-pay | Admitting: Internal Medicine

## 2017-01-23 ENCOUNTER — Ambulatory Visit (INDEPENDENT_AMBULATORY_CARE_PROVIDER_SITE_OTHER): Payer: BLUE CROSS/BLUE SHIELD | Admitting: Internal Medicine

## 2017-01-23 VITALS — BP 100/64 | HR 83 | Ht 62.0 in | Wt 235.2 lb

## 2017-01-23 DIAGNOSIS — I428 Other cardiomyopathies: Secondary | ICD-10-CM | POA: Diagnosis not present

## 2017-01-23 DIAGNOSIS — I493 Ventricular premature depolarization: Secondary | ICD-10-CM

## 2017-01-23 DIAGNOSIS — I5022 Chronic systolic (congestive) heart failure: Secondary | ICD-10-CM

## 2017-01-23 DIAGNOSIS — Z95810 Presence of automatic (implantable) cardiac defibrillator: Secondary | ICD-10-CM | POA: Diagnosis not present

## 2017-01-23 NOTE — Patient Instructions (Addendum)
Medication Instructions: - Your physician recommends that you continue on your current medications as directed. Please refer to the Current Medication list given to you today.  Labwork: - none ordered  Procedures/Testing: - none ordered  Follow-Up: - Remote monitoring is used to monitor your Pacemaker of ICD from home. This monitoring reduces the number of office visits required to check your device to one time per year. It allows [place] to keep an eye on the functioning of your device to ensure it is working properly. You are scheduled for a device check from home on 02/23/17. You may send your transmission at any time that day. If you have a wireless device, the transmission will be sent automatically. After your physician reviews your transmission, you will receive a postcard with your next transmission date.  - Your physician wants you to follow-up in: 3 months with Dr. Graciela Husbands. You will receive a reminder letter in the mail two months in advance. If you don't receive a letter, please call our office to schedule the follow-up appointment.   Any Additional Special Instructions Will Be Listed Below (If Applicable).     If you need a refill on your cardiac medications before your next appointment, please call your pharmacy.

## 2017-01-23 NOTE — Progress Notes (Signed)
Patient Care Team: Johny Blamer, MD as PCP - General (Family Medicine)   HPI  Alison Howard is a 56 y.o. female Seen in followup for CRT-D implanted at [facility] for cardiomyopathy presumed nonischemic of >5 yrs duration   She was seen in 6/15 following appropriate therapies for VT-ATP   There were no associated symptoms   She is using CPAP   She continues with shortness of breath albeit somewhat better. She has had her diuretics adjusted by the [facility] clinic. Sh   She has some positional dizziness. She's had no syncope.  DATE TEST    8/13  Echo Ef 40-45%   12/15    Echo   EF 25-30 %   6/17    Echo EF 35%   6/18 Echo EF 40-45%        DATE PVC %   8/17 11 Multiple morphologies  6/18 22  Multiple morphologies   Date Cr K Mg  5/17  0.8 4.1 2.1   6/18  0.78 4.2     With PVC burden was elected to put her on flecainide. She is tolerating the drug now without difficulties. She has noted no significant change in status.  Past Medical History:  Diagnosis Date  . 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 Cardiomyopathy (HCC) 04/25/2013  . Chronic systolic heart failure (HCC) 04/25/2013  . GERD (gastroesophageal reflux disease)   . Hypercholesteremia   . LBBB (left bundle branch block)   . Morbid obesity (HCC) 12/28/2014  . NICM (nonischemic cardiomyopathy) (HCC)    dialted  . OSA (obstructive sleep apnea) 12/28/2014   Severe with AHI 27 events per hour on average and the highest AHI was 40 events per hour    Past Surgical History:  Procedure Laterality Date  . PACEMAKER PLACEMENT    . TOTAL ABDOMINAL HYSTERECTOMY      Current Outpatient Prescriptions  Medication Sig Dispense Refill  . bisoprolol (ZEBETA) 10 MG tablet Take 1/2 tablet (5 mg) by mouth once daily    . citalopram (CELEXA) 20 MG tablet Take 20 mg by mouth daily.    Marland Kitchen eplerenone (INSPRA) 25 MG tablet Take 0.5 tablets (12.5 mg total) by mouth daily. 30 tablet 2  . flecainide  (TAMBOCOR) 50 MG tablet Take 1 tablet (50 mg total) by mouth 2 (two) times daily. 60 tablet 6  . furosemide (LASIX) 20 MG tablet Take 20 mg by mouth 2 (two) times daily.  5  . pantoprazole (PROTONIX) 40 MG tablet Take 40 mg by mouth 2 (two) times daily.     . valsartan (DIOVAN) 80 MG tablet Take 1 tablet (80 mg total) by mouth at bedtime. 90 tablet 3   No current facility-administered medications for this visit.     Allergies  Allergen Reactions  . Ciprofloxacin Hcl Anaphylaxis  . Codeine Anaphylaxis and Nausea And Vomiting  . Morphine And Related Anaphylaxis  . Sulfa Antibiotics Anaphylaxis    Review of Systems negative except from HPI and PMH  Physical Exam BP 100/64 (BP Location: Right Arm)   Pulse 83   Ht 5\' 2"  (1.575 m)   Wt 235 lb 3.2 oz (106.7 kg)   BMI 43.02 kg/m  Well developed and well nourished in no acute distress HENT normal E scleral and icterus clear Neck Supple JVP 7-8 cm  carotids brisk and full Clear to ausculation Irregular rate and rhythm, no murmurs gallops or rub Soft with active bowel  sounds No clubbing cyanosis no  Edema Alert and oriented, grossly normal motor and sensory function Skin Warm and Dry   ECG demonstrates AV biventricular pacing  2 PVCs were noted QRS morphology was QR lead 1 and R lead V1 Sense AV interval appears to be about 110 ms  Assessment and  Plan  Nonischemic cardiomyopathy  Congestive heart failure-acute chronic-systolic  PVCs  Sleep disordered breathing CPAP  Morbidly obese\   Her PVC count today is 10%. This suggests that there may have been some interval benefit from the flecainide as was started about halfway through the interval. We will plan to reset her encounters and check her again in about 4 weeks. In the event that we have accomplished significant PVC suppression we would anticipate an echocardiogram in the event that we have not, we will increase her flecainide dose.  We spent more than 50% of our >25  min visit in face to face counseling regarding the above

## 2017-01-26 ENCOUNTER — Other Ambulatory Visit: Payer: Self-pay | Admitting: Internal Medicine

## 2017-01-26 LAB — CUP PACEART INCLINIC DEVICE CHECK
Battery Remaining Longevity: 13 mo
Brady Statistic RA Percent Paced: 0.37 %
Brady Statistic RV Percent Paced: 80 %
Date Time Interrogation Session: 20180629180418
HighPow Impedance: 49.5553
Implantable Lead Implant Date: 20120529
Implantable Lead Implant Date: 20120529
Implantable Lead Implant Date: 20120529
Implantable Lead Location: 753858
Implantable Lead Location: 753859
Implantable Lead Location: 753860
Implantable Pulse Generator Implant Date: 20120529
Lead Channel Impedance Value: 462.5 Ohm
Lead Channel Impedance Value: 475 Ohm
Lead Channel Impedance Value: 900 Ohm
Lead Channel Pacing Threshold Amplitude: 0.75 V
Lead Channel Pacing Threshold Amplitude: 0.75 V
Lead Channel Pacing Threshold Amplitude: 1 V
Lead Channel Pacing Threshold Amplitude: 1 V
Lead Channel Pacing Threshold Amplitude: 1.75 V
Lead Channel Pacing Threshold Amplitude: 1.75 V
Lead Channel Pacing Threshold Pulse Width: 0.5 ms
Lead Channel Pacing Threshold Pulse Width: 0.5 ms
Lead Channel Pacing Threshold Pulse Width: 0.5 ms
Lead Channel Pacing Threshold Pulse Width: 0.5 ms
Lead Channel Pacing Threshold Pulse Width: 0.5 ms
Lead Channel Pacing Threshold Pulse Width: 0.5 ms
Lead Channel Sensing Intrinsic Amplitude: 12 mV
Lead Channel Sensing Intrinsic Amplitude: 4.7 mV
Lead Channel Setting Pacing Amplitude: 2 V
Lead Channel Setting Pacing Amplitude: 2 V
Lead Channel Setting Pacing Amplitude: 2.625
Lead Channel Setting Pacing Pulse Width: 0.5 ms
Lead Channel Setting Pacing Pulse Width: 0.5 ms
Lead Channel Setting Sensing Sensitivity: 0.5 mV
Pulse Gen Serial Number: 642812

## 2017-02-23 ENCOUNTER — Telehealth: Payer: Self-pay

## 2017-02-23 ENCOUNTER — Ambulatory Visit (INDEPENDENT_AMBULATORY_CARE_PROVIDER_SITE_OTHER): Payer: BLUE CROSS/BLUE SHIELD

## 2017-02-23 ENCOUNTER — Ambulatory Visit (INDEPENDENT_AMBULATORY_CARE_PROVIDER_SITE_OTHER): Payer: Self-pay | Admitting: *Deleted

## 2017-02-23 DIAGNOSIS — Z95810 Presence of automatic (implantable) cardiac defibrillator: Secondary | ICD-10-CM | POA: Diagnosis not present

## 2017-02-23 DIAGNOSIS — I5022 Chronic systolic (congestive) heart failure: Secondary | ICD-10-CM

## 2017-02-23 DIAGNOSIS — I428 Other cardiomyopathies: Secondary | ICD-10-CM

## 2017-02-23 NOTE — Telephone Encounter (Signed)
Remote ICM transmission received.  Attempted patient call and left message to return call.   

## 2017-02-23 NOTE — Progress Notes (Signed)
EPIC Encounter for ICM Monitoring  Patient Name: Alison Howard is a 56 y.o. female Date: 02/23/2017 Primary Care Physican: Shirline Frees, MD Primary [place] Electrophysiologist: Faustino Congress Weight:does not weigh Bi-V Pacing: 82% (decresed from 92%on last ICM transmission 01/23/2017)            Patient returned call and she reported feeling fine.  She denied any fluid symptoms.   Thoracic impedance abnormal suggesting fluid accumulation starting 02/20/2017.   Also decreased impedance from 7/6 to 7/11.  Patient stated she ate out a few times in the last week and including [place] food.  She also forgot to take her 2nd fluid pill on Sunday.   Prescribed dosage: Furosemide 20 mg 1 tablet twice daily.    Labs: 12/30/2016 Creatinine 0.78, BUN 14, Potassium 4.2, Sodium 140, EGFR 85-98  Recommendations:  Advised to take Furosemide 20 mg 2 tablets in AM and 1 tablet PM x 2 days and then return to 1 tablet twice a day.    Follow-up plan: ICM clinic phone appointment on 02/26/2017 to recheck fluid levels.  Office appointment scheduled 04/28/2017 with Dr. Caryl Comes.  Copy of ICM check sent to Dr Caryl Comes.   3 month ICM trend: 02/23/2017   1 Year ICM trend:      [name], RN 02/23/2017 9:23 AM

## 2017-02-23 NOTE — Progress Notes (Signed)
Remote ICD transmission.   

## 2017-02-26 ENCOUNTER — Ambulatory Visit (INDEPENDENT_AMBULATORY_CARE_PROVIDER_SITE_OTHER): Payer: Self-pay

## 2017-02-26 DIAGNOSIS — I5022 Chronic systolic (congestive) heart failure: Secondary | ICD-10-CM

## 2017-02-26 DIAGNOSIS — Z95810 Presence of automatic (implantable) cardiac defibrillator: Secondary | ICD-10-CM

## 2017-02-26 NOTE — Progress Notes (Signed)
EPIC Encounter for ICM Monitoring  Patient Name: Alison Howard is a 56 y.o. female Date: 02/26/2017 Primary Care Physican: Shirline Frees, MD Primary [place] Electrophysiologist: Faustino Congress Weight:does not weigh Bi-V Pacing: 82%        Heart Failure questions reviewed, pt asymptomatic .   Thoracic impedance returned to normal after taking extra Furosemide x 2 days.   Prescribed dosage: Furosemide 20 mg 1 tablet twice daily.    Labs: 12/30/2016 Creatinine 0.78, BUN 14, Potassium 4.2, Sodium 140, EGFR 85-98  Recommendations: No changes.   Encouraged to call for fluid symptoms.  Follow-up plan: ICM clinic phone appointment on 03/26/2017.  Office appointment scheduled 04/28/2017 with Dr. Caryl Comes.  Copy of ICM check sent to device physician.   3 month ICM trend: 02/26/2017   1 Year ICM trend:      [name], RN 02/26/2017 11:31 AM

## 2017-02-27 ENCOUNTER — Encounter: Payer: Self-pay | Admitting: Cardiology

## 2017-03-02 ENCOUNTER — Other Ambulatory Visit: Payer: Self-pay

## 2017-03-02 MED ORDER — FUROSEMIDE 20 MG PO TABS: 20.0000 mg | ORAL_TABLET | Freq: Two times a day (BID) | ORAL | 10 refills | Status: DC

## 2017-03-07 ENCOUNTER — Encounter: Payer: Self-pay | Admitting: Internal Medicine

## 2017-03-09 ENCOUNTER — Telehealth: Payer: Self-pay

## 2017-03-09 NOTE — Telephone Encounter (Signed)
Returned call as requested by voice mail message regarding Valsartan.  Left message stating Dr Odessa Fleming nurse has received her email message regarding replacing Valsartan due to recall and the nurse will contact her when she is back in the office on 03/10/2017

## 2017-03-10 MED ORDER — IRBESARTAN 75 MG PO TABS: 75.0000 mg | ORAL_TABLET | Freq: Every day | ORAL | 3 refills | Status: DC

## 2017-03-10 NOTE — Telephone Encounter (Signed)
Spoke with patient via Mychart encounter.  Valsartan changed to irbesartan 75 mg once daily. RX sent to [facility] on [address].

## 2017-03-26 ENCOUNTER — Ambulatory Visit (INDEPENDENT_AMBULATORY_CARE_PROVIDER_SITE_OTHER): Payer: BLUE CROSS/BLUE SHIELD

## 2017-03-26 DIAGNOSIS — Z95810 Presence of automatic (implantable) cardiac defibrillator: Secondary | ICD-10-CM | POA: Diagnosis not present

## 2017-03-26 DIAGNOSIS — I5022 Chronic systolic (congestive) heart failure: Secondary | ICD-10-CM

## 2017-03-26 LAB — CUP PACEART REMOTE DEVICE CHECK
Battery Remaining Longevity: 11 mo
Battery Remaining Percentage: 14 %
Battery Voltage: 2.72 V
Brady Statistic AP VP Percent: 4.5 %
Brady Statistic AP VS Percent: 1 %
Brady Statistic AS VP Percent: 78 %
Brady Statistic AS VS Percent: 11 %
Brady Statistic RA Percent Paced: 1 %
Date Time Interrogation Session: 20180730082048
HighPow Impedance: 46 Ohm
Implantable Lead Implant Date: 20120529
Implantable Lead Implant Date: 20120529
Implantable Lead Implant Date: 20120529
Implantable Lead Location: 753858
Implantable Lead Location: 753859
Implantable Lead Location: 753860
Implantable Pulse Generator Implant Date: 20120529
Lead Channel Impedance Value: 450 Ohm
Lead Channel Impedance Value: 450 Ohm
Lead Channel Impedance Value: 890 Ohm
Lead Channel Pacing Threshold Amplitude: 0.75 V
Lead Channel Pacing Threshold Amplitude: 1 V
Lead Channel Pacing Threshold Amplitude: 1.875 V
Lead Channel Pacing Threshold Pulse Width: 0.5 ms
Lead Channel Pacing Threshold Pulse Width: 0.5 ms
Lead Channel Pacing Threshold Pulse Width: 0.5 ms
Lead Channel Sensing Intrinsic Amplitude: 3.6 mV
Lead Channel Sensing Intrinsic Amplitude: 6.7 mV
Lead Channel Setting Pacing Amplitude: 2 V
Lead Channel Setting Pacing Amplitude: 2 V
Lead Channel Setting Pacing Amplitude: 2.875
Lead Channel Setting Pacing Pulse Width: 0.5 ms
Lead Channel Setting Pacing Pulse Width: 0.5 ms
Lead Channel Setting Sensing Sensitivity: 0.5 mV
Pulse Gen Serial Number: 642812

## 2017-03-26 NOTE — Progress Notes (Signed)
EPIC Encounter for ICM Monitoring  Patient Name: Alison Howard is a 55 y.o. female Date: 03/26/2017 Primary Care Physican: Shirline Frees, MD Primary [place] Electrophysiologist: Faustino Congress Weight:does not weigh Bi-V Pacing: 84%           Heart Failure questions reviewed, pt asymptomatic.   Thoracic impedance normal.  Prescribed dosage: Furosemide 20 mg 1 tablet twice daily.   Labs: 12/30/2016 Creatinine 0.78, BUN 14, Potassium 4.2, Sodium 140, EGFR 85-98  Recommendations: No changes.   Encouraged to call for fluid symptoms.  Follow-up plan: ICM clinic phone appointment on 05/29/2017. Office appointment scheduled 04/28/2017 with Dr. Caryl Comes.  Copy of ICM check sent to Dr. Caryl Comes.   3 month ICM trend: 03/26/2017   1 Year ICM trend:      [name], RN 03/26/2017 9:46 AM

## 2017-04-06 ENCOUNTER — Other Ambulatory Visit: Payer: Self-pay

## 2017-04-06 MED ORDER — EPLERENONE 25 MG PO TABS: 12.5000 mg | ORAL_TABLET | Freq: Every day | ORAL | 2 refills | Status: DC

## 2017-04-09 ENCOUNTER — Other Ambulatory Visit: Payer: Self-pay

## 2017-04-09 MED ORDER — EPLERENONE 25 MG PO TABS: 12.5000 mg | ORAL_TABLET | Freq: Every day | ORAL | 0 refills | Status: DC

## 2017-04-13 DIAGNOSIS — Z803 Family history of malignant neoplasm of breast: Secondary | ICD-10-CM | POA: Diagnosis not present

## 2017-04-13 DIAGNOSIS — Z1231 Encounter for screening mammogram for malignant neoplasm of breast: Secondary | ICD-10-CM | POA: Diagnosis not present

## 2017-04-28 ENCOUNTER — Encounter: Payer: BLUE CROSS/BLUE SHIELD | Admitting: Internal Medicine

## 2017-05-13 ENCOUNTER — Ambulatory Visit (INDEPENDENT_AMBULATORY_CARE_PROVIDER_SITE_OTHER): Payer: BLUE CROSS/BLUE SHIELD | Admitting: Internal Medicine

## 2017-05-13 VITALS — BP 100/66 | HR 73 | Resp 16 | Ht 62.5 in | Wt 232.6 lb

## 2017-05-13 DIAGNOSIS — I429 Cardiomyopathy, unspecified: Secondary | ICD-10-CM | POA: Diagnosis not present

## 2017-05-13 DIAGNOSIS — I5022 Chronic systolic (congestive) heart failure: Secondary | ICD-10-CM | POA: Diagnosis not present

## 2017-05-13 NOTE — Progress Notes (Signed)
Patient Care Team: Johny Blamer, MD as PCP - General (Family Medicine)   HPI  Alison Howard is a 56 y.o. female Seen in followup for CRT-D implanted at [facility] for cardiomyopathy presumed nonischemic of >5 yrs duration   She was seen in 6/15 following appropriate therapies for VT-ATP   There were no associated symptoms   She is using CPAP . er  DATE TEST    8/13  Echo Ef 40-45%   12/15    Echo   EF 25-30 %   6/17    Echo EF 35%   6/18 Echo EF 40-45%             DATE PVC %   8/17 11 Multiple morphologies  6/18 22  Multiple morphologies  10/18 5.2 From device    Date Cr K Mg  5/17  0.8 4.1 2.1   6/18  0.78 4.2     She is tolerating flecainide. She has occasional peripheral edema. Dyspneic stable. No chest pain.  Past Medical History:  Diagnosis Date  . 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 Cardiomyopathy (HCC) 04/25/2013  . Chronic systolic heart failure (HCC) 04/25/2013  . GERD (gastroesophageal reflux disease)   . Hypercholesteremia   . LBBB (left bundle branch block)   . Morbid obesity (HCC) 12/28/2014  . NICM (nonischemic cardiomyopathy) (HCC)    dialted  . OSA (obstructive sleep apnea) 12/28/2014   Severe with AHI 27 events per hour on average and the highest AHI was 40 events per hour    Past Surgical History:  Procedure Laterality Date  . PACEMAKER PLACEMENT    . TOTAL ABDOMINAL HYSTERECTOMY      Current Outpatient Prescriptions  Medication Sig Dispense Refill  . bisoprolol (ZEBETA) 5 MG tablet Take 5 mg by mouth daily.    . citalopram (CELEXA) 20 MG tablet Take 20 mg by mouth daily.    Marland Kitchen eplerenone (INSPRA) 25 MG tablet Take 0.5 tablets (12.5 mg total) by mouth daily. 90 tablet 0  . flecainide (TAMBOCOR) 50 MG tablet Take 1 tablet (50 mg total) by mouth 2 (two) times daily. 60 tablet 6  . furosemide (LASIX) 20 MG tablet Take 1 tablet (20 mg total) by mouth 2 (two) times daily. 60 tablet 10  . irbesartan (AVAPRO) 75 MG  tablet Take 1 tablet (75 mg total) by mouth daily. 90 tablet 3  . pantoprazole (PROTONIX) 40 MG tablet Take 40 mg by mouth 2 (two) times daily.      No current facility-administered medications for this visit.     Allergies  Allergen Reactions  . Ciprofloxacin Hcl Anaphylaxis  . Codeine Anaphylaxis and Nausea And Vomiting  . Morphine And Related Anaphylaxis  . Sulfa Antibiotics Anaphylaxis    Review of Systems negative except from HPI and PMH  Physical Exam BP 100/66   Pulse 73   Resp 16   Ht 5' 2.5" (1.588 m)   Wt 232 lb 9.6 oz (105.5 kg)   SpO2 94%   BMI 41.87 kg/m  Well developed and nourished in no acute distress HENT normal Neck supple with JVP-flat Carotids brisk and full without bruits Clear Regular rate and rhythm, no murmurs or gallops Abd-soft with active BS without hepatomegaly No Clubbing cyanosis edema Skin-warm and dry A & Oriented  Grossly normal sensory and motor function    ECG demonstrates AV biventricular pacing    QRS upright   lead V1  Assessment and  Plan  Nonischemic cardiomyopathy  Congestive heart failure- chronic-systolic  PVCs  Morbidly obese    we will check it again in 3 months as she approaches device in ERI and will repeat assess LV function by echo.   electrolytes were normal June 2018 we'll check him also again in 3 months.   Euvolemic continue current meds

## 2017-05-13 NOTE — Patient Instructions (Addendum)
Medication Instructions:  Your physician recommends that you continue on your current medications as directed. Please refer to the Current Medication list given to you today.  -- If you need a refill on your cardiac medications before your next appointment, please call your pharmacy. --  Labwork: None ordered  Testing/Procedures: Your physician has requested that you have an echocardiogram. Echocardiography is a painless test that uses sound waves to create images of your heart. It provides your doctor with information about the size and shape of your heart and how well your heart's chambers and valves are working. This procedure takes approximately one hour. There are no restrictions for this procedure.    Follow-Up: Your physician wants you to follow-up in: 3 months with Gypsy Balsam.  You will receive a reminder letter in the mail two months in advance. If you don't receive a letter, please call our office to schedule the follow-up appointment.  Remote monitoring is used to monitor your ICD from home. This monitoring reduces the number of office visits required to check your device to one time per year. It allows [place] to keep an eye on the functioning of your device to ensure it is working properly. You are scheduled for a device check from home on 08/12/2017. You may send your transmission at any time that day. If you have a wireless device, the transmission will be sent automatically. After your physician reviews your transmission, you will receive a postcard with your next transmission date.    Thank you for choosing [facility]!!    [phone]  Any Other Special Instructions Will Be Listed Below (If Applicable).

## 2017-05-28 LAB — CUP PACEART INCLINIC DEVICE CHECK
Battery Remaining Longevity: 8 mo
Brady Statistic RA Percent Paced: 0.15 %
Brady Statistic RV Percent Paced: 87 %
Date Time Interrogation Session: 20181017203930
HighPow Impedance: 54.5625
Implantable Lead Implant Date: 20120529
Implantable Lead Implant Date: 20120529
Implantable Lead Implant Date: 20120529
Implantable Lead Location: 753858
Implantable Lead Location: 753859
Implantable Lead Location: 753860
Implantable Pulse Generator Implant Date: 20120529
Lead Channel Impedance Value: 475 Ohm
Lead Channel Impedance Value: 537.5 Ohm
Lead Channel Impedance Value: 975 Ohm
Lead Channel Pacing Threshold Amplitude: 1 V
Lead Channel Pacing Threshold Amplitude: 1 V
Lead Channel Pacing Threshold Amplitude: 1 V
Lead Channel Pacing Threshold Amplitude: 1.5 V
Lead Channel Pacing Threshold Pulse Width: 0.5 ms
Lead Channel Pacing Threshold Pulse Width: 0.5 ms
Lead Channel Pacing Threshold Pulse Width: 0.5 ms
Lead Channel Pacing Threshold Pulse Width: 0.5 ms
Lead Channel Sensing Intrinsic Amplitude: 12 mV
Lead Channel Sensing Intrinsic Amplitude: 4.8 mV
Lead Channel Setting Pacing Amplitude: 2 V
Lead Channel Setting Pacing Amplitude: 2 V
Lead Channel Setting Pacing Amplitude: 2.625
Lead Channel Setting Pacing Pulse Width: 0.5 ms
Lead Channel Setting Pacing Pulse Width: 0.5 ms
Lead Channel Setting Sensing Sensitivity: 0.5 mV
Pulse Gen Serial Number: 642812

## 2017-05-29 ENCOUNTER — Ambulatory Visit (HOSPITAL_COMMUNITY): Payer: BLUE CROSS/BLUE SHIELD | Attending: Cardiology

## 2017-05-29 ENCOUNTER — Other Ambulatory Visit: Payer: Self-pay

## 2017-05-29 DIAGNOSIS — I5022 Chronic systolic (congestive) heart failure: Secondary | ICD-10-CM

## 2017-06-01 ENCOUNTER — Ambulatory Visit (INDEPENDENT_AMBULATORY_CARE_PROVIDER_SITE_OTHER): Payer: BLUE CROSS/BLUE SHIELD | Admitting: *Deleted

## 2017-06-01 DIAGNOSIS — Z95810 Presence of automatic (implantable) cardiac defibrillator: Secondary | ICD-10-CM | POA: Diagnosis not present

## 2017-06-01 DIAGNOSIS — I5022 Chronic systolic (congestive) heart failure: Secondary | ICD-10-CM

## 2017-06-01 DIAGNOSIS — I429 Cardiomyopathy, unspecified: Secondary | ICD-10-CM

## 2017-06-01 NOTE — Progress Notes (Signed)
Remote ICD transmission.   

## 2017-06-01 NOTE — Progress Notes (Signed)
EPIC Encounter for ICM Monitoring  Patient Name: Alison Howard is a 56 y.o. female Date: 06/01/2017 Primary Care Physican: Shirline Frees, MD Primary [place] Electrophysiologist: Faustino Congress Weight:does not weigh Bi-V Pacing: 93%   Battery Longevity: 8.3 months      Heart Failure questions reviewed, pt asymptomatic.  Denied any fluid symptoms during time of decreased impedance.   Thoracic impedance normal but was abnormal suggesting fluid accumulation from 05/11/2017 to 05/21/2017.  Prescribed dosage: Furosemide 20 mg 1 tablet twice daily.   Labs: 12/30/2016 Creatinine 0.78, BUN 14, Potassium 4.2, Sodium 140, EGFR 85-98  Recommendations: No changes.   Encouraged to call for fluid symptoms.  Follow-up plan: ICM clinic phone appointment on 07/02/2017.  Office appointment scheduled 08/19/2017 with Chanetta Marshall, NP.  Copy of ICM check sent to Dr. Caryl Comes.   3 month ICM trend: 06/01/2017   1 Year ICM trend:      [name], RN 06/01/2017 3:07 PM

## 2017-06-02 DIAGNOSIS — F325 Major depressive disorder, single episode, in full remission: Secondary | ICD-10-CM | POA: Diagnosis not present

## 2017-06-02 DIAGNOSIS — E038 Other specified hypothyroidism: Secondary | ICD-10-CM | POA: Diagnosis not present

## 2017-06-02 DIAGNOSIS — K219 Gastro-esophageal reflux disease without esophagitis: Secondary | ICD-10-CM | POA: Diagnosis not present

## 2017-06-02 DIAGNOSIS — Z23 Encounter for immunization: Secondary | ICD-10-CM | POA: Diagnosis not present

## 2017-06-03 ENCOUNTER — Encounter: Payer: Self-pay | Admitting: Cardiology

## 2017-06-03 ENCOUNTER — Telehealth: Payer: Self-pay

## 2017-06-03 NOTE — Telephone Encounter (Signed)
Pt is aware and agreeable to echo results 

## 2017-06-16 LAB — CUP PACEART REMOTE DEVICE CHECK
Battery Remaining Longevity: 8 mo
Battery Remaining Percentage: 9 %
Battery Voltage: 2.66 V
Brady Statistic AP VP Percent: 3.2 %
Brady Statistic AP VS Percent: 1 %
Brady Statistic AS VP Percent: 90 %
Brady Statistic AS VS Percent: 3.6 %
Brady Statistic RA Percent Paced: 1 %
Date Time Interrogation Session: 20181105065716
HighPow Impedance: 53 Ohm
Implantable Lead Implant Date: 20120529
Implantable Lead Implant Date: 20120529
Implantable Lead Implant Date: 20120529
Implantable Lead Location: 753858
Implantable Lead Location: 753859
Implantable Lead Location: 753860
Implantable Pulse Generator Implant Date: 20120529
Lead Channel Impedance Value: 450 Ohm
Lead Channel Impedance Value: 510 Ohm
Lead Channel Impedance Value: 950 Ohm
Lead Channel Pacing Threshold Amplitude: 0.875 V
Lead Channel Pacing Threshold Amplitude: 1 V
Lead Channel Pacing Threshold Amplitude: 1.5 V
Lead Channel Pacing Threshold Pulse Width: 0.5 ms
Lead Channel Pacing Threshold Pulse Width: 0.5 ms
Lead Channel Pacing Threshold Pulse Width: 0.5 ms
Lead Channel Sensing Intrinsic Amplitude: 11.8 mV
Lead Channel Sensing Intrinsic Amplitude: 3.5 mV
Lead Channel Setting Pacing Amplitude: 2 V
Lead Channel Setting Pacing Amplitude: 2 V
Lead Channel Setting Pacing Amplitude: 2.5 V
Lead Channel Setting Pacing Pulse Width: 0.5 ms
Lead Channel Setting Pacing Pulse Width: 0.5 ms
Lead Channel Setting Sensing Sensitivity: 0.5 mV
Pulse Gen Serial Number: 642812

## 2017-06-22 ENCOUNTER — Other Ambulatory Visit: Payer: Self-pay

## 2017-06-22 MED ORDER — FLECAINIDE ACETATE 50 MG PO TABS: 50.0000 mg | ORAL_TABLET | Freq: Two times a day (BID) | ORAL | 2 refills | Status: DC

## 2017-07-02 ENCOUNTER — Ambulatory Visit (INDEPENDENT_AMBULATORY_CARE_PROVIDER_SITE_OTHER): Payer: BLUE CROSS/BLUE SHIELD

## 2017-07-02 ENCOUNTER — Telehealth: Payer: Self-pay | Admitting: Cardiology

## 2017-07-02 ENCOUNTER — Telehealth: Payer: Self-pay

## 2017-07-02 DIAGNOSIS — I5022 Chronic systolic (congestive) heart failure: Secondary | ICD-10-CM | POA: Diagnosis not present

## 2017-07-02 DIAGNOSIS — Z95810 Presence of automatic (implantable) cardiac defibrillator: Secondary | ICD-10-CM | POA: Diagnosis not present

## 2017-07-02 NOTE — Progress Notes (Signed)
EPIC Encounter for ICM Monitoring  Patient Name: Alison Howard is a 56 y.o. female Date: 07/02/2017 Primary Care Physican: Shirline Frees, MD Primary [place] Electrophysiologist: Faustino Congress Weight:does not weigh Bi-V Pacing: 89%      Battery Longevity: 8.3 months       Attempted call to patient and unable to reach.  Left message to return call regarding transmission.  Transmission reviewed.    Thoracic impedance is above baseline suggesting dryness.  Impedance was below baseline suggesting fluid accumulation from 11/26 to 11/29.  Prescribed dosage: Furosemide 20 mg 1 tablet twice daily.   Labs: 12/30/2016 Creatinine 0.78, BUN 14, Potassium 4.2, Sodium 140, EGFR 85-98  Recommendations: NONE - Unable to reach.  Follow-up plan: ICM clinic phone appointment on 08/06/2017.  Office appointment scheduled 08/19/2017 with Chanetta Marshall, NP.  Copy of ICM check sent to Dr. Caryl Comes.   3 month ICM trend: 07/02/2017    1 Year ICM trend:       [name], RN 07/02/2017 2:07 PM

## 2017-07-02 NOTE — Telephone Encounter (Signed)
Remote ICM transmission received.  Attempted call to patient and left message to return call regarding transmission. 

## 2017-07-02 NOTE — Telephone Encounter (Signed)
Confirmed remote transmission w/ pt.   

## 2017-08-06 ENCOUNTER — Ambulatory Visit (INDEPENDENT_AMBULATORY_CARE_PROVIDER_SITE_OTHER): Payer: BLUE CROSS/BLUE SHIELD

## 2017-08-06 DIAGNOSIS — I5022 Chronic systolic (congestive) heart failure: Secondary | ICD-10-CM | POA: Diagnosis not present

## 2017-08-06 DIAGNOSIS — Z95810 Presence of automatic (implantable) cardiac defibrillator: Secondary | ICD-10-CM

## 2017-08-07 NOTE — Progress Notes (Signed)
EPIC Encounter for ICM Monitoring  Patient Name: Alison Howard is a 57 y.o. female Date: 08/07/2017 Primary Care Physican: Shirline Frees, MD Primary [place] Electrophysiologist: Faustino Congress Weight:does not weigh Bi-V Pacing: 89% Battery Longevity: 7.1 months       Heart Failure questions reviewed, pt asymptomatic.   Thoracic impedance abnormal suggesting fluid accumulation starting 08/05/2017.  Prescribed dosage: Furosemide 20 mg 1 tablet twice daily.   Labs: 12/30/2016 Creatinine 0.78, BUN 14, Potassium 4.2, Sodium 140, EGFR 85-98  Recommendations: No changes.   Encouraged to call for fluid symptoms.  Follow-up plan: ICM clinic phone appointment on 09/22/2017.  Office appointment scheduled 08/19/2017 with Chanetta Marshall, NP.  Copy of ICM check sent to Dr. Caryl Comes.   3 month ICM trend: 08/06/2017    1 Year ICM trend:       [name], RN 08/07/2017 8:43 AM

## 2017-08-14 NOTE — Progress Notes (Signed)
Electrophysiology Office Note Date: 08/19/2017  ID:  [name], DOB [birth year], MRN [id number]  PCP: Johny Blamer, MD Electrophysiologist: Graciela Husbands  CC: Routine ICD follow-up  Alison Howard is a 57 y.o. female seen today for Dr Graciela Husbands.  She presents today for routine electrophysiology followup.  Since last being seen in our clinic, the patient reports doing reasonably well.  She has recently undergone trigger finger injection.  She denies chest pain, palpitations, dyspnea, PND, orthopnea, nausea, vomiting, dizziness, syncope, edema, weight gain, or early satiety.  She has not had ICD shocks.   Echo 05/2017 demonstrated EF 35-40%, diffuse hypokinesis, grade 1 diastolic dysfunction, LA 42.  Device History: STJ CRTD implanted 2012 for NICM, CHF History of appropriate therapy: Yes History of AAD therapy: Yes - Flecainide   Past Medical History:  Diagnosis Date  . Chronic systolic heart failure (HCC) 04/25/2013  . GERD (gastroesophageal reflux disease)   . Hypercholesteremia   . LBBB (left bundle branch block)   . Morbid obesity (HCC) 12/28/2014  . NICM (nonischemic cardiomyopathy) (HCC)    a. s/p STJ CRTD  . OSA (obstructive sleep apnea) 12/28/2014   Severe with AHI 27 events per hour on average and the highest AHI was 40 events per hour  . Ventricular tachycardia (HCC)    a. s/p appropriate ICD therapy   Past Surgical History:  Procedure Laterality Date  . CARDIAC DEFIBRILLATOR PLACEMENT  2012   a. STJ CRTD implanted for NICM, CHF  . TOTAL ABDOMINAL HYSTERECTOMY      Current Outpatient Medications  Medication Sig Dispense Refill  . bisoprolol (ZEBETA) 5 MG tablet Take 1 tablet (5 mg total) by mouth daily. 90 tablet 3  . citalopram (CELEXA) 20 MG tablet Take 20 mg by mouth daily.    Marland Kitchen eplerenone (INSPRA) 25 MG tablet Take 0.5 tablets (12.5 mg total) by mouth daily. 90 tablet 0  . flecainide (TAMBOCOR) 50 MG tablet Take 1 tablet (50 mg total) by mouth 2 (two) times daily. 180  tablet 2  . furosemide (LASIX) 20 MG tablet Take 1 tablet (20 mg total) by mouth 2 (two) times daily. 60 tablet 10  . irbesartan (AVAPRO) 75 MG tablet Take 1 tablet (75 mg total) by mouth daily. 90 tablet 3  . pantoprazole (PROTONIX) 40 MG tablet Take 40 mg by mouth 2 (two) times daily.      No current facility-administered medications for this visit.     Allergies:   Ciprofloxacin hcl; Codeine; Morphine and related; and Sulfa antibiotics   Social History: Social History   Socioeconomic History  . Marital status: Divorced    Spouse name: Not on file  . Number of children: 0  . Years of education: Not on file  . Highest education level: Not on file  Social Needs  . Financial resource strain: Not on file  . Food insecurity - worry: Not on file  . Food insecurity - inability: Not on file  . Transportation needs - medical: Not on file  . Transportation needs - non-medical: Not on file  Occupational History  . Not on file  Tobacco Use  . Smoking status: Never Smoker  . Smokeless tobacco: Never Used  Substance and Sexual Activity  . Alcohol use: Yes  . Drug use: No  . Sexual activity: Not on file  Other Topics Concern  . Not on file  Social History Narrative  . Not on file    Family History: Family History  Problem Relation Age  of Onset  . Coronary artery disease Mother   . Heart attack Mother   . Hyperlipidemia Sister   . Heart disease Father     Review of Systems: All other systems reviewed and are otherwise negative except as noted above.   Physical Exam: VS:  BP 132/69   Pulse 71   Ht 5' 2.5" (1.588 m)   Wt 237 lb (107.5 kg)   SpO2 94%   BMI 42.66 kg/m  , BMI Body mass index is 42.66 kg/m.  GEN- The patient is obese appearing, alert and oriented x 3 today.   HEENT: normocephalic, atraumatic; sclera clear, conjunctiva pink; hearing intact; oropharynx clear; neck supple Lungs- Clear to ausculation bilaterally, normal work of breathing Heart- Regular rate  and rhythm (paced) GI- soft, non-tender, non-distended, bowel sounds present Extremities- no clubbing, cyanosis, or edema MS- no significant deformity or atrophy Skin- warm and dry, no rash or lesion; ICD pocket well healed Psych- euthymic mood, full affect Neuro- strength and sensation are intact  ICD interrogation- reviewed in detail today,  See PACEART report  EKG:  EKG is not ordered today.  Recent Labs: 12/30/2016: BUN 14; Creatinine, Ser 0.78; Potassium 4.2; Sodium 140   Wt Readings from Last 3 Encounters:  08/19/17 237 lb (107.5 kg)  05/13/17 232 lb 9.6 oz (105.5 kg)  01/23/17 235 lb 3.2 oz (106.7 kg)     Other studies Reviewed: Additional studies/ records that were reviewed today include: Dr Odessa Fleming office notes  Assessment and Plan:  1.  Chronic systolic dysfunction euvolemic today Stable on an appropriate medical regimen Normal ICD function See Pace Art report No changes today Device approaching ERI with 7 months longevity. She is actively remotely monitored. We briefly discussed gen change procedure today.   2.  Ventricular tachycardia No recent recurrence on Flecainide Continue current therapy PVC burden stable   3.  OSA She is compliant with CPAP  4.  Obesity Body mass index is 42.66 kg/m.   Current medicines are reviewed at length with the patient today.   The patient does not have concerns regarding her medicines.  The following changes were made today:  none  Labs/ tests ordered today include: none No orders of the defined types were placed in this encounter.    Disposition:   Follow up with Arsenio Katz, Dr Graciela Husbands 1 year   Signed, Gypsy Balsam, NP 08/19/2017 8:31 AM  [facility] [address] [place] [address] [phone] (office) [phone] (fax)

## 2017-08-16 ENCOUNTER — Encounter: Payer: Self-pay | Admitting: Nurse Practitioner

## 2017-08-18 DIAGNOSIS — M65311 Trigger thumb, right thumb: Secondary | ICD-10-CM | POA: Diagnosis not present

## 2017-08-19 ENCOUNTER — Ambulatory Visit: Payer: BLUE CROSS/BLUE SHIELD | Admitting: Nurse Practitioner

## 2017-08-19 ENCOUNTER — Encounter: Payer: Self-pay | Admitting: Nurse Practitioner

## 2017-08-19 VITALS — BP 132/69 | HR 71 | Ht 62.5 in | Wt 237.0 lb

## 2017-08-19 DIAGNOSIS — I472 Ventricular tachycardia, unspecified: Secondary | ICD-10-CM

## 2017-08-19 DIAGNOSIS — G4733 Obstructive sleep apnea (adult) (pediatric): Secondary | ICD-10-CM | POA: Diagnosis not present

## 2017-08-19 DIAGNOSIS — I5022 Chronic systolic (congestive) heart failure: Secondary | ICD-10-CM | POA: Diagnosis not present

## 2017-08-19 LAB — CUP PACEART INCLINIC DEVICE CHECK
Date Time Interrogation Session: 20190123083500
Implantable Lead Implant Date: 20120529
Implantable Lead Implant Date: 20120529
Implantable Lead Implant Date: 20120529
Implantable Lead Location: 753858
Implantable Lead Location: 753859
Implantable Lead Location: 753860
Implantable Pulse Generator Implant Date: 20120529
Pulse Gen Serial Number: 642812

## 2017-08-19 MED ORDER — BISOPROLOL FUMARATE 5 MG PO TABS: 5.0000 mg | ORAL_TABLET | Freq: Every day | ORAL | 3 refills | Status: DC

## 2017-08-19 NOTE — Patient Instructions (Addendum)
Medication Instructions:    Your physician recommends that you continue on your current medications as directed. Please refer to the Current Medication list given to you today.   If you need a refill on your cardiac medications before your next appointment, please call your pharmacy.  Labwork: NONE ORDERED  TODAY    Testing/Procedures:  NONE ORDERED  TODAY    Follow-Up:  Your physician wants you to follow-up in: ONE YEAR WITH  Graciela Husbands You will receive a reminder letter in the mail two months in advance. If you don't receive a letter, please call our office to schedule the follow-up appointment.   Remote monitoring is used to monitor your Pacemaker of ICD from home. This monitoring reduces the number of office visits required to check your device to one time per year. It allows [place] to keep an eye on the functioning of your device to ensure it is working properly. You are scheduled for a device check from home on . 2-4-19You may send your transmission at any time that day. If you have a wireless device, the transmission will be sent automatically. After your physician reviews your transmission, you will receive a postcard with your next transmission date.     Any Other Special Instructions Will Be Listed Below (If Applicable).

## 2017-08-31 ENCOUNTER — Ambulatory Visit (INDEPENDENT_AMBULATORY_CARE_PROVIDER_SITE_OTHER): Payer: BLUE CROSS/BLUE SHIELD | Admitting: *Deleted

## 2017-08-31 DIAGNOSIS — I429 Cardiomyopathy, unspecified: Secondary | ICD-10-CM | POA: Diagnosis not present

## 2017-08-31 NOTE — Progress Notes (Signed)
Remote ICD transmission.   

## 2017-09-01 ENCOUNTER — Encounter: Payer: Self-pay | Admitting: Cardiology

## 2017-09-18 LAB — CUP PACEART REMOTE DEVICE CHECK
Battery Remaining Longevity: 6 mo
Battery Remaining Percentage: 6 %
Battery Voltage: 2.65 V
Brady Statistic AP VP Percent: 4.7 %
Brady Statistic AP VS Percent: 1 %
Brady Statistic AS VP Percent: 83 %
Brady Statistic AS VS Percent: 7.2 %
Brady Statistic RA Percent Paced: 1 %
Date Time Interrogation Session: 20190204072925
HighPow Impedance: 54 Ohm
Implantable Lead Implant Date: 20120529
Implantable Lead Implant Date: 20120529
Implantable Lead Implant Date: 20120529
Implantable Lead Location: 753858
Implantable Lead Location: 753859
Implantable Lead Location: 753860
Implantable Pulse Generator Implant Date: 20120529
Lead Channel Impedance Value: 480 Ohm
Lead Channel Impedance Value: 540 Ohm
Lead Channel Impedance Value: 910 Ohm
Lead Channel Pacing Threshold Amplitude: 0.5 V
Lead Channel Pacing Threshold Amplitude: 0.875 V
Lead Channel Pacing Threshold Amplitude: 1.625 V
Lead Channel Pacing Threshold Pulse Width: 0.5 ms
Lead Channel Pacing Threshold Pulse Width: 0.5 ms
Lead Channel Pacing Threshold Pulse Width: 0.5 ms
Lead Channel Sensing Intrinsic Amplitude: 12 mV
Lead Channel Sensing Intrinsic Amplitude: 4 mV
Lead Channel Setting Pacing Amplitude: 2 V
Lead Channel Setting Pacing Amplitude: 2 V
Lead Channel Setting Pacing Amplitude: 2.625
Lead Channel Setting Pacing Pulse Width: 0.5 ms
Lead Channel Setting Pacing Pulse Width: 0.5 ms
Lead Channel Setting Sensing Sensitivity: 0.5 mV
Pulse Gen Serial Number: 642812

## 2017-09-22 ENCOUNTER — Ambulatory Visit (INDEPENDENT_AMBULATORY_CARE_PROVIDER_SITE_OTHER): Payer: BLUE CROSS/BLUE SHIELD

## 2017-09-22 DIAGNOSIS — I5022 Chronic systolic (congestive) heart failure: Secondary | ICD-10-CM | POA: Diagnosis not present

## 2017-09-22 DIAGNOSIS — Z95810 Presence of automatic (implantable) cardiac defibrillator: Secondary | ICD-10-CM | POA: Diagnosis not present

## 2017-09-22 NOTE — Progress Notes (Signed)
EPIC Encounter for ICM Monitoring  Patient Name: Alison Howard is a 57 y.o. female Date: 09/22/2017 Primary Care Physican: Shirline Frees, MD Primary [place] Electrophysiologist: Caryl Comes Dry Weight:236 lbs Bi-V Pacing: 88% Battery Longevity: 5.7 months       Heart Failure questions reviewed, pt asymptomatic.   Thoracic impedance normal but was abnormal suggesting fluid accumulation from 09/04/2017 through 09/15/2017.  No symptoms during decreased impedance  Prescribed dosage: Furosemide 20 mg 1 tablet twice daily.   Labs: 12/30/2016 Creatinine 0.78, BUN 14, Potassium 4.2, Sodium 140, EGFR 85-98  Recommendations: No changes.  Encouraged to call for fluid symptoms.  Follow-up plan: ICM clinic phone appointment on 10/23/2017.    Copy of ICM check sent to Dr. Caryl Comes.   3 month ICM trend: 09/22/2017    1 Year ICM trend:       [name], RN 09/22/2017 11:05 AM

## 2017-10-23 ENCOUNTER — Ambulatory Visit (INDEPENDENT_AMBULATORY_CARE_PROVIDER_SITE_OTHER): Payer: BLUE CROSS/BLUE SHIELD

## 2017-10-23 ENCOUNTER — Telehealth: Payer: Self-pay

## 2017-10-23 DIAGNOSIS — Z95810 Presence of automatic (implantable) cardiac defibrillator: Secondary | ICD-10-CM | POA: Diagnosis not present

## 2017-10-23 DIAGNOSIS — I5022 Chronic systolic (congestive) heart failure: Secondary | ICD-10-CM

## 2017-10-23 NOTE — Progress Notes (Signed)
EPIC Encounter for ICM Monitoring  Patient Name: Alison Howard is a 57 y.o. female Date: 10/23/2017 Primary Care Physican: Harris, William, MD Primary Cardiologist:Klein Electrophysiologist: Klein Dry Weight:236 lbs Bi-V Pacing: 87% Battery Longevity:4.4months        Heart Failure questions reviewed, pt feeling sluggish today and thought she may have fluid.    Thoracic impedance abnormal suggesting fluid accumulation starting 10/21/2017.  Prescribed dosage: Furosemide 20 mg 1 tablet twice daily.   Labs: 12/30/2016 Creatinine 0.78, BUN 14, Potassium 4.2, Sodium 140, EGFR 85-98  Recommendations:  Advised to Furosemide 20 mg 2 tablets every AM and 1 tablet every PM x 2 days and then return to prescribed dosage.   Follow-up plan: ICM clinic phone appointment on 11/02/2017 to recheck fluid levels.    Copy of ICM check sent to Dr. Klein.   3 month ICM trend: 10/23/2017    1 Year ICM trend:       Laurie S Short, RN 10/23/2017 12:01 PM   

## 2017-10-23 NOTE — Telephone Encounter (Signed)
Spoke with pt and reminded pt of remote transmission that is due today. Pt verbalized understanding.   

## 2017-11-02 ENCOUNTER — Ambulatory Visit (INDEPENDENT_AMBULATORY_CARE_PROVIDER_SITE_OTHER): Payer: Self-pay

## 2017-11-02 DIAGNOSIS — I5022 Chronic systolic (congestive) heart failure: Secondary | ICD-10-CM

## 2017-11-02 DIAGNOSIS — Z95810 Presence of automatic (implantable) cardiac defibrillator: Secondary | ICD-10-CM

## 2017-11-02 NOTE — Progress Notes (Signed)
EPIC Encounter for ICM Monitoring  Patient Name: Alison Howard is a 57 y.o. female Date: 11/02/2017 Primary Care Physican: Shirline Frees, MD Primary [place] Electrophysiologist: Caryl Comes Dry Weight:225 lbs Bi-V Pacing: 87% Battery Longevity:4.107month        Heart Failure questions reviewed, pt asymptomatic.  Weight decreased after taking extra Furosemide.   Thoracic impedance returned to normal after taking extra Furosemide.   Prescribed dosage: Furosemide 20 mg 1 tablet (20 mg total) two times a day.   Labs: 12/30/2016 Creatinine 0.78, BUN 14, Potassium 4.2, Sodium 140, EGFR 85-98  Recommendations: No changes.  Encouraged to call for fluid symptoms.  Follow-up plan: ICM clinic phone appointment on 11/30/2017.   Copy of ICM check sent to Dr. KCaryl Comes   3 month ICM trend: 11/02/2017    1 Year ICM trend:       LRosalene Billings RN 11/02/2017 9:14 AM

## 2017-11-17 DIAGNOSIS — F325 Major depressive disorder, single episode, in full remission: Secondary | ICD-10-CM | POA: Diagnosis not present

## 2017-11-17 DIAGNOSIS — K219 Gastro-esophageal reflux disease without esophagitis: Secondary | ICD-10-CM | POA: Diagnosis not present

## 2017-11-17 DIAGNOSIS — E038 Other specified hypothyroidism: Secondary | ICD-10-CM | POA: Diagnosis not present

## 2017-11-17 DIAGNOSIS — I5022 Chronic systolic (congestive) heart failure: Secondary | ICD-10-CM | POA: Diagnosis not present

## 2017-11-17 DIAGNOSIS — E7800 Pure hypercholesterolemia, unspecified: Secondary | ICD-10-CM | POA: Diagnosis not present

## 2017-11-22 ENCOUNTER — Other Ambulatory Visit: Payer: Self-pay

## 2017-11-22 ENCOUNTER — Encounter (HOSPITAL_BASED_OUTPATIENT_CLINIC_OR_DEPARTMENT_OTHER): Payer: Self-pay | Admitting: Emergency Medicine

## 2017-11-22 ENCOUNTER — Emergency Department (HOSPITAL_BASED_OUTPATIENT_CLINIC_OR_DEPARTMENT_OTHER): Payer: BLUE CROSS/BLUE SHIELD

## 2017-11-22 ENCOUNTER — Emergency Department (HOSPITAL_BASED_OUTPATIENT_CLINIC_OR_DEPARTMENT_OTHER)
Admission: EM | Admit: 2017-11-22 | Discharge: 2017-11-22 | Disposition: A | Discharge status: Home / Self Care | Payer: BLUE CROSS/BLUE SHIELD | Attending: Emergency Medicine | Admitting: Emergency Medicine

## 2017-11-22 DIAGNOSIS — S0003XA Contusion of scalp, initial encounter: Secondary | ICD-10-CM | POA: Diagnosis not present

## 2017-11-22 DIAGNOSIS — Z79899 Other long term (current) drug therapy: Secondary | ICD-10-CM | POA: Diagnosis not present

## 2017-11-22 DIAGNOSIS — Y999 Unspecified external cause status: Secondary | ICD-10-CM | POA: Diagnosis not present

## 2017-11-22 DIAGNOSIS — Y9389 Activity, other specified: Secondary | ICD-10-CM | POA: Insufficient documentation

## 2017-11-22 DIAGNOSIS — S098XXA Other specified injuries of head, initial encounter: Secondary | ICD-10-CM | POA: Diagnosis not present

## 2017-11-22 DIAGNOSIS — S0990XA Unspecified injury of head, initial encounter: Secondary | ICD-10-CM | POA: Diagnosis not present

## 2017-11-22 DIAGNOSIS — Y929 Unspecified place or not applicable: Secondary | ICD-10-CM | POA: Diagnosis not present

## 2017-11-22 DIAGNOSIS — S0993XA Unspecified injury of face, initial encounter: Secondary | ICD-10-CM | POA: Diagnosis not present

## 2017-11-22 DIAGNOSIS — S300XXA Contusion of lower back and pelvis, initial encounter: Secondary | ICD-10-CM | POA: Diagnosis not present

## 2017-11-22 DIAGNOSIS — S0093XA Contusion of unspecified part of head, initial encounter: Secondary | ICD-10-CM | POA: Diagnosis not present

## 2017-11-22 DIAGNOSIS — S0083XA Contusion of other part of head, initial encounter: Secondary | ICD-10-CM | POA: Insufficient documentation

## 2017-11-22 DIAGNOSIS — I5022 Chronic systolic (congestive) heart failure: Secondary | ICD-10-CM | POA: Diagnosis not present

## 2017-11-22 DIAGNOSIS — R51 Headache: Secondary | ICD-10-CM | POA: Diagnosis not present
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
## 2017-11-30 ENCOUNTER — Ambulatory Visit (INDEPENDENT_AMBULATORY_CARE_PROVIDER_SITE_OTHER): Payer: BLUE CROSS/BLUE SHIELD | Admitting: *Deleted

## 2017-11-30 DIAGNOSIS — I5022 Chronic systolic (congestive) heart failure: Secondary | ICD-10-CM | POA: Diagnosis not present

## 2017-11-30 DIAGNOSIS — Z95810 Presence of automatic (implantable) cardiac defibrillator: Secondary | ICD-10-CM

## 2017-11-30 DIAGNOSIS — I429 Cardiomyopathy, unspecified: Secondary | ICD-10-CM

## 2017-11-30 NOTE — Progress Notes (Signed)
EPIC Encounter for ICM Monitoring  Patient Name: Alison Howard is a 56 y.o. female Date: 11/30/2017 Primary Care Physican: Shirline Frees, MD Primary [place] Electrophysiologist: Caryl Comes Dry Weight:220 lbs Bi-V Pacing: 88% Battery Longevity: <46month      Heart Failure questions reviewed, pt asymptomatic.  Patient in process of divorcing   Thoracic impedance normal but was abnormal suggesting fluid accumulation from 11/08/2017 - 11/18/2017.  Prescribed dosage: Furosemide 20 mg 1 tablet (20 mg total) two times a day.   Labs: 12/30/2016 Creatinine 0.78, BUN 14, Potassium 4.2, Sodium 140, EGFR 85-98  Recommendations: No changes.  Advised that she may be a little dehydrated since 4/24 and to drink around 64 oz daily to stay hydrated. Encouraged to call for fluid symptoms.  Follow-up plan: ICM clinic phone appointment on 12/31/2017.    Copy of ICM check sent to Dr. KCaryl Comes   3 month ICM trend: 11/30/2017    1 Year ICM trend:       LRosalene Billings RN 11/30/2017 1:35 PM

## 2017-12-01 NOTE — Progress Notes (Signed)
Remote ICD transmission.   

## 2017-12-02 ENCOUNTER — Encounter: Payer: Self-pay | Admitting: Cardiology

## 2017-12-14 LAB — CUP PACEART REMOTE DEVICE CHECK
Battery Remaining Longevity: 3 mo
Battery Remaining Percentage: 3 %
Battery Voltage: 2.6 V
Brady Statistic AP VP Percent: 4.2 %
Brady Statistic AP VS Percent: 1 %
Brady Statistic AS VP Percent: 84 %
Brady Statistic AS VS Percent: 6.7 %
Brady Statistic RA Percent Paced: 1 %
Date Time Interrogation Session: 20190506060133
HighPow Impedance: 53 Ohm
Implantable Lead Implant Date: 20120529
Implantable Lead Implant Date: 20120529
Implantable Lead Implant Date: 20120529
Implantable Lead Location: 753858
Implantable Lead Location: 753859
Implantable Lead Location: 753860
Implantable Pulse Generator Implant Date: 20120529
Lead Channel Impedance Value: 440 Ohm
Lead Channel Impedance Value: 550 Ohm
Lead Channel Impedance Value: 930 Ohm
Lead Channel Pacing Threshold Amplitude: 0.5 V
Lead Channel Pacing Threshold Amplitude: 0.875 V
Lead Channel Pacing Threshold Amplitude: 1.875 V
Lead Channel Pacing Threshold Pulse Width: 0.5 ms
Lead Channel Pacing Threshold Pulse Width: 0.5 ms
Lead Channel Pacing Threshold Pulse Width: 0.5 ms
Lead Channel Sensing Intrinsic Amplitude: 12 mV
Lead Channel Sensing Intrinsic Amplitude: 3.1 mV
Lead Channel Setting Pacing Amplitude: 2 V
Lead Channel Setting Pacing Amplitude: 2 V
Lead Channel Setting Pacing Amplitude: 2.875
Lead Channel Setting Pacing Pulse Width: 0.5 ms
Lead Channel Setting Pacing Pulse Width: 0.5 ms
Lead Channel Setting Sensing Sensitivity: 0.5 mV
Pulse Gen Serial Number: 642812

## 2017-12-28 DIAGNOSIS — M65311 Trigger thumb, right thumb: Secondary | ICD-10-CM | POA: Diagnosis not present

## 2017-12-31 ENCOUNTER — Ambulatory Visit (INDEPENDENT_AMBULATORY_CARE_PROVIDER_SITE_OTHER): Payer: BLUE CROSS/BLUE SHIELD

## 2017-12-31 DIAGNOSIS — I5022 Chronic systolic (congestive) heart failure: Secondary | ICD-10-CM | POA: Diagnosis not present

## 2017-12-31 DIAGNOSIS — Z95810 Presence of automatic (implantable) cardiac defibrillator: Secondary | ICD-10-CM

## 2017-12-31 NOTE — Progress Notes (Signed)
EPIC Encounter for ICM Monitoring  Patient Name: Alison Howard is a 57 y.o. female Date: 12/31/2017 Primary Care Physican: Alison Frees, MD Primary [place] Electrophysiologist: Alison Howard Dry Weight:220lbs Bi-V Pacing: 89% Battery Longevity: <59month       Heart Failure questions reviewed, pt asymptomatic.  Discussed battery longevity estimate and there is still ~3 months left on the battery after the alert to replace.   Thoracic impedance close to baseline normal but was abnormal suggesting fluid accumulation from 12/18/2017 - 12/25/2017.  Prescribed dosage: Furosemide 20 mg 1 tablet(20 mg total) two times a day.   Labs: 12/30/2016 Creatinine 0.78, BUN 14, Potassium 4.2, Sodium 140, EGFR 85-98  Recommendations: No changes.   Encouraged to call for fluid symptoms.  Follow-up plan: ICM clinic phone appointment on 02/01/2018.   Copy of ICM check sent to Dr. KCaryl Howard   3 month ICM trend: 12/31/2017    1 Year ICM trend:       Alison Billings RN 12/31/2017 12:20 PM

## 2018-01-02 ENCOUNTER — Other Ambulatory Visit: Payer: Self-pay | Admitting: Nurse Practitioner

## 2018-02-01 ENCOUNTER — Encounter: Payer: Self-pay | Admitting: Internal Medicine

## 2018-02-01 ENCOUNTER — Ambulatory Visit: Payer: BLUE CROSS/BLUE SHIELD | Admitting: Internal Medicine

## 2018-02-01 ENCOUNTER — Ambulatory Visit (INDEPENDENT_AMBULATORY_CARE_PROVIDER_SITE_OTHER): Payer: BLUE CROSS/BLUE SHIELD

## 2018-02-01 VITALS — BP 130/72 | HR 74 | Ht 62.0 in | Wt 223.6 lb

## 2018-02-01 DIAGNOSIS — Z95810 Presence of automatic (implantable) cardiac defibrillator: Secondary | ICD-10-CM

## 2018-02-01 DIAGNOSIS — I5022 Chronic systolic (congestive) heart failure: Secondary | ICD-10-CM

## 2018-02-01 DIAGNOSIS — I493 Ventricular premature depolarization: Secondary | ICD-10-CM

## 2018-02-01 DIAGNOSIS — I472 Ventricular tachycardia, unspecified: Secondary | ICD-10-CM

## 2018-02-01 DIAGNOSIS — I429 Cardiomyopathy, unspecified: Secondary | ICD-10-CM | POA: Diagnosis not present

## 2018-02-01 DIAGNOSIS — I428 Other cardiomyopathies: Secondary | ICD-10-CM

## 2018-02-01 MED ORDER — SACUBITRIL-VALSARTAN 24-26 MG PO TABS: 1.0000 | ORAL_TABLET | Freq: Two times a day (BID) | ORAL | 0 refills | Status: DC

## 2018-02-01 NOTE — Patient Instructions (Addendum)
Medication Instructions:  Your physician has recommended you make the following change in your medication:   1. Stop Ibersartan 2. Begin Entresto 24/26, one pill, two times per day.   Labwork: You will have labs drawn today: CBC, BMP  Testing/Procedures: Your physician has recommended that you have a ICD battery change out. An ICD is a small device that is placed under the skin of your chest or abdomen to help control abnormal heart rhythms. This device uses electrical pulses to prompt the heart to beat at a normal rate. ICD's are used to treat heart rhythms that are too slow. Wire (leads) are attached to the ICD that goes into the chambers of you heart. This is done in the hospital and usually requires and overnight stay. Please see the instruction sheet given to you today for more information.   Follow-Up: Your physician recommends that you schedule a follow-up appointment in:   10-14 days after July 17th for a wound check with our device clinic. Please have a repeat BMP drawn on this day as well.  91 days after July 17th with Dr Graciela Husbands.  Any Other Special Instructions Will Be Listed Below (If Applicable).     If you need a refill on your cardiac medications before your next appointment, please call your pharmacy.

## 2018-02-01 NOTE — Progress Notes (Signed)
    Patient Care Team: Harris, William, MD as PCP - General (Family Medicine)   HPI  Alison Howard is a 57 y.o. female Seen in followup for CRT-D implanted at [facility] for cardiomyopathy presumed nonischemic of >5 yrs duration   She was seen in 6/15 following appropriate therapies for VT-ATP      DATE TEST EF   8/13  Echo  40-45%   12/15    Echo   25-30 %   6/17    Echo   35%   6/18 Echo   40-45%   11/18 Echo  35-40%    She has mild chronic sob and no edema; no chest pain     DATE PVC %   8/17 11 Multiple morphologies  6/18 22  Multiple morphologies  10/18 5.2 From device   7/19 >10%    Date Cr K Mg  5/17  0.8 4.1 2.1   6/18  0.78 4.2     She is tolerating flecainide   Past Medical History:  Diagnosis Date  . Chronic systolic heart failure (HCC) 04/25/2013  . GERD (gastroesophageal reflux disease)   . Hypercholesteremia   . LBBB (left bundle branch block)   . Morbid obesity (HCC) 12/28/2014  . NICM (nonischemic cardiomyopathy) (HCC)    a. s/p STJ CRTD  . OSA (obstructive sleep apnea) 12/28/2014   Severe with AHI 27 events per hour on average and the highest AHI was 40 events per hour  . Ventricular tachycardia (HCC)    a. s/p appropriate ICD therapy    Past Surgical History:  Procedure Laterality Date  . CARDIAC DEFIBRILLATOR PLACEMENT  2012   a. STJ CRTD implanted for NICM, CHF  . TOTAL ABDOMINAL HYSTERECTOMY      Current Outpatient Medications  Medication Sig Dispense Refill  . bisoprolol (ZEBETA) 5 MG tablet Take 1 tablet (5 mg total) by mouth daily. 90 tablet 3  . citalopram (CELEXA) 20 MG tablet Take 20 mg by mouth daily.    . eplerenone (INSPRA) 25 MG tablet Take 0.5 tablets (12.5 mg total) by mouth daily. 90 tablet 0  . flecainide (TAMBOCOR) 50 MG tablet Take 1 tablet (50 mg total) by mouth 2 (two) times daily. 180 tablet 2  . furosemide (LASIX) 20 MG tablet Take 1 tablet (20 mg total) by mouth 2 (two) times daily. 60 tablet 10  . irbesartan  (AVAPRO) 75 MG tablet Take 1 tablet (75 mg total) by mouth daily. 90 tablet 3  . pantoprazole (PROTONIX) 40 MG tablet Take 40 mg by mouth 2 (two) times daily.      No current facility-administered medications for this visit.     Allergies  Allergen Reactions  . Ciprofloxacin Hcl Anaphylaxis  . Codeine Anaphylaxis and Nausea And Vomiting  . Morphine And Related Anaphylaxis  . Sulfa Antibiotics Anaphylaxis    Review of Systems negative except from HPI and PMH  Physical Exam BP 130/72   Pulse 74   Ht 5' 2" (1.575 m)   Wt 223 lb 9.6 oz (101.4 kg)   SpO2 91%   BMI 40.90 kg/m  Well developed and nourished in no acute distress HENT normal Neck supple with JVP-flat Clear Regular rate and rhythm, no murmurs or gallops Abd-soft with active BS No Clubbing cyanosis edema Skin-warm and dry A & Oriented  Grossly normal sensory and motor function   ECG demonstrates   Assessment and  Plan  Nonischemic cardiomyopathy  Congestive heart failure- chronic-systolic  PVCs    Morbidly obese    She continues with freq PVC of predominantly  LBBB infer axis morphology with transition V2  They have persisted despite flecainide  She has not been inclined towards ablation  We will stop irbesartan and begin entresto  There is some antiarrhythmic potential here and will reassess about ablation later  Her device has reached ERI  We have reviewed the benefits and risks of generator replacement.  These include but are not limited to lead fracture and infection.  The patient understands, agrees and is willing to proceed.       

## 2018-02-01 NOTE — H&P (View-Only) (Signed)
Patient Care Team: Johny Blamer, MD as PCP - General (Family Medicine)   HPI  Alison Howard is a 57 y.o. female Seen in followup for CRT-D implanted at [facility] for cardiomyopathy presumed nonischemic of >5 yrs duration   She was seen in 6/15 following appropriate therapies for VT-ATP      DATE TEST EF   8/13  Echo  40-45%   12/15    Echo   25-30 %   6/17    Echo   35%   6/18 Echo   40-45%   11/18 Echo  35-40%    She has mild chronic sob and no edema; no chest pain     DATE PVC %   8/17 11 Multiple morphologies  6/18 22  Multiple morphologies  10/18 5.2 From device   7/19 >10%    Date Cr K Mg  5/17  0.8 4.1 2.1   6/18  0.78 4.2     She is tolerating flecainide   Past Medical History:  Diagnosis Date  . Chronic systolic heart failure (HCC) 04/25/2013  . GERD (gastroesophageal reflux disease)   . Hypercholesteremia   . LBBB (left bundle branch block)   . Morbid obesity (HCC) 12/28/2014  . NICM (nonischemic cardiomyopathy) (HCC)    a. s/p STJ CRTD  . OSA (obstructive sleep apnea) 12/28/2014   Severe with AHI 27 events per hour on average and the highest AHI was 40 events per hour  . Ventricular tachycardia (HCC)    a. s/p appropriate ICD therapy    Past Surgical History:  Procedure Laterality Date  . CARDIAC DEFIBRILLATOR PLACEMENT  2012   a. STJ CRTD implanted for NICM, CHF  . TOTAL ABDOMINAL HYSTERECTOMY      Current Outpatient Medications  Medication Sig Dispense Refill  . bisoprolol (ZEBETA) 5 MG tablet Take 1 tablet (5 mg total) by mouth daily. 90 tablet 3  . citalopram (CELEXA) 20 MG tablet Take 20 mg by mouth daily.    Marland Kitchen eplerenone (INSPRA) 25 MG tablet Take 0.5 tablets (12.5 mg total) by mouth daily. 90 tablet 0  . flecainide (TAMBOCOR) 50 MG tablet Take 1 tablet (50 mg total) by mouth 2 (two) times daily. 180 tablet 2  . furosemide (LASIX) 20 MG tablet Take 1 tablet (20 mg total) by mouth 2 (two) times daily. 60 tablet 10  . irbesartan  (AVAPRO) 75 MG tablet Take 1 tablet (75 mg total) by mouth daily. 90 tablet 3  . pantoprazole (PROTONIX) 40 MG tablet Take 40 mg by mouth 2 (two) times daily.      No current facility-administered medications for this visit.     Allergies  Allergen Reactions  . Ciprofloxacin Hcl Anaphylaxis  . Codeine Anaphylaxis and Nausea And Vomiting  . Morphine And Related Anaphylaxis  . Sulfa Antibiotics Anaphylaxis    Review of Systems negative except from HPI and PMH  Physical Exam BP 130/72   Pulse 74   Ht 5\' 2"  (1.575 m)   Wt 223 lb 9.6 oz (101.4 kg)   SpO2 91%   BMI 40.90 kg/m  Well developed and nourished in no acute distress HENT normal Neck supple with JVP-flat Clear Regular rate and rhythm, no murmurs or gallops Abd-soft with active BS No Clubbing cyanosis edema Skin-warm and dry A & Oriented  Grossly normal sensory and motor function   ECG demonstrates   Assessment and  Plan  Nonischemic cardiomyopathy  Congestive heart failure- chronic-systolic  PVCs  Morbidly obese    She continues with freq PVC of predominantly  LBBB infer axis morphology with transition V2  They have persisted despite flecainide  She has not been inclined towards ablation  We will stop irbesartan and begin entresto  There is some antiarrhythmic potential here and will reassess about ablation later  Her device has reached ERI  We have reviewed the benefits and risks of generator replacement.  These include but are not limited to lead fracture and infection.  The patient understands, agrees and is willing to proceed.

## 2018-02-02 ENCOUNTER — Telehealth: Payer: Self-pay

## 2018-02-02 LAB — CBC WITH DIFFERENTIAL/PLATELET
Basophils Absolute: 0.1 10*3/uL (ref 0.0–0.2)
Basos: 1 %
EOS (ABSOLUTE): 0.2 10*3/uL (ref 0.0–0.4)
Eos: 2 %
Hematocrit: 42.8 % (ref 34.0–46.6)
Hemoglobin: 14.4 g/dL (ref 11.1–15.9)
Immature Grans (Abs): 0 10*3/uL (ref 0.0–0.1)
Immature Granulocytes: 0 %
Lymphocytes Absolute: 3.8 10*3/uL — ABNORMAL HIGH (ref 0.7–3.1)
Lymphs: 35 %
MCH: 30.8 pg (ref 26.6–33.0)
MCHC: 33.6 g/dL (ref 31.5–35.7)
MCV: 92 fL (ref 79–97)
Monocytes Absolute: 1 10*3/uL — ABNORMAL HIGH (ref 0.1–0.9)
Monocytes: 9 %
Neutrophils Absolute: 5.9 10*3/uL (ref 1.4–7.0)
Neutrophils: 53 %
Platelets: 423 10*3/uL (ref 150–450)
RBC: 4.68 x10E6/uL (ref 3.77–5.28)
RDW: 13.3 % (ref 12.3–15.4)
WBC: 11 10*3/uL — ABNORMAL HIGH (ref 3.4–10.8)

## 2018-02-02 LAB — BASIC METABOLIC PANEL
BUN/Creatinine Ratio: 14 (ref 9–23)
BUN: 13 mg/dL (ref 6–24)
CO2: 26 mmol/L (ref 20–29)
Calcium: 9.7 mg/dL (ref 8.7–10.2)
Chloride: 99 mmol/L (ref 96–106)
Creatinine, Ser: 0.94 mg/dL (ref 0.57–1.00)
GFR calc Af Amer: 78 mL/min/{1.73_m2} (ref >59)
GFR calc non Af Amer: 68 mL/min/{1.73_m2} (ref >59)
Glucose: 94 mg/dL (ref 65–99)
Potassium: 4.2 mmol/L (ref 3.5–5.2)
Sodium: 142 mmol/L (ref 134–144)

## 2018-02-02 NOTE — Progress Notes (Signed)
Patient returned call.  She reported feeling fine but does have a high stress level at this time.  No symptoms during decreased impedance in June.  Advised to call if she develops fluid symptoms.  Next ICM remote transmission 04/05/2018 due to replacement battery is scheduled for 02/10/2018.

## 2018-02-02 NOTE — Progress Notes (Signed)
EPIC Encounter for ICM Monitoring  Patient Name: Alison Howard is a 57 y.o. female Date: 02/02/2018 Primary Care Physican: Shirline Frees, MD Primary [place] Electrophysiologist: Caryl Comes Dry Weight:Previous weight 220lbs Bi-V Pacing: 90% ERI reached 01/15/2018.       Attempted call to patient and unable to reach.  Left message to return call.  Transmission reviewed.  Scheduled for battery replacement 02/10/2018   Thoracic impedance normal.  Prescribed dosage: Furosemide 20 mg 1 tablet(20 mg total) two times a day.   Labs: 02/01/2018 Creatinine 0.94, BUN 13, Potassium 4.2, Sodium 142, EGFR 68-78 12/30/2016 Creatinine 0.78, BUN 14, Potassium 4.2, Sodium 140, EGFR 85-98  Recommendations: NONE - Unable to reach.  Follow-up plan: ICM clinic phone appointment on 04/05/2018.     Copy of ICM check sent to Dr. Caryl Comes.   3 month ICM trend: 02/01/2018    1 Year ICM trend:       [name], RN 02/02/2018 10:04 AM

## 2018-02-02 NOTE — Telephone Encounter (Signed)
Remote ICM transmission received.  Attempted call to patient and left message to return call. 

## 2018-02-03 ENCOUNTER — Telehealth: Payer: Self-pay | Admitting: Internal Medicine

## 2018-02-03 NOTE — Telephone Encounter (Signed)
Pt called to reschedule her Device change to July 22. Follow up appts have been adjusted accordingly.

## 2018-02-03 NOTE — Telephone Encounter (Signed)
Pt calling and need to speak to nurse to change her surgery date. Please call

## 2018-02-11 ENCOUNTER — Telehealth: Payer: Self-pay | Admitting: Internal Medicine

## 2018-02-11 NOTE — Telephone Encounter (Signed)
New Message       Patient has questions concerning her surgery, she wants to know how long she will be out of commission and how long will it be before she can drive?

## 2018-02-11 NOTE — Telephone Encounter (Signed)
Pt calling to inquire when she may return to work and drive. I advised her she will have driving restrictions for at least 24 hrs after her procedure. 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 I told her she might want to take a couple of days off after her change out but her activity would be to what she tolerates with her pain. Pt verbalized understanding and had no additional questions.

## 2018-02-17 ENCOUNTER — Ambulatory Visit (HOSPITAL_COMMUNITY)
Admission: RE | Disposition: A | Discharge status: Home / Self Care | Payer: Self-pay | Source: Ambulatory Visit | Attending: Internal Medicine

## 2018-02-17 ENCOUNTER — Encounter (HOSPITAL_COMMUNITY): Payer: Self-pay | Admitting: Internal Medicine

## 2018-02-17 ENCOUNTER — Ambulatory Visit (HOSPITAL_COMMUNITY)
Admission: RE | Admit: 2018-02-17 | Discharge: 2018-02-17 | Disposition: A | Discharge status: Home / Self Care | Payer: BLUE CROSS/BLUE SHIELD | Source: Ambulatory Visit | Attending: Internal Medicine | Admitting: Internal Medicine

## 2018-02-17 ENCOUNTER — Other Ambulatory Visit: Payer: Self-pay

## 2018-02-17 DIAGNOSIS — I5022 Chronic systolic (congestive) heart failure: Secondary | ICD-10-CM | POA: Diagnosis not present

## 2018-02-17 DIAGNOSIS — Z882 Allergy status to sulfonamides status: Secondary | ICD-10-CM | POA: Diagnosis not present

## 2018-02-17 DIAGNOSIS — Z4502 Encounter for adjustment and management of automatic implantable cardiac defibrillator: Secondary | ICD-10-CM | POA: Diagnosis not present

## 2018-02-17 DIAGNOSIS — I428 Other cardiomyopathies: Secondary | ICD-10-CM | POA: Insufficient documentation

## 2018-02-17 DIAGNOSIS — E7800 Pure hypercholesterolemia, unspecified: Secondary | ICD-10-CM | POA: Insufficient documentation

## 2018-02-17 DIAGNOSIS — K219 Gastro-esophageal reflux disease without esophagitis: Secondary | ICD-10-CM | POA: Insufficient documentation

## 2018-02-17 DIAGNOSIS — Z885 Allergy status to narcotic agent status: Secondary | ICD-10-CM | POA: Insufficient documentation

## 2018-02-17 DIAGNOSIS — G4733 Obstructive sleep apnea (adult) (pediatric): Secondary | ICD-10-CM | POA: Diagnosis not present

## 2018-02-17 DIAGNOSIS — Z95810 Presence of automatic (implantable) cardiac defibrillator: Secondary | ICD-10-CM | POA: Diagnosis present

## 2018-02-17 DIAGNOSIS — I447 Left bundle-branch block, unspecified: Secondary | ICD-10-CM | POA: Insufficient documentation

## 2018-02-17 DIAGNOSIS — Z6841 Body Mass Index (BMI) 40.0 and over, adult: Secondary | ICD-10-CM | POA: Insufficient documentation

## 2018-02-17 HISTORY — PX: BIV ICD GENERATOR CHANGEOUT: EP1194

## 2018-02-17 SURGERY — BIV ICD GENERATOR CHANGEOUT

## 2018-02-17 MED ORDER — MIDAZOLAM HCL 5 MG/5ML IJ SOLN
INTRAMUSCULAR | Status: DC | PRN
  Administered 2018-02-17 (×2): 1 mg via INTRAVENOUS
  Administered 2018-02-17: 2 mg via INTRAVENOUS

## 2018-02-17 MED ORDER — LIDOCAINE HCL (PF) 1 % IJ SOLN
INTRAMUSCULAR | Status: DC | PRN
  Administered 2018-02-17: 50 mL

## 2018-02-17 MED ORDER — CEFAZOLIN SODIUM-DEXTROSE 2-4 GM/100ML-% IV SOLN
2.0000 g | INTRAVENOUS | Status: AC
  Administered 2018-02-17: 2 g via INTRAVENOUS

## 2018-02-17 MED ORDER — SODIUM CHLORIDE 0.9 % IV SOLN: INTRAVENOUS | Status: AC

## 2018-02-17 MED ORDER — LIDOCAINE HCL (PF) 1 % IJ SOLN
INTRAMUSCULAR | Status: AC
  Filled 2018-02-17: qty 30

## 2018-02-17 MED ORDER — SODIUM CHLORIDE 0.9 % IV SOLN
80.0000 mg | INTRAVENOUS | Status: AC
  Administered 2018-02-17: 80 mg

## 2018-02-17 MED ORDER — MIDAZOLAM HCL 5 MG/5ML IJ SOLN
INTRAMUSCULAR | Status: AC
  Filled 2018-02-17: qty 5

## 2018-02-17 MED ORDER — ACETAMINOPHEN 325 MG PO TABS
325.0000 mg | ORAL_TABLET | ORAL | Status: DC | PRN
  Filled 2018-02-17: qty 2

## 2018-02-17 MED ORDER — SODIUM CHLORIDE 0.9 % IV SOLN
INTRAVENOUS | Status: DC
  Administered 2018-02-17: 09:00:00 via INTRAVENOUS

## 2018-02-17 MED ORDER — FENTANYL CITRATE (PF) 100 MCG/2ML IJ SOLN
INTRAMUSCULAR | Status: AC
  Filled 2018-02-17: qty 2

## 2018-02-17 MED ORDER — MUPIROCIN 2 % EX OINT
1.0000 "application " | TOPICAL_OINTMENT | Freq: Once | CUTANEOUS | Status: AC
  Administered 2018-02-17: 1 via TOPICAL

## 2018-02-17 MED ORDER — MUPIROCIN 2 % EX OINT
TOPICAL_OINTMENT | CUTANEOUS | Status: AC
  Administered 2018-02-17: 1 via TOPICAL
  Filled 2018-02-17: qty 22

## 2018-02-17 MED ORDER — ONDANSETRON HCL 4 MG/2ML IJ SOLN
4.0000 mg | Freq: Four times a day (QID) | INTRAMUSCULAR | Status: DC | PRN
Start: 2018-02-17 — End: 2018-02-17

## 2018-02-17 MED ORDER — CEFAZOLIN SODIUM-DEXTROSE 2-4 GM/100ML-% IV SOLN
INTRAVENOUS | Status: AC
  Filled 2018-02-17: qty 100

## 2018-02-17 MED ORDER — LIDOCAINE HCL (PF) 1 % IJ SOLN
INTRAMUSCULAR | Status: AC
  Filled 2018-02-17: qty 60

## 2018-02-17 MED ORDER — SODIUM CHLORIDE 0.9 % IV SOLN
INTRAVENOUS | Status: AC
  Filled 2018-02-17: qty 2

## 2018-02-17 SURGICAL SUPPLY — 6 items
ASSURA CRTD CD3369-40Q (ICD Generator) ×2 IMPLANT
CABLE SURGICAL S-101-97-12 (CABLE) ×2 IMPLANT
DEFIB ASSURA MULTI-CHMBR CRT-D (ICD Generator) ×1 IMPLANT
HEMOSTAT SURGICEL 2X4 FIBR (HEMOSTASIS) ×2 IMPLANT
PAD DEFIB LIFELINK (PAD) ×2 IMPLANT
TRAY PACEMAKER INSERTION (PACKS) ×2 IMPLANT

## 2018-02-17 NOTE — Interval H&P Note (Signed)
ICD Criteria  Current LVEF:35%. Within 12 months prior to implant: Yes   Heart failure history: Yes, Class II  Cardiomyopathy history: Yes, Non-Ischemic Cardiomyopathy.  Atrial Fibrillation/Atrial Flutter: No.  Ventricular tachycardia history: Yes, Hemodynamic instability present. VT Type: Sustained Ventricular Tachycardia - Monomorphic.  Cardiac arrest history: No.  History of syndromes with risk of sudden death: No.  Previous ICD: Yes, Reason for ICD:  Primary prevention.  Current ICD indication: Secondary  PPM indication: No.   Class I or II Bradycardia indication present: No  Beta Blocker therapy for 3 or more months: Yes, prescribed.   Ace Inhibitor/ARB therapy for 3 or more months: Yes, prescribed.   History and Physical Interval Note:  02/17/2018 9:27 AM  Alison Howard  has presented today for surgery, with the diagnosis of eri  The various methods of treatment have been discussed with the patient and family. After consideration of risks, benefits and other options for treatment, the patient has consented to  Procedure(s): ICD GENERATOR CHANGEOUT (N/A) as a surgical intervention .  The patient's history has been reviewed, patient examined, no change in status, stable for surgery.  I have reviewed the patient's chart and labs.  Questions were answered to the patient's satisfaction.     Sherryl Manges

## 2018-02-17 NOTE — Discharge Instructions (Signed)
**Note Alison Howard-identified via Obfuscation** Post procedure care instructions °Keep incision clean and dry for 10 days. °No driving for 2 days.  °You can remove outer dressing tomorrow. °Leave steri-strips (little pieces of tape) on until seen in the office for wound check appointment. °Call the office ([phone]) for redness, drainage, swelling, or fever. ° ° °BIV ICD Generator Change, Care After °This sheet gives you information about how to care for yourself after your procedure. Your health care provider may also give you more specific instructions. If you have problems or questions, contact your health care provider. °What can I expect after the procedure? °After your procedure, it is common to have: °· Pain or soreness at the site where the pacemaker was inserted. °· Swelling at the site where the pacemaker was inserted. ° °Follow these instructions at home: °Incision care °· Keep the incision clean and dry. °? Do not take baths, swim, or use a hot tub until your health care provider approves. °? You may shower the day after your procedure, or as directed by your health care provider. °? Pat the area dry with a clean towel. Do not rub the area. This may cause bleeding. °· Follow instructions from your health care provider about how to take care of your incision. Make sure you: °? Wash your hands with soap and water before you change your bandage (dressing). If soap and water are not available, use hand sanitizer. °? Change your dressing as told by your health care provider. °? Leave stitches (sutures), skin glue, or adhesive strips in place. These skin closures may need to stay in place for 2 weeks or longer. If adhesive strip edges start to loosen and curl up, you may trim the loose edges. Do not remove adhesive strips completely unless your health care provider tells you to do that. °· Check your incision area every day for signs of infection. Check for: °? More redness, swelling, or pain. °? More fluid or blood. °? Warmth. °? Pus or a bad  smell. °Activity °· Do not lift anything that is heavier than 10 lb (4.5 kg) until your health care provider says it is okay to do so. °· For the first 2 weeks, or as long as told by your health care provider: °? Avoid lifting your left arm higher than your shoulder. °? Be gentle when you move your arms over your head. It is okay to raise your arm to comb your hair. °? Avoid strenuous exercise. °· Ask your health care provider when it is okay to: °? Resume your normal activities. °? Return to work or school. °? Resume sexual activity. °Eating and drinking °· Eat a heart-healthy diet. This should include plenty of fresh fruits and vegetables, whole grains, low-fat dairy products, and lean protein like chicken and fish. °· Limit alcohol intake to no more than 1 drink a day for non-pregnant women and 2 drinks a day for men. One drink equals 12 oz of beer, 5 oz of wine, or 1½ oz of hard liquor. °· Check ingredients and nutrition facts on packaged foods and beverages. Avoid the following types of food: °? Food that is high in salt (sodium). °? Food that is high in saturated fat, like full-fat dairy or red meat. °? Food that is high in trans fat, like fried food. °? Food and drinks that are high in sugar. °Lifestyle °· Do not use any products that contain nicotine or tobacco, such as cigarettes and e-cigarettes. If you need help quitting, ask your health care provider. °·  **Note Alison Howard-identified via Obfuscation** Take steps to manage and control your weight. °· Get regular exercise. Aim for 150 minutes of moderate-intensity exercise (such as walking or yoga) or 75 minutes of vigorous exercise (such as running or swimming) each week. °· Manage other health problems, such as diabetes or high blood pressure. Ask your health care provider how you can manage these conditions. °General instructions °· Do not drive for 24 hours after your procedure if you were given a medicine to help you relax (sedative). °· Take over-the-counter and prescription medicines only as told  by your health care provider. °· Avoid putting pressure on the area where the pacemaker was placed. °· If you need an MRI after your pacemaker has been placed, be sure to tell the health care provider who orders the MRI that you have a pacemaker. °· Avoid close and prolonged exposure to electrical devices that have strong magnetic fields. These include: °? Cell phones. Avoid keeping them in a pocket near the pacemaker, and try using the ear opposite the pacemaker. °? MP3 players. °? Household appliances, like microwaves. °? Metal detectors. °? Electric generators. °? High-tension wires. °· Keep all follow-up visits as directed by your health care provider. This is important. °Contact a health care provider if: °· You have pain at the incision site that is not relieved by over-the-counter or prescription medicines. °· You have any of these around your incision site or coming from it: °? More redness, swelling, or pain. °? Fluid or blood. °? Warmth to the touch. °? Pus or a bad smell. °· You have a fever. °· You feel brief, occasional palpitations, light-headedness, or any symptoms that you think might be related to your heart. °Get help right away if: °· You experience chest pain that is different from the pain at the pacemaker site. °· You develop a red streak that extends above or below the incision site. °· You experience shortness of breath. °· You have palpitations or an irregular heartbeat. °· You have light-headedness that does not go away quickly. °· You faint or have dizzy spells. °· Your pulse suddenly drops or increases rapidly and does not return to normal. °· You begin to gain weight and your legs and ankles swell. °Summary °· After your procedure, it is common to have pain, soreness, and some swelling where the pacemaker was inserted. °· Make sure to keep your incision clean and dry. Follow instructions from your health care provider about how to take care of your incision. °· Check your incision every  day for signs of infection, such as more pain or swelling, pus or a bad smell, warmth, or leaking fluid and blood. °· Avoid strenuous exercise and lifting your left arm higher than your shoulder for 2 weeks, or as long as told by your health care provider. °This information is not intended to replace advice given to you by your health care provider. Make sure you discuss any questions you have with your health care provider. °Document Released: 05/04/2013 Document Revised: 06/05/2016 Document Reviewed: 06/05/2016 °Elsevier Interactive Patient Education © 2017 [employer] ° °

## 2018-02-18 MED FILL — Lidocaine HCl Local Preservative Free (PF) Inj 1%: INTRAMUSCULAR | Qty: 30 | Status: AC

## 2018-02-21 ENCOUNTER — Other Ambulatory Visit: Payer: Self-pay | Admitting: Internal Medicine

## 2018-02-23 ENCOUNTER — Encounter (HOSPITAL_COMMUNITY): Payer: Self-pay | Admitting: Anesthesiology

## 2018-02-24 ENCOUNTER — Other Ambulatory Visit: Payer: BLUE CROSS/BLUE SHIELD

## 2018-02-24 ENCOUNTER — Ambulatory Visit: Payer: BLUE CROSS/BLUE SHIELD

## 2018-03-03 ENCOUNTER — Ambulatory Visit (INDEPENDENT_AMBULATORY_CARE_PROVIDER_SITE_OTHER): Payer: BLUE CROSS/BLUE SHIELD | Admitting: *Deleted

## 2018-03-03 ENCOUNTER — Other Ambulatory Visit: Payer: BLUE CROSS/BLUE SHIELD | Admitting: *Deleted

## 2018-03-03 DIAGNOSIS — I472 Ventricular tachycardia, unspecified: Secondary | ICD-10-CM

## 2018-03-03 DIAGNOSIS — I428 Other cardiomyopathies: Secondary | ICD-10-CM | POA: Diagnosis not present

## 2018-03-03 DIAGNOSIS — I493 Ventricular premature depolarization: Secondary | ICD-10-CM | POA: Diagnosis not present

## 2018-03-03 DIAGNOSIS — I429 Cardiomyopathy, unspecified: Secondary | ICD-10-CM | POA: Diagnosis not present

## 2018-03-03 DIAGNOSIS — Z95810 Presence of automatic (implantable) cardiac defibrillator: Secondary | ICD-10-CM

## 2018-03-03 LAB — CUP PACEART INCLINIC DEVICE CHECK
Battery Remaining Longevity: 87 mo
Brady Statistic RA Percent Paced: 0.24 %
Brady Statistic RV Percent Paced: 87 %
Date Time Interrogation Session: 20190807110526
HighPow Impedance: 47 Ohm
Implantable Lead Implant Date: 20120529
Implantable Lead Implant Date: 20120529
Implantable Lead Implant Date: 20120529
Implantable Lead Location: 753858
Implantable Lead Location: 753859
Implantable Lead Location: 753860
Implantable Pulse Generator Implant Date: 20190724
Lead Channel Impedance Value: 1125 Ohm
Lead Channel Impedance Value: 437.5 Ohm
Lead Channel Impedance Value: 487.5 Ohm
Lead Channel Pacing Threshold Amplitude: 0.75 V
Lead Channel Pacing Threshold Amplitude: 1 V
Lead Channel Pacing Threshold Amplitude: 1.5 V
Lead Channel Pacing Threshold Pulse Width: 0.5 ms
Lead Channel Pacing Threshold Pulse Width: 0.5 ms
Lead Channel Pacing Threshold Pulse Width: 0.5 ms
Lead Channel Sensing Intrinsic Amplitude: 12 mV
Lead Channel Sensing Intrinsic Amplitude: 3.7 mV
Lead Channel Setting Pacing Amplitude: 2 V
Lead Channel Setting Pacing Amplitude: 2 V
Lead Channel Setting Pacing Amplitude: 2 V
Lead Channel Setting Pacing Pulse Width: 0.5 ms
Lead Channel Setting Pacing Pulse Width: 0.5 ms
Lead Channel Setting Sensing Sensitivity: 0.5 mV
Pulse Gen Serial Number: 9842506

## 2018-03-03 LAB — BASIC METABOLIC PANEL
BUN/Creatinine Ratio: 13 (ref 9–23)
BUN: 11 mg/dL (ref 6–24)
CO2: 24 mmol/L (ref 20–29)
Calcium: 9.4 mg/dL (ref 8.7–10.2)
Chloride: 101 mmol/L (ref 96–106)
Creatinine, Ser: 0.86 mg/dL (ref 0.57–1.00)
GFR calc Af Amer: 87 mL/min/{1.73_m2} (ref >59)
GFR calc non Af Amer: 75 mL/min/{1.73_m2} (ref >59)
Glucose: 99 mg/dL (ref 65–99)
Potassium: 4.5 mmol/L (ref 3.5–5.2)
Sodium: 141 mmol/L (ref 134–144)

## 2018-03-03 NOTE — Progress Notes (Signed)
Wound check appointment. Dermabond removed. Wound without redness or edema. Incision edges approximated, wound well healed. Normal device function. Thresholds, sensing, and impedances consistent with implant measurements. Device programmed at chronic outputs, auto capture on in RV/LV. Histogram distribution appropriate for patient and level of activity. No mode switches or ventricular arrhythmias noted. BiV pacing 87%, 5.4% PVC burden per device. Patient educated about wound care, arm mobility, lifting restrictions, shock plan, and Merlin monitor. ROV with SK on 05/26/18.

## 2018-03-17 ENCOUNTER — Telehealth: Payer: Self-pay | Admitting: Internal Medicine

## 2018-03-17 NOTE — Telephone Encounter (Signed)
New Message   Pt c/o medication issue:  1. Name of Medication: Entresto   2. How are you currently taking this medication (dosage and times per day)? Take 1 tablet by mouth 2 (two) times daily  3. Are you having a reaction (difficulty breathing--STAT)? no  4. What is your medication issue? Pt states she is suppose to start taking this medication depending on her labs. Please call

## 2018-03-17 NOTE — Telephone Encounter (Signed)
LVM for return call. Per Dr Graciela Husbands, he would like for her to increase her entresto dose based off her last labs.

## 2018-03-18 MED ORDER — SACUBITRIL-VALSARTAN 49-51 MG PO TABS: 1.0000 | ORAL_TABLET | Freq: Two times a day (BID) | ORAL | 3 refills | Status: DC

## 2018-03-18 NOTE — Telephone Encounter (Signed)
Spoke with pt regarding increasing her entresto dose to 49/51mg . Per Dr Graciela Husbands, he would like to increase dosage and have BMP and BP checked in a few weeks for further titration. Appt has been set up with Pharm to assist with this. Pt agrees to plan and had no additional questions.

## 2018-03-21 ENCOUNTER — Other Ambulatory Visit: Payer: Self-pay | Admitting: Internal Medicine

## 2018-03-26 ENCOUNTER — Other Ambulatory Visit: Payer: Self-pay | Admitting: Internal Medicine

## 2018-04-05 ENCOUNTER — Ambulatory Visit (INDEPENDENT_AMBULATORY_CARE_PROVIDER_SITE_OTHER): Payer: BLUE CROSS/BLUE SHIELD

## 2018-04-05 DIAGNOSIS — I428 Other cardiomyopathies: Secondary | ICD-10-CM | POA: Diagnosis not present

## 2018-04-05 DIAGNOSIS — Z95810 Presence of automatic (implantable) cardiac defibrillator: Secondary | ICD-10-CM | POA: Diagnosis not present

## 2018-04-06 NOTE — Progress Notes (Signed)
EPIC Encounter for ICM Monitoring  Patient Name: Alison Howard is a 57 y.o. female Date: 04/06/2018 Primary Care Physican: Shirline Frees, MD Primary [place] Electrophysiologist: Caryl Comes Dry Weight:Previous weight 220lbs Bi-V Pacing: 93%     Heart Failure questions reviewed, pt asymptomatic.   Thoracic impedance normal but was abnormal suggesting fluid accumulation from 03/15/2018 - 03/23/2018.  Prescribed dosage: Furosemide 20 mg 1 tablet(20 mg total) two times a day.   Labs: 03/03/2018 Creatinine 0.86, BUN 11, Potassium 4.5, Sodium 141, EGFR 75-87 02/01/2018 Creatinine 0.94, BUN 13, Potassium 4.2, Sodium 142, EGFR 68-78  Recommendations: No changes.   Encouraged to call for fluid symptoms.  Follow-up plan: ICM clinic phone appointment on 05/06/2018.   Office appointment scheduled 05/26/2018 with Dr. Caryl Comes.    Copy of ICM check sent to Dr. Caryl Comes.   3 month ICM trend: 04/05/2018    1 Year ICM trend:       [name], RN 04/06/2018 12:38 PM

## 2018-04-08 ENCOUNTER — Encounter: Payer: Self-pay | Admitting: Pharmacist

## 2018-04-08 ENCOUNTER — Ambulatory Visit (INDEPENDENT_AMBULATORY_CARE_PROVIDER_SITE_OTHER): Payer: BLUE CROSS/BLUE SHIELD | Admitting: Pharmacist

## 2018-04-08 ENCOUNTER — Other Ambulatory Visit: Payer: Self-pay | Admitting: Internal Medicine

## 2018-04-08 VITALS — BP 100/68 | HR 71

## 2018-04-08 DIAGNOSIS — I5022 Chronic systolic (congestive) heart failure: Secondary | ICD-10-CM

## 2018-04-08 NOTE — Patient Instructions (Addendum)
Go to the lab today (BMET)  Check your blood pressure at home daily (if able) and keep record of the readings.  Take your BP meds as follows: INCREASE Entresto to 2 tablets twice daily  Call the clinic in 2 weeks if your blood pressure has done well so that we can send a prescription for the higher strength to the pharmacy. If you get dizzy or have issues please call the clinic at [phone] so that we can adjust the dose.    Bring all of your meds, your BP cuff and your record of home blood pressures to your next appointment.  Exercise as you're able, try to walk approximately 30 minutes per day.  Keep salt intake to a minimum, especially watch canned and prepared boxed foods.  Eat more fresh fruits and vegetables and fewer canned items.  Avoid eating in fast food restaurants.    HOW TO TAKE YOUR BLOOD PRESSURE: . Rest 5 minutes before taking your blood pressure. .  Don't smoke or drink caffeinated beverages for at least 30 minutes before. . Take your blood pressure before (not after) you eat. . Sit comfortably with your back supported and both feet on the floor (don't cross your legs). . Elevate your arm to heart level on a table or a desk. . Use the proper sized cuff. It should fit smoothly and snugly around your bare upper arm. There should be enough room to slip a fingertip under the cuff. The bottom edge of the cuff should be 1 inch above the crease of the elbow. . Ideally, take 3 measurements at one sitting and record the average.

## 2018-04-08 NOTE — Progress Notes (Addendum)
Patient ID: [name]                 DOB: May 07, 1961                      MRN: [id number]     HPI: Alison Howard is a 57 y.o. female patient of Dr. Graciela Howard who presents today for hypertension evaluation/medication titration. PMH significant for CHF, ECHO in nov 2018 showed EF 35-40%. Her Entresto was recently titrated to the middle dose.   She presents today for dose titration of Entresto. She states that she has done well on current dose of Entresto. She denies dizziness. She has not checked her pressure over the last few weeks. She reports her copay even with the copay card is $25 per month.   Current HTN meds:  Bisoprolol 5mg  daily in the morning eplerenone 25mg  daily every other day furosemdie 20mg  daily Entresto 49/51mg  BID  Previously tried: irbesartan (change to Entresto)  BP goal: <130/80  Diet: Eats from home. She does not use salt or uses low salt. She admits she does not eat vegetables. She drinks 1 cup of coffee and 1 diet mt. Dew 16 ounce and water mostly.   Exercise: She walks the dog a little. She does go to the pool occasionally.   Home BP readings: She has an arm cuff but has not used it.   Wt Readings from Last 3 Encounters:  02/17/18 220 lb (99.8 kg)  02/01/18 223 lb 9.6 oz (101.4 kg)  11/22/17 234 lb (106.1 kg)   BP Readings from Last 3 Encounters:  04/08/18 100/68  02/17/18 126/88  02/01/18 130/72   Pulse Readings from Last 3 Encounters:  04/08/18 71  02/17/18 80  02/01/18 74    Renal function: CrCl cannot be calculated (Patient's most recent lab result is older than the maximum 21 days allowed.).  Past Medical History:  Diagnosis Date  . Chronic systolic heart failure (HCC) 04/25/2013  . GERD (gastroesophageal reflux disease)   . Hypercholesteremia   . LBBB (left bundle branch block)   . Morbid obesity (HCC) 12/28/2014  . NICM (nonischemic cardiomyopathy) (HCC)    a. s/p STJ CRTD  . OSA (obstructive sleep apnea) 12/28/2014   Severe with AHI 27  events per hour on average and the highest AHI was 40 events per hour  . Ventricular tachycardia (HCC)    a. s/p appropriate ICD therapy    Current Outpatient Medications on File Prior to Visit  Medication Sig Dispense Refill  . bisoprolol (ZEBETA) 5 MG tablet Take 1 tablet (5 mg total) by mouth daily. 90 tablet 3  . citalopram (CELEXA) 40 MG tablet Take 40 mg by mouth daily.     . flecainide (TAMBOCOR) 50 MG tablet TAKE 1 TABLET BY MOUTH TWICE A DAY 180 tablet 3  . furosemide (LASIX) 20 MG tablet Take 1 tablet (20 mg total) by mouth 2 (two) times daily. (Patient taking differently: Take 20 mg by mouth daily. ) 60 tablet 10  . pantoprazole (PROTONIX) 40 MG tablet Take 40 mg by mouth 2 (two) times daily.     . sacubitril-valsartan (ENTRESTO) 49-51 MG Take 2 tablets by mouth 2 (two) times daily. 60 tablet 3   No current facility-administered medications on file prior to visit.     Allergies  Allergen Reactions  . Ciprofloxacin Hcl Anaphylaxis  . Codeine Anaphylaxis and Nausea And Vomiting  . Morphine And Related Anaphylaxis  . Sulfa Antibiotics  Anaphylaxis    Blood pressure 100/68, pulse 71, SpO2 96 %.   Assessment/Plan: Hypertension: BMET today. Pressure on the low side today. Will titrate dose to high dose, but will have her take 2 tablets of the low dose BID. She will monitor her pressure closely. If pressure is stable in 2 weeks will send a prescription for higher strength. She will call with report of pressures. Follow up with HTN clinic as needed and Dr. Graciela Howard as scheduled.    Thank you, Alison Howard. Alison Howard, PharmD  [facility]  04/08/2018 3:59 PM   ADDENDUM: BMET returned WNL. Will proceed with plan as above.

## 2018-04-09 LAB — BASIC METABOLIC PANEL
BUN/Creatinine Ratio: 12 (ref 9–23)
BUN: 10 mg/dL (ref 6–24)
CO2: 24 mmol/L (ref 20–29)
Calcium: 9.7 mg/dL (ref 8.7–10.2)
Chloride: 98 mmol/L (ref 96–106)
Creatinine, Ser: 0.81 mg/dL (ref 0.57–1.00)
GFR calc Af Amer: 93 mL/min/{1.73_m2} (ref >59)
GFR calc non Af Amer: 81 mL/min/{1.73_m2} (ref >59)
Glucose: 85 mg/dL (ref 65–99)
Potassium: 4.4 mmol/L (ref 3.5–5.2)
Sodium: 139 mmol/L (ref 134–144)

## 2018-04-17 DIAGNOSIS — Z803 Family history of malignant neoplasm of breast: Secondary | ICD-10-CM | POA: Diagnosis not present

## 2018-04-17 DIAGNOSIS — Z1213 Encounter for screening for malignant neoplasm of small intestine: Secondary | ICD-10-CM | POA: Diagnosis not present

## 2018-04-20 ENCOUNTER — Telehealth: Payer: Self-pay | Admitting: Pharmacist

## 2018-04-20 DIAGNOSIS — I5022 Chronic systolic (congestive) heart failure: Secondary | ICD-10-CM

## 2018-04-20 MED ORDER — SACUBITRIL-VALSARTAN 97-103 MG PO TABS: 1.0000 | ORAL_TABLET | Freq: Two times a day (BID) | ORAL | 3 refills | Status: DC

## 2018-04-20 NOTE — Telephone Encounter (Signed)
Called patient to follow up on higher dose of Entresto. She states that she has done well with dose increase and pressures have been running in the normal range 120s-130s/80s.   Will send higher strength to pharmacy and order BMET for follow up visit with Dr. Graciela Husbands as scheduled. She is aware to call with any issues in the meantime.

## 2018-05-06 ENCOUNTER — Ambulatory Visit (INDEPENDENT_AMBULATORY_CARE_PROVIDER_SITE_OTHER): Payer: BLUE CROSS/BLUE SHIELD

## 2018-05-06 ENCOUNTER — Telehealth: Payer: Self-pay

## 2018-05-06 DIAGNOSIS — Z95810 Presence of automatic (implantable) cardiac defibrillator: Secondary | ICD-10-CM | POA: Diagnosis not present

## 2018-05-06 DIAGNOSIS — I5022 Chronic systolic (congestive) heart failure: Secondary | ICD-10-CM | POA: Diagnosis not present

## 2018-05-06 NOTE — Progress Notes (Signed)
EPIC Encounter for ICM Monitoring  Patient Name: Alison Howard is a 57 y.o. female Date: 05/06/2018 Primary Care Physican: Shirline Frees, MD Primary [place] Electrophysiologist: Caryl Comes Dry Weight:Previous weight220lbs Bi-V Pacing: 94%      Attempted call to patient and unable to reach.  Transmission reviewed.    Thoracic impedance normal.   Prescribed: Furosemide 20 mg 1 tablet(20 mg total) two times a day.   Labs: 03/03/2018 Creatinine 0.86, BUN 11, Potassium 4.5, Sodium 141, EGFR 75-87 02/01/2018 Creatinine 0.94, BUN 13, Potassium 4.2, Sodium 142, EGFR 68-78  Recommendations: Unable to reach.  Follow-up plan: ICM clinic phone appointment on 06/28/2018.   Office appointment scheduled 05/26/2018 with Dr. Caryl Comes.    Copy of ICM check sent to Dr. Caryl Comes.   3 month ICM trend: 05/06/2018    1 Year ICM trend:       [name], RN 05/06/2018 2:56 PM

## 2018-05-06 NOTE — Telephone Encounter (Signed)
Remote ICM transmission received.  Attempted call to patient and recording stated mail box is full and not accepting calls.

## 2018-05-19 DIAGNOSIS — Z23 Encounter for immunization: Secondary | ICD-10-CM | POA: Diagnosis not present

## 2018-05-19 DIAGNOSIS — E7800 Pure hypercholesterolemia, unspecified: Secondary | ICD-10-CM | POA: Diagnosis not present

## 2018-05-19 DIAGNOSIS — F325 Major depressive disorder, single episode, in full remission: Secondary | ICD-10-CM | POA: Diagnosis not present

## 2018-05-19 DIAGNOSIS — Z1159 Encounter for screening for other viral diseases: Secondary | ICD-10-CM | POA: Diagnosis not present

## 2018-05-19 DIAGNOSIS — K219 Gastro-esophageal reflux disease without esophagitis: Secondary | ICD-10-CM | POA: Diagnosis not present

## 2018-05-19 DIAGNOSIS — Z0000 Encounter for general adult medical examination without abnormal findings: Secondary | ICD-10-CM | POA: Diagnosis not present

## 2018-05-19 DIAGNOSIS — I5022 Chronic systolic (congestive) heart failure: Secondary | ICD-10-CM | POA: Diagnosis not present

## 2018-05-19 DIAGNOSIS — E039 Hypothyroidism, unspecified: Secondary | ICD-10-CM | POA: Diagnosis not present

## 2018-05-20 ENCOUNTER — Encounter: Payer: BLUE CROSS/BLUE SHIELD | Admitting: Internal Medicine

## 2018-05-25 NOTE — Progress Notes (Signed)
Patient Care Team: Johny Blamer, MD as PCP - General (Family Medicine)   HPI  Alison Howard is a 57 y.o. female Seen in followup for CRT-D implanted at [facility] for cardiomyopathy presumed nonischemic of >5 yrs duration  She underwent device generator replacement 7/19  She was seen in 6/15 following appropriate therapies for VT-ATP      Feeling better with less shortness of breath.  No chest pain.  No edema.  With less ectopy.  Biggest complaint is itching at the superior aspect of her device but not related to the overlying keloid.  DATE TEST EF   8/13  Echo  40-45%   12/15    Echo   25-30 %   6/17    Echo   35%   6/18 Echo   40-45%   11/18 Echo  35-40%      DATE PVC %   8/17 11 Multiple morphologies  6/18 22  Multiple morphologies  10/18 5.2 From device   7/19 >10%   10/19 3.1%    Date Cr K Mg  5/17  0.8 4.1 2.1   6/18  0.78 4.2   9/19 0.81 4.4        Past Medical History:  Diagnosis Date  . Chronic systolic heart failure (HCC) 04/25/2013  . GERD (gastroesophageal reflux disease)   . Hypercholesteremia   . LBBB (left bundle branch block)   . Morbid obesity (HCC) 12/28/2014  . NICM (nonischemic cardiomyopathy) (HCC)    a. s/p STJ CRTD  . OSA (obstructive sleep apnea) 12/28/2014   Severe with AHI 27 events per hour on average and the highest AHI was 40 events per hour  . Ventricular tachycardia (HCC)    a. s/p appropriate ICD therapy    Past Surgical History:  Procedure Laterality Date  . BIV ICD GENERATOR CHANGEOUT N/A 02/17/2018   Procedure: BIV ICD GENERATOR CHANGEOUT;  Surgeon: Duke Salvia, MD;  Location: [facility] INVASIVE CV LAB;  Service: Cardiovascular;  Laterality: N/A;  . CARDIAC DEFIBRILLATOR PLACEMENT  2012   a. STJ CRTD implanted for NICM, CHF  . TOTAL ABDOMINAL HYSTERECTOMY      Current Outpatient Medications  Medication Sig Dispense Refill  . bisoprolol (ZEBETA) 5 MG tablet Take 1 tablet (5 mg total) by mouth daily. 90 tablet 3  .  citalopram (CELEXA) 40 MG tablet Take 40 mg by mouth daily.     Marland Kitchen eplerenone (INSPRA) 25 MG tablet TAKE 0.5 TABLETS (12.5 MG TOTAL) BY MOUTH DAILY. 45 tablet 3  . flecainide (TAMBOCOR) 50 MG tablet TAKE 1 TABLET BY MOUTH TWICE A DAY 180 tablet 3  . furosemide (LASIX) 20 MG tablet Take 20 mg by mouth 2 times daily at 12 noon and 4 pm.    . pantoprazole (PROTONIX) 40 MG tablet Take 40 mg by mouth 2 (two) times daily.     . sacubitril-valsartan (ENTRESTO) 97-103 MG Take 1 tablet by mouth 2 (two) times daily. 180 tablet 3   No current facility-administered medications for this visit.     Allergies  Allergen Reactions  . Ciprofloxacin Hcl Anaphylaxis  . Codeine Anaphylaxis and Nausea And Vomiting  . Morphine And Related Anaphylaxis  . Sulfa Antibiotics Anaphylaxis    Review of Systems negative except from HPI and PMH  Physical Exam BP 104/80   Pulse 75   Ht 5\' 2"  (1.575 m)   Wt 229 lb 3.2 oz (104 kg)   SpO2 94%   BMI  41.92 kg/m  Well developed and nourished in no acute distress HENT normal Neck supple with JVP-flat Clear Device pocket well healed; without hematoma or erythema.  There is no tethering  Regular rate and rhythm, no murmurs or gallops Abd-soft with active BS No Clubbing cyanosis edema Skin-warm and dry A & Oriented  Grossly normal sensory and motor function   ECG demonstrates sinus at 75 with P synchronous pacing Intervals 07/10/48 Negative QRS lead V1 QR lead I Infrequent PVC and one fused beat  Assessment and  Plan  Nonischemic cardiomyopathy  Congestive heart failure- chronic-systolic  PVCs  Itching at device sie   CRT-D [employer]  The patient's device was interrogated.  The information was reviewed. No changes were made in the programming.     Morbidly obese     PVC burden is a significantly attenuated by Entresto/flecainide  We will recheck her echocardiogram to see whether with decreased PVCs there is improvement in left ventricular  function  Euvolemic continue current meds  Surveillance labs were in order 9/19  We spent more than 50% of our >25 min visit in face to face counseling regarding the above

## 2018-05-26 ENCOUNTER — Encounter: Payer: Self-pay | Admitting: Internal Medicine

## 2018-05-26 ENCOUNTER — Ambulatory Visit: Payer: BLUE CROSS/BLUE SHIELD | Admitting: Internal Medicine

## 2018-05-26 ENCOUNTER — Other Ambulatory Visit: Payer: BLUE CROSS/BLUE SHIELD | Admitting: *Deleted

## 2018-05-26 VITALS — BP 104/80 | HR 75 | Ht 62.0 in | Wt 229.2 lb

## 2018-05-26 DIAGNOSIS — I428 Other cardiomyopathies: Secondary | ICD-10-CM | POA: Diagnosis not present

## 2018-05-26 DIAGNOSIS — Z95810 Presence of automatic (implantable) cardiac defibrillator: Secondary | ICD-10-CM

## 2018-05-26 DIAGNOSIS — I5022 Chronic systolic (congestive) heart failure: Secondary | ICD-10-CM

## 2018-05-26 DIAGNOSIS — G4733 Obstructive sleep apnea (adult) (pediatric): Secondary | ICD-10-CM | POA: Diagnosis not present

## 2018-05-26 LAB — CUP PACEART INCLINIC DEVICE CHECK
Battery Remaining Longevity: 84 mo
Brady Statistic RA Percent Paced: 0.27 %
Date Time Interrogation Session: 20191030125949
HighPow Impedance: 50 Ohm
Implantable Lead Implant Date: 20120529
Implantable Lead Implant Date: 20120529
Implantable Lead Implant Date: 20120529
Implantable Lead Location: 753858
Implantable Lead Location: 753859
Implantable Lead Location: 753860
Implantable Pulse Generator Implant Date: 20190724
Lead Channel Impedance Value: 1100 Ohm
Lead Channel Impedance Value: 387.5 Ohm
Lead Channel Impedance Value: 487.5 Ohm
Lead Channel Pacing Threshold Amplitude: 0.75 V
Lead Channel Pacing Threshold Amplitude: 1.25 V
Lead Channel Pacing Threshold Amplitude: 1.25 V
Lead Channel Pacing Threshold Pulse Width: 0.5 ms
Lead Channel Pacing Threshold Pulse Width: 0.5 ms
Lead Channel Pacing Threshold Pulse Width: 0.5 ms
Lead Channel Sensing Intrinsic Amplitude: 12 mV
Lead Channel Sensing Intrinsic Amplitude: 3.2 mV
Lead Channel Setting Pacing Amplitude: 2 V
Lead Channel Setting Pacing Amplitude: 2 V
Lead Channel Setting Pacing Amplitude: 2.25 V
Lead Channel Setting Pacing Pulse Width: 0.5 ms
Lead Channel Setting Pacing Pulse Width: 0.5 ms
Lead Channel Setting Sensing Sensitivity: 0.5 mV
Pulse Gen Serial Number: 9842506

## 2018-05-26 LAB — BASIC METABOLIC PANEL
BUN/Creatinine Ratio: 10 (ref 9–23)
BUN: 9 mg/dL (ref 6–24)
CO2: 26 mmol/L (ref 20–29)
Calcium: 9.4 mg/dL (ref 8.7–10.2)
Chloride: 101 mmol/L (ref 96–106)
Creatinine, Ser: 0.88 mg/dL (ref 0.57–1.00)
GFR calc Af Amer: 84 mL/min/{1.73_m2} (ref >59)
GFR calc non Af Amer: 73 mL/min/{1.73_m2} (ref >59)
Glucose: 83 mg/dL (ref 65–99)
Potassium: 4.3 mmol/L (ref 3.5–5.2)
Sodium: 143 mmol/L (ref 134–144)

## 2018-05-26 NOTE — Patient Instructions (Addendum)
Medication Instructions:  Your physician recommends that you continue on your current medications as directed. Please refer to the Current Medication list given to you today.  If you need a refill on your cardiac medications before your next appointment, please call your pharmacy.   Lab work: None ordered  Testing/Procedures: Your physician has requested that you have an echocardiogram in December. Echocardiography is a painless test that uses sound waves to create images of your heart. It provides your doctor with information about the size and shape of your heart and how well your heart's chambers and valves are working. This procedure takes approximately one hour. There are no restrictions for this procedure.  Follow-Up: Remote monitoring is used to monitor your ICD from home. This monitoring reduces the number of office visits required to check your device to one time per year. It allows [place] to keep an eye on the functioning of your device to ensure it is working properly. You are scheduled for a device check from home on 08/25/2018. You may send your transmission at any time that day. If you have a wireless device, the transmission will be sent automatically. After your physician reviews your transmission, you will receive a postcard with your next transmission date.  At [facility], you and your health needs are our priority.  As part of our continuing mission to provide you with exceptional heart care, we have created designated Provider Care Teams.  These Care Teams include your primary Cardiologist (physician) and Advanced Practice Providers (APPs -  Physician Assistants and Nurse Practitioners) who all work together to provide you with the care you need, when you need it. You will need a follow up appointment in 6 months.  Please call our office 2 months in advance to schedule this appointment.  You may see Dr. Graciela Husbands or one of the following Advanced Practice Providers on your designated  Care Team:   Gypsy Balsam, NP . Francis Dowse, PA-C  Thank you for choosing [facility]!!    Any Other Special Instructions Will Be Listed Below (If Applicable).

## 2018-05-28 DIAGNOSIS — M8588 Other specified disorders of bone density and structure, other site: Secondary | ICD-10-CM | POA: Diagnosis not present

## 2018-05-28 DIAGNOSIS — Z90710 Acquired absence of both cervix and uterus: Secondary | ICD-10-CM | POA: Diagnosis not present

## 2018-05-28 DIAGNOSIS — Z780 Asymptomatic menopausal state: Secondary | ICD-10-CM | POA: Diagnosis not present

## 2018-05-28 DIAGNOSIS — Z8262 Family history of osteoporosis: Secondary | ICD-10-CM | POA: Diagnosis not present

## 2018-06-28 ENCOUNTER — Telehealth: Payer: Self-pay

## 2018-06-28 ENCOUNTER — Ambulatory Visit (INDEPENDENT_AMBULATORY_CARE_PROVIDER_SITE_OTHER): Payer: BLUE CROSS/BLUE SHIELD

## 2018-06-28 DIAGNOSIS — I5022 Chronic systolic (congestive) heart failure: Secondary | ICD-10-CM | POA: Diagnosis not present

## 2018-06-28 DIAGNOSIS — Z95810 Presence of automatic (implantable) cardiac defibrillator: Secondary | ICD-10-CM | POA: Diagnosis not present

## 2018-06-28 NOTE — Progress Notes (Signed)
EPIC Encounter for ICM Monitoring  Patient Name: Alison Howard is a 57 y.o. female Date: 06/28/2018 Primary Care Physican: Harris, William, MD Primary Cardiologist:Klein Electrophysiologist: Klein Bi-V Pacing: 90%   Last Weight: 220lbs Today's Weight: unknown       Attempted call to patient and unable to reach.  Left detailed message, per DPR, regarding transmission.  Transmission reviewed.    Thoracic impedance normal but was abnormal suggesting fluid accumulation from 06/13/2018 - 06/19/2018.   Prescribed: Furosemide 20 mg 1 tablet(20 mg total) two times a day.   Labs: 03/03/2018 Creatinine 0.86, BUN 11, Potassium 4.5, Sodium 141, EGFR 75-87 02/01/2018 Creatinine 0.94, BUN 13, Potassium 4.2, Sodium 142, EGFR 68-78  Recommendations: Left voice mail with ICM number and encouraged to call if experiencing any fluid symptoms.  Follow-up plan: ICM clinic phone appointment on 07/29/2018.    Copy of ICM check sent to Dr. Klein.   3 month ICM trend: 06/28/2018    1 Year ICM trend:       Laurie S Short, RN 06/28/2018 12:20 PM   

## 2018-06-28 NOTE — Telephone Encounter (Signed)
Remote ICM transmission received.  Attempted call to patient regarding ICM remote transmission and left detailed message, per DPR, with next ICM remote transmission date of 1/2/202020.  Advised to return call for any fluid symptoms or questions.

## 2018-07-10 DIAGNOSIS — J029 Acute pharyngitis, unspecified: Secondary | ICD-10-CM | POA: Diagnosis not present

## 2018-07-10 DIAGNOSIS — R0982 Postnasal drip: Secondary | ICD-10-CM | POA: Diagnosis not present

## 2018-07-10 DIAGNOSIS — J069 Acute upper respiratory infection, unspecified: Secondary | ICD-10-CM | POA: Diagnosis not present

## 2018-07-14 DIAGNOSIS — H6122 Impacted cerumen, left ear: Secondary | ICD-10-CM | POA: Diagnosis not present

## 2018-07-14 DIAGNOSIS — H6522 Chronic serous otitis media, left ear: Secondary | ICD-10-CM | POA: Diagnosis not present

## 2018-07-14 DIAGNOSIS — H6692 Otitis media, unspecified, left ear: Secondary | ICD-10-CM | POA: Diagnosis not present

## 2018-07-14 DIAGNOSIS — H6062 Unspecified chronic otitis externa, left ear: Secondary | ICD-10-CM | POA: Diagnosis not present

## 2018-07-19 DIAGNOSIS — H6692 Otitis media, unspecified, left ear: Secondary | ICD-10-CM | POA: Diagnosis not present

## 2018-07-19 DIAGNOSIS — H6062 Unspecified chronic otitis externa, left ear: Secondary | ICD-10-CM | POA: Diagnosis not present

## 2018-07-22 ENCOUNTER — Other Ambulatory Visit: Payer: Self-pay

## 2018-07-22 ENCOUNTER — Ambulatory Visit (HOSPITAL_COMMUNITY): Payer: BLUE CROSS/BLUE SHIELD | Attending: Cardiovascular Disease

## 2018-07-22 ENCOUNTER — Encounter (INDEPENDENT_AMBULATORY_CARE_PROVIDER_SITE_OTHER): Payer: Self-pay

## 2018-07-22 DIAGNOSIS — I428 Other cardiomyopathies: Secondary | ICD-10-CM | POA: Diagnosis not present

## 2018-07-27 ENCOUNTER — Telehealth: Payer: Self-pay

## 2018-07-27 DIAGNOSIS — I5043 Acute on chronic combined systolic (congestive) and diastolic (congestive) heart failure: Secondary | ICD-10-CM

## 2018-07-27 DIAGNOSIS — R9430 Abnormal result of cardiovascular function study, unspecified: Secondary | ICD-10-CM

## 2018-07-27 DIAGNOSIS — R931 Abnormal findings on diagnostic imaging of heart and coronary circulation: Secondary | ICD-10-CM

## 2018-07-27 NOTE — Telephone Encounter (Signed)
-----   Message from Duke Salvia, MD sent at 07/26/2018  8:37 PM EST ----- Please Inform Patient Echo showed unfortuantely worsening heart muscle function ; would like to suggest we have her get a consult from CHF clinic just to get their input Thanks

## 2018-07-27 NOTE — Telephone Encounter (Signed)
Spoke with pt and discussed echo results. Pt agrees to be seen in the [facility] and a referral has been made. Pt did not have any additional questions and has verbalized understanding.

## 2018-07-29 ENCOUNTER — Ambulatory Visit (INDEPENDENT_AMBULATORY_CARE_PROVIDER_SITE_OTHER): Payer: BLUE CROSS/BLUE SHIELD

## 2018-07-29 DIAGNOSIS — I5022 Chronic systolic (congestive) heart failure: Secondary | ICD-10-CM | POA: Diagnosis not present

## 2018-07-29 DIAGNOSIS — Z95810 Presence of automatic (implantable) cardiac defibrillator: Secondary | ICD-10-CM

## 2018-07-29 NOTE — Progress Notes (Signed)
EPIC Encounter for ICM Monitoring  Patient Name: Alison Howard is a 58 y.o. female Date: 07/29/2018 Primary Care Physican: Shirline Frees, MD Primary [place] Electrophysiologist: Vergie Living Pacing: 91% Last Weight: 220lbs Today's Weight: unknown  Spoke with patient for monthly ICM follow up.  Heart failure questions reviewed. Advised of remote transmission results.     Symptoms: She has been very sick with a respiratory infection during the exact time of decreased impedance.  She is starting to feel better.  Corvue thoracic impedance normal but was abnormal suggesting fluid accumulation 07/08/2018 through 07/28/2018.   Prescribed: Furosemide 20 mg 1 tablet(20 mg total) two times a day.   Labs: 05/26/2018 Creatinine 0.88, BUN 9,   Potassium 4.3, Sodium 143, eGFR 73-84 04/08/2018 Creatinine 0.81, BUN 10, Potassium 4.4, Sodium 139, eGFR 81-93 03/03/2018 Creatinine 0.86, BUN 11, Potassium 4.5, Sodium 141, EGFR 75-87 02/01/2018 Creatinine 0.94, BUN 13, Potassium 4.2, Sodium 142, EGFR 68-78  Recommendations: Encouraged to call for fluid symptoms.   ICM Follow up plan: 08/30/2018.        Copy of ICM check sent to Dr. Caryl Comes.          3 month ICM trend: 07/29/2018    1 Year ICM trend:     [name], RN 07/29/2018 4:47 PM

## 2018-08-24 ENCOUNTER — Telehealth: Payer: Self-pay | Admitting: Internal Medicine

## 2018-08-24 NOTE — Telephone Encounter (Signed)
New Message:    Patient calling about a referral was suppose to be sent out and patient states she has not heard from anyone. Please call patient.

## 2018-08-30 ENCOUNTER — Ambulatory Visit (INDEPENDENT_AMBULATORY_CARE_PROVIDER_SITE_OTHER): Payer: BLUE CROSS/BLUE SHIELD

## 2018-08-30 DIAGNOSIS — I639 Cerebral infarction, unspecified: Secondary | ICD-10-CM

## 2018-08-31 ENCOUNTER — Ambulatory Visit (INDEPENDENT_AMBULATORY_CARE_PROVIDER_SITE_OTHER): Payer: BLUE CROSS/BLUE SHIELD

## 2018-08-31 DIAGNOSIS — Z95810 Presence of automatic (implantable) cardiac defibrillator: Secondary | ICD-10-CM | POA: Diagnosis not present

## 2018-08-31 DIAGNOSIS — I5022 Chronic systolic (congestive) heart failure: Secondary | ICD-10-CM | POA: Diagnosis not present

## 2018-08-31 NOTE — Progress Notes (Signed)
EPIC Encounter for ICM Monitoring  Patient Name: Alison Howard is a 58 y.o. female Date: 08/31/2018 Primary Care Physican: Shirline Frees, MD Primary [place] Electrophysiologist: Vergie Living Pacing: 92% Last Weight:220lbs Today's Weight: 218 lbs  Heart failure questions reviewed reviewed.  She is asymptomatic.  She is currently under a lot of stress so could be related to how she is eating at this time.   Corvue thoracic impedance abnormal suggesting fluid accumulation since 08/22/2018.   Prescribed: Furosemide 20 mg 1 tablet(20 mg total) two times a day.   Labs: 05/26/2018 Creatinine 0.88, BUN 9,   Potassium 4.3, Sodium 143, eGFR 73-84 04/08/2018 Creatinine 0.81, BUN 10, Potassium 4.4, Sodium 139, eGFR 81-93 03/03/2018 Creatinine 0.86, BUN 11, Potassium 4.5, Sodium 141, EGFR 75-87 02/01/2018 Creatinine 0.94, BUN 13, Potassium 4.2, Sodium 142, EGFR 68-78  Recommendations: Advised to increase Furosemide to 40 mg twice a day x 3 days and then return to 20 mg twice a day.   ICM Follow up plan: 09/13/2018 (manual send) to recheck fluid levels.  New referral to HF clinic and appt scheduled 10/01/2018 with Dr Aundra Dubin.        Copy of ICM check sent to Dr. Caryl Comes.    3 month ICM trend: 08/30/2018    1 Year ICM trend:       [name], RN 08/31/2018 10:17 AM

## 2018-09-01 LAB — CUP PACEART REMOTE DEVICE CHECK
Battery Remaining Longevity: 79 mo
Battery Remaining Percentage: 92 %
Battery Voltage: 3.16 V
Brady Statistic AP VP Percent: 2.2 %
Brady Statistic AP VS Percent: 1 %
Brady Statistic AS VP Percent: 90 %
Brady Statistic AS VS Percent: 4.5 %
Brady Statistic RA Percent Paced: 1 %
Date Time Interrogation Session: 20200203122418
HighPow Impedance: 47 Ohm
HighPow Impedance: 47 Ohm
Implantable Lead Implant Date: 20120529
Implantable Lead Implant Date: 20120529
Implantable Lead Implant Date: 20120529
Implantable Lead Location: 753858
Implantable Lead Location: 753859
Implantable Lead Location: 753860
Implantable Pulse Generator Implant Date: 20190724
Lead Channel Impedance Value: 1025 Ohm
Lead Channel Impedance Value: 390 Ohm
Lead Channel Impedance Value: 490 Ohm
Lead Channel Pacing Threshold Amplitude: 0.75 V
Lead Channel Pacing Threshold Amplitude: 1.25 V
Lead Channel Pacing Threshold Amplitude: 1.5 V
Lead Channel Pacing Threshold Pulse Width: 0.5 ms
Lead Channel Pacing Threshold Pulse Width: 0.5 ms
Lead Channel Pacing Threshold Pulse Width: 0.5 ms
Lead Channel Sensing Intrinsic Amplitude: 12 mV
Lead Channel Sensing Intrinsic Amplitude: 3.3 mV
Lead Channel Setting Pacing Amplitude: 2 V
Lead Channel Setting Pacing Amplitude: 2 V
Lead Channel Setting Pacing Amplitude: 2.25 V
Lead Channel Setting Pacing Pulse Width: 0.5 ms
Lead Channel Setting Pacing Pulse Width: 0.5 ms
Lead Channel Setting Sensing Sensitivity: 0.5 mV
Pulse Gen Serial Number: 9842506

## 2018-09-03 ENCOUNTER — Telehealth: Payer: Self-pay

## 2018-09-03 ENCOUNTER — Ambulatory Visit (INDEPENDENT_AMBULATORY_CARE_PROVIDER_SITE_OTHER): Payer: BLUE CROSS/BLUE SHIELD

## 2018-09-03 DIAGNOSIS — Z95810 Presence of automatic (implantable) cardiac defibrillator: Secondary | ICD-10-CM

## 2018-09-03 DIAGNOSIS — I5022 Chronic systolic (congestive) heart failure: Secondary | ICD-10-CM

## 2018-09-03 NOTE — Telephone Encounter (Signed)
Remote ICM transmission received.  Attempted call to patient regarding ICM remote transmission and mail box is full. 

## 2018-09-03 NOTE — Progress Notes (Signed)
EPIC Encounter for ICM Monitoring  Patient Name: RONNIKA COLLETT is a 58 y.o. female Date: 09/03/2018 Primary Care Physican: Shirline Frees, MD Primary [place] Electrophysiologist: Vergie Living Pacing: 92% Last Weight:218lbs Today's Weight: unknown  Attempted call to patient and unable to reach.  Transmission reviewed.   Corvue thoracic impedance returned to normal after taking 3 days of extra Furosemide.  Prescribed:Furosemide 20 mg 1 tablet(20 mg total) two times a day.   Labs: 05/26/2018 Creatinine 0.88, BUN 9, Potassium 4.3, Sodium 143, eGFR 73-84 04/08/2018 Creatinine 0.81, BUN 10, Potassium 4.4, Sodium 139, eGFR 81-93 03/03/2018 Creatinine 0.86, BUN 11, Potassium 4.5, Sodium 141, EGFR 75-87 02/01/2018 Creatinine 0.94, BUN 13, Potassium 4.2, Sodium 142, EGFR 68-78  Recommendations:Unable to reach.  Follow-up plan: ICM clinic phone appointment on 10/04/2018.  New referral to HF clinic and appt scheduled 10/01/2018 with Dr Aundra Dubin.  Copy of ICM check sent to Dr. Caryl Comes.   3 month ICM trend: 09/03/2018    1 Year ICM trend:       [name], RN 09/03/2018 8:04 AM

## 2018-09-07 NOTE — Progress Notes (Signed)
Carelink Summary Report / Loop Recorder 

## 2018-10-01 ENCOUNTER — Ambulatory Visit (HOSPITAL_COMMUNITY)
Admission: RE | Admit: 2018-10-01 | Discharge: 2018-10-01 | Disposition: A | Discharge status: Home / Self Care | Payer: BLUE CROSS/BLUE SHIELD | Source: Ambulatory Visit | Attending: Cardiology | Admitting: Cardiology

## 2018-10-01 ENCOUNTER — Other Ambulatory Visit (HOSPITAL_COMMUNITY): Payer: Self-pay

## 2018-10-01 VITALS — BP 132/70 | HR 70 | Wt 228.0 lb

## 2018-10-01 DIAGNOSIS — Z79899 Other long term (current) drug therapy: Secondary | ICD-10-CM | POA: Insufficient documentation

## 2018-10-01 DIAGNOSIS — E785 Hyperlipidemia, unspecified: Secondary | ICD-10-CM | POA: Insufficient documentation

## 2018-10-01 DIAGNOSIS — I5022 Chronic systolic (congestive) heart failure: Secondary | ICD-10-CM

## 2018-10-01 DIAGNOSIS — Z6841 Body Mass Index (BMI) 40.0 and over, adult: Secondary | ICD-10-CM | POA: Diagnosis not present

## 2018-10-01 DIAGNOSIS — E669 Obesity, unspecified: Secondary | ICD-10-CM | POA: Insufficient documentation

## 2018-10-01 DIAGNOSIS — I493 Ventricular premature depolarization: Secondary | ICD-10-CM | POA: Diagnosis not present

## 2018-10-01 DIAGNOSIS — G4733 Obstructive sleep apnea (adult) (pediatric): Secondary | ICD-10-CM | POA: Insufficient documentation

## 2018-10-01 LAB — CBC
HCT: 49.4 % — ABNORMAL HIGH (ref 36.0–46.0)
Hemoglobin: 15.3 g/dL — ABNORMAL HIGH (ref 12.0–15.0)
MCH: 29.1 pg (ref 26.0–34.0)
MCHC: 31 g/dL (ref 30.0–36.0)
MCV: 93.9 fL (ref 80.0–100.0)
Platelets: 364 10*3/uL (ref 150–400)
RBC: 5.26 MIL/uL — ABNORMAL HIGH (ref 3.87–5.11)
RDW: 13.9 % (ref 11.5–15.5)
WBC: 8.7 10*3/uL (ref 4.0–10.5)
nRBC: 0 % (ref 0.0–0.2)

## 2018-10-01 LAB — BASIC METABOLIC PANEL
Anion gap: 10 (ref 5–15)
BUN: 12 mg/dL (ref 6–20)
CO2: 23 mmol/L (ref 22–32)
Calcium: 8.7 mg/dL — ABNORMAL LOW (ref 8.9–10.3)
Chloride: 104 mmol/L (ref 98–111)
Creatinine, Ser: 0.79 mg/dL (ref 0.44–1.00)
GFR calc Af Amer: >60 mL/min (ref >60)
GFR calc non Af Amer: >60 mL/min (ref >60)
Glucose, Bld: 96 mg/dL (ref 70–99)
Potassium: 4.9 mmol/L (ref 3.5–5.1)
Sodium: 137 mmol/L (ref 135–145)

## 2018-10-01 LAB — PROTIME-INR
INR: 0.9 (ref 0.8–1.2)
Prothrombin Time: 12.2 seconds (ref 11.4–15.2)

## 2018-10-01 MED ORDER — EPLERENONE 25 MG PO TABS: 25.0000 mg | ORAL_TABLET | Freq: Every day | ORAL | 1 refills | Status: DC

## 2018-10-01 NOTE — Patient Instructions (Signed)
INCREASE Eplerenone to 25mg  (1 tab) daily  You have been referred to Cardiac Rehab, they will call you to schedule this appointment.  Labs today and repeat in 10 days We will only contact you if something comes back abnormal or we need to make some changes. Otherwise no news is good news!  Your physician has recommended that you have a cardiopulmonary stress test (CPX). CPX testing is a non-invasive measurement of heart and lung function. It replaces a traditional treadmill stress test. This type of test provides a tremendous amount of information that relates not only to your present condition but also for future outcomes. This test combines measurements of you ventilation, respiratory gas exchange in the lungs, electrocardiogram (EKG), blood pressure and physical response before, during, and following an exercise protocol.  Your physician recommends that you schedule a follow-up appointment in: 2 weeks after your Heart Catheterization  Do the following things EVERYDAY: 1) Weigh yourself in the morning before breakfast. Write it down and keep it in a log. 2) Take your medicines as prescribed 3) Eat low salt foods-Limit salt (sodium) to 2000 mg per day.  4) Stay as active as you can everyday 5) Limit all fluids for the day to less than 2 liters     [name] [address] [id number] [facility] [place] [address] Dept: [phone] Loc: [phone]  Alison Howard  10/01/2018  You are scheduled for a Cardiac Catheterization on Thursday, March 12 with Dr. Marca Howard.  1. Please arrive at the [facility] (Main Entrance A) at [facility]: [address] [place], [place] [address] at 8:30 AM (This time is two hours before your procedure to ensure your preparation). Free valet parking service is available.   Special note: Every effort is made to have your procedure done on time. Please understand that  emergencies sometimes delay scheduled procedures.  2. Diet: Do not eat solid foods after midnight.  The patient may have clear liquids until 5am upon the day of the procedure.  3. Labs: You had labs drawn today  4. Medication instructions in preparation for your procedure:   Contrast Allergy: No   Stop taking, Lasix (Furosemide)  Thursday, March 12,  On the morning of your procedure you may any morning medicines NOT listed above.  You may use sips of water.  5. Plan for one night stay--bring personal belongings. 6. Bring a current list of your medications and current insurance cards. 7. You MUST have a responsible person to drive you home. 8. Someone MUST be with you the first 24 hours after you arrive home or your discharge will be delayed. 9. Please wear clothes that are easy to get on and off and wear slip-on shoes.  Thank you for allowing [place] to care for you!   -- [place]

## 2018-10-03 NOTE — H&P (View-Only) (Signed)
PCP: Johny Blamer, MD  EP: Dr. Graciela Husbands HF Cardiology: Dr. Shirlee Latch  58 y.o. with history of CHF and VT was referred by Dr. Graciela Husbands for evaluation of CHF.  She has a long history of cardiomyopathy, dating back to at least 2013.  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  She has had VT in the past terminated by [employer].  She has also had up to 22% PVCs in the past, down to 3.1% on 10/19 holter. She is currently on flecainide to suppress the PVCs.  She has never had a cardiac cath, was recommended in the past but she refused.  Most recent echo in 12/19 showed severe dilation of the LV with EF 25-30%.   She has been gradually more short of breath over the last year. Currently, short of breath walking around the block or walking about 50 yards.  She has a nocturnal cough and props up in bed to prevent this.  She is short of breath walking up 1 flight of stairs.  No chest pain.  Parents had CAD but not CHF. She feels like she is slowly getting worse symptomatically.   St Jude device interrogation: No AF, no VT, 92% BiV pacing, thoracic impedance stable.   ECG (10/19, personally reviewed): NSR, BiV paced  Labs (10/19): K 4.3, creatinine 0.88  PMH:  1. Chronic systolic CHF: Uncertain etiology. St Jude BiV ICD.  - Echo (8/13): EF 40-45% - Echo (12/15): EF 25-30% - Echo (6/17): EF 35% - Echo (6/18): EF 40-45% - Echo (11/18): EF 35-40% - Echo (12/19): EF 25-30%, severe LV dilation, mild MR.  2. PVCs/VT:  - 6/18 holter 22% PVCs - 10/19 holter 3.1% PVCs 3. OSA 4. Hyperlipidemia 5. Obesity  FH: Parents with CAD/PCI.  Siblings without cardiac disease.   SH: Nonsmoker, rare ETOH, lives alone in [place], works for [employer] from home.   Current Outpatient Medications  Medication Sig Dispense Refill  . bisoprolol (ZEBETA) 5 MG tablet Take 1 tablet (5 mg total) by mouth daily. 90 tablet 3  . citalopram (CELEXA) 40 MG tablet Take 40 mg by mouth daily.     Marland Kitchen eplerenone (INSPRA) 25 MG tablet Take 1 tablet (25 mg  total) by mouth daily. 90 tablet 1  . flecainide (TAMBOCOR) 50 MG tablet TAKE 1 TABLET BY MOUTH TWICE A DAY 180 tablet 3  . furosemide (LASIX) 20 MG tablet Take 20 mg by mouth 2 times daily at 12 noon and 4 pm.    . pantoprazole (PROTONIX) 40 MG tablet Take 40 mg by mouth 2 (two) times daily.     . sacubitril-valsartan (ENTRESTO) 97-103 MG Take 1 tablet by mouth 2 (two) times daily. 180 tablet 3   No current facility-administered medications for this encounter.    BP 132/70   Pulse 70   Wt 103.4 kg (228 lb)   SpO2 94%   BMI 41.70 kg/m  General: NAD, obese.  Neck: No JVD, no thyromegaly or thyroid nodule.  Lungs: Clear to auscultation bilaterally with normal respiratory effort. CV: Nondisplaced PMI.  Heart regular S1/S2, no S3/S4, no murmur.  No peripheral edema.  No carotid bruit.  Normal pedal pulses.  Abdomen: Soft, nontender, no hepatosplenomegaly, no distention.  Skin: Intact without lesions or rashes.  Neurologic: Alert and oriented x 3.  Psych: Normal affect. Extremities: No clubbing or cyanosis.  HEENT: Normal.   Assessment/Plan: 1. Chronic systolic CHF: Long history of CHF, uncertain etiology. Refused cath in the past.  Most recent echo  in 10/19 with severe LV dilation and EF 25-30%.  Has had frequent PVCs in the past, but most recent holter in 10/19 showed PVC count down to 3.1% on flecainide.  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 NYHA class II-III.  She is not volume overloaded on exam.  - Continue bisoprolol 5 mg daily.  - Continue Entresto 97/103 bid.  - Increase eplerenone to 25 mg daily with BMET today and in 10 days.  - I will arrange for CPX.  - We discussed right/left heart cath to assess cardiac output, filling pressures, and rule out coronary disease as a cause for her CMP.  We discussed risks/benefits and she agrees to procedure.   - If no significant CAD on cath, will arrange cardiac MRI.  - I think she would benefit from cardiac MRI, will refer.  - She is interested  in the ANTHEM study.  2. PVCs: Count down to 3.1% with holter in 10/19 but EF remains 25-30% by echo. Suspect not primarily a PVC-mediated CMP therefore.  PVCs likely are a consequence of the cardiomyopathy.  - She is on flecainide for PVC suppression as well as bisoprolol.  Not ideal with structure heart disease but probably ok if no coronary disease on cath.  3. Obesity: We discussed diet/exercise to help with weight loss.   Marca Ancona 10/03/2018

## 2018-10-03 NOTE — Progress Notes (Signed)
PCP: Harris, William, MD  EP: Dr. Klein HF Cardiology: Dr. Lenita Peregrina  57 y.o. with history of CHF and VT was referred by Dr. Klein for evaluation of CHF.  She has a long history of cardiomyopathy, dating back to at least 2013.  She has a St Jude CRT-D device.  She has had VT in the past terminated by [employer].  She has also had up to 22% PVCs in the past, down to 3.1% on 10/19 holter. She is currently on flecainide to suppress the PVCs.  She has never had a cardiac cath, was recommended in the past but she refused.  Most recent echo in 12/19 showed severe dilation of the LV with EF 25-30%.   She has been gradually more short of breath over the last year. Currently, short of breath walking around the block or walking about 50 yards.  She has a nocturnal cough and props up in bed to prevent this.  She is short of breath walking up 1 flight of stairs.  No chest pain.  Parents had CAD but not CHF. She feels like she is slowly getting worse symptomatically.   St Jude device interrogation: No AF, no VT, 92% BiV pacing, thoracic impedance stable.   ECG (10/19, personally reviewed): NSR, BiV paced  Labs (10/19): K 4.3, creatinine 0.88  PMH:  1. Chronic systolic CHF: Uncertain etiology. St Jude BiV ICD.  - Echo (8/13): EF 40-45% - Echo (12/15): EF 25-30% - Echo (6/17): EF 35% - Echo (6/18): EF 40-45% - Echo (11/18): EF 35-40% - Echo (12/19): EF 25-30%, severe LV dilation, mild MR.  2. PVCs/VT:  - 6/18 holter 22% PVCs - 10/19 holter 3.1% PVCs 3. OSA 4. Hyperlipidemia 5. Obesity  FH: Parents with CAD/PCI.  Siblings without cardiac disease.   SH: Nonsmoker, rare ETOH, lives alone in [place], works for [employer] from home.   Current Outpatient Medications  Medication Sig Dispense Refill  . bisoprolol (ZEBETA) 5 MG tablet Take 1 tablet (5 mg total) by mouth daily. 90 tablet 3  . citalopram (CELEXA) 40 MG tablet Take 40 mg by mouth daily.     . eplerenone (INSPRA) 25 MG tablet Take 1 tablet (25 mg  total) by mouth daily. 90 tablet 1  . flecainide (TAMBOCOR) 50 MG tablet TAKE 1 TABLET BY MOUTH TWICE A DAY 180 tablet 3  . furosemide (LASIX) 20 MG tablet Take 20 mg by mouth 2 times daily at 12 noon and 4 pm.    . pantoprazole (PROTONIX) 40 MG tablet Take 40 mg by mouth 2 (two) times daily.     . sacubitril-valsartan (ENTRESTO) 97-103 MG Take 1 tablet by mouth 2 (two) times daily. 180 tablet 3   No current facility-administered medications for this encounter.    BP 132/70   Pulse 70   Wt 103.4 kg (228 lb)   SpO2 94%   BMI 41.70 kg/m  General: NAD, obese.  Neck: No JVD, no thyromegaly or thyroid nodule.  Lungs: Clear to auscultation bilaterally with normal respiratory effort. CV: Nondisplaced PMI.  Heart regular S1/S2, no S3/S4, no murmur.  No peripheral edema.  No carotid bruit.  Normal pedal pulses.  Abdomen: Soft, nontender, no hepatosplenomegaly, no distention.  Skin: Intact without lesions or rashes.  Neurologic: Alert and oriented x 3.  Psych: Normal affect. Extremities: No clubbing or cyanosis.  HEENT: Normal.   Assessment/Plan: 1. Chronic systolic CHF: Long history of CHF, uncertain etiology. Refused cath in the past.  Most recent echo   in 10/19 with severe LV dilation and EF 25-30%.  Has had frequent PVCs in the past, but most recent holter in 10/19 showed PVC count down to 3.1% on flecainide.  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 NYHA class II-III.  She is not volume overloaded on exam.  - Continue bisoprolol 5 mg daily.  - Continue Entresto 97/103 bid.  - Increase eplerenone to 25 mg daily with BMET today and in 10 days.  - I will arrange for CPX.  - We discussed right/left heart cath to assess cardiac output, filling pressures, and rule out coronary disease as a cause for her CMP.  We discussed risks/benefits and she agrees to procedure.   - If no significant CAD on cath, will arrange cardiac MRI.  - I think she would benefit from cardiac MRI, will refer.  - She is interested  in the ANTHEM study.  2. PVCs: Count down to 3.1% with holter in 10/19 but EF remains 25-30% by echo. Suspect not primarily a PVC-mediated CMP therefore.  PVCs likely are a consequence of the cardiomyopathy.  - She is on flecainide for PVC suppression as well as bisoprolol.  Not ideal with structure heart disease but probably ok if no coronary disease on cath.  3. Obesity: We discussed diet/exercise to help with weight loss.   Marca Ancona 10/03/2018

## 2018-10-04 ENCOUNTER — Ambulatory Visit (INDEPENDENT_AMBULATORY_CARE_PROVIDER_SITE_OTHER): Payer: BLUE CROSS/BLUE SHIELD

## 2018-10-04 DIAGNOSIS — Z95810 Presence of automatic (implantable) cardiac defibrillator: Secondary | ICD-10-CM

## 2018-10-04 DIAGNOSIS — I5022 Chronic systolic (congestive) heart failure: Secondary | ICD-10-CM

## 2018-10-05 ENCOUNTER — Other Ambulatory Visit (HOSPITAL_COMMUNITY): Payer: Self-pay | Admitting: *Deleted

## 2018-10-05 ENCOUNTER — Telehealth: Payer: Self-pay

## 2018-10-05 ENCOUNTER — Ambulatory Visit (HOSPITAL_COMMUNITY): Payer: BLUE CROSS/BLUE SHIELD | Attending: Cardiology

## 2018-10-05 DIAGNOSIS — I5022 Chronic systolic (congestive) heart failure: Secondary | ICD-10-CM

## 2018-10-05 DIAGNOSIS — Z95810 Presence of automatic (implantable) cardiac defibrillator: Secondary | ICD-10-CM | POA: Insufficient documentation

## 2018-10-05 DIAGNOSIS — G4733 Obstructive sleep apnea (adult) (pediatric): Secondary | ICD-10-CM | POA: Diagnosis not present

## 2018-10-05 NOTE — Progress Notes (Signed)
EPIC Encounter for ICM Monitoring  Patient Name: Alison Howard is a 58 y.o. female Date: 10/05/2018 Primary Care Physican: Johny Blamer, MD Primary Cardiologist:[place] Electrophysiologist: Joycelyn Schmid Pacing: 92% Last Weight:218lbs Today's Weight:unknown  Attempted call to patient and unable to reach.  Transmission reviewed. Heart cath scheduled 10/07/2018.  [place]vue thoracic impedancenormal but was abnormal suggesting fluid from 09/22/2018 - 09/27/2018, 09/17/2018 - 09/20/2018.  Prescribed:Furosemide 20 mg Take 1 tablet (20 mg) by mouth scheduled in the morning, may take an additional dose in the afternoon if needed for fluid retention.  Labs: 10/01/2018 Creatinine 0.79, BUN 12, Potassium 4.9, Sodium 137, GFR >60 05/26/2018 Creatinine 0.88, BUN 9, Potassium 4.3, Sodium 143, GFR 73-84 04/08/2018 Creatinine 0.81, BUN 10, Potassium 4.4, Sodium 139, GFR 81-93 03/03/2018 Creatinine 0.86, BUN 11, Potassium 4.5, Sodium 141, GFR 75-87 02/01/2018 Creatinine 0.94, BUN 13, Potassium 4.2, Sodium 142, GFR 68-78  Recommendations:Unable to reach.  Follow-up plan: ICM clinic phone appointment on 11/08/2018.  Copy of ICM check sent to Dr. Graciela Husbands.  3 month ICM trend: 10/04/2018    1 Year ICM trend:       Karie Soda, RN 10/05/2018 9:08 AM

## 2018-10-05 NOTE — Telephone Encounter (Signed)
Remote ICM transmission received.  Attempted call to patient regarding ICM remote transmission and left detailed message, per DPR, with next ICM remote transmission date of 11/08/2018.  Advised to return call for any fluid symptoms or questions.    

## 2018-10-06 ENCOUNTER — Telehealth (HOSPITAL_COMMUNITY): Payer: Self-pay

## 2018-10-06 NOTE — Telephone Encounter (Signed)
Relayed message to pt, verbalized understanding and confirmed f/u lab appt

## 2018-10-06 NOTE — Telephone Encounter (Signed)
-----   Message from Laurey Morale, MD sent at 10/05/2018 11:06 PM EDT ----- No significant limitation from heart failure. Some deconditioning. Good result.

## 2018-10-07 ENCOUNTER — Ambulatory Visit (HOSPITAL_COMMUNITY)
Admission: RE | Admit: 2018-10-07 | Discharge: 2018-10-07 | Disposition: A | Discharge status: Home / Self Care | Payer: BLUE CROSS/BLUE SHIELD | Attending: Cardiology | Admitting: Cardiology

## 2018-10-07 ENCOUNTER — Encounter (HOSPITAL_COMMUNITY): Payer: Self-pay | Admitting: Cardiology

## 2018-10-07 ENCOUNTER — Encounter (HOSPITAL_COMMUNITY)
Admission: RE | Disposition: A | Discharge status: Home / Self Care | Payer: Self-pay | Source: Home / Self Care | Attending: Cardiology

## 2018-10-07 ENCOUNTER — Other Ambulatory Visit: Payer: Self-pay

## 2018-10-07 DIAGNOSIS — Z6841 Body Mass Index (BMI) 40.0 and over, adult: Secondary | ICD-10-CM | POA: Diagnosis not present

## 2018-10-07 DIAGNOSIS — I493 Ventricular premature depolarization: Secondary | ICD-10-CM | POA: Diagnosis not present

## 2018-10-07 DIAGNOSIS — G4733 Obstructive sleep apnea (adult) (pediatric): Secondary | ICD-10-CM | POA: Insufficient documentation

## 2018-10-07 DIAGNOSIS — E785 Hyperlipidemia, unspecified: Secondary | ICD-10-CM | POA: Insufficient documentation

## 2018-10-07 DIAGNOSIS — I429 Cardiomyopathy, unspecified: Secondary | ICD-10-CM | POA: Diagnosis not present

## 2018-10-07 DIAGNOSIS — Z79899 Other long term (current) drug therapy: Secondary | ICD-10-CM | POA: Diagnosis not present

## 2018-10-07 DIAGNOSIS — I5022 Chronic systolic (congestive) heart failure: Secondary | ICD-10-CM | POA: Insufficient documentation

## 2018-10-07 DIAGNOSIS — Z8249 Family history of ischemic heart disease and other diseases of the circulatory system: Secondary | ICD-10-CM | POA: Diagnosis not present

## 2018-10-07 DIAGNOSIS — E669 Obesity, unspecified: Secondary | ICD-10-CM | POA: Diagnosis not present

## 2018-10-07 DIAGNOSIS — R0602 Shortness of breath: Secondary | ICD-10-CM | POA: Insufficient documentation

## 2018-10-07 DIAGNOSIS — I509 Heart failure, unspecified: Secondary | ICD-10-CM | POA: Diagnosis not present

## 2018-10-07 HISTORY — PX: RIGHT/LEFT HEART CATH AND CORONARY ANGIOGRAPHY: CATH118266

## 2018-10-07 LAB — POCT I-STAT EG7
Acid-Base Excess: 1 mmol/L (ref 0.0–2.0)
Acid-Base Excess: 1 mmol/L (ref 0.0–2.0)
Bicarbonate: 26.8 mmol/L (ref 20.0–28.0)
Bicarbonate: 27.1 mmol/L (ref 20.0–28.0)
Calcium, Ion: 1.19 mmol/L (ref 1.15–1.40)
Calcium, Ion: 1.2 mmol/L (ref 1.15–1.40)
HCT: 44 % (ref 36.0–46.0)
HCT: 44 % (ref 36.0–46.0)
Hemoglobin: 15 g/dL (ref 12.0–15.0)
Hemoglobin: 15 g/dL (ref 12.0–15.0)
O2 Saturation: 67 %
O2 Saturation: 75 %
Potassium: 4.1 mmol/L (ref 3.5–5.1)
Potassium: 4.1 mmol/L (ref 3.5–5.1)
Sodium: 137 mmol/L (ref 135–145)
Sodium: 138 mmol/L (ref 135–145)
TCO2: 28 mmol/L (ref 22–32)
TCO2: 28 mmol/L (ref 22–32)
pCO2, Ven: 44.6 mmHg (ref 44.0–60.0)
pCO2, Ven: 46.3 mmHg (ref 44.0–60.0)
pH, Ven: 7.376 (ref 7.250–7.430)
pH, Ven: 7.387 (ref 7.250–7.430)
pO2, Ven: 36 mmHg (ref 32.0–45.0)
pO2, Ven: 41 mmHg (ref 32.0–45.0)

## 2018-10-07 SURGERY — RIGHT/LEFT HEART CATH AND CORONARY ANGIOGRAPHY
Anesthesia: LOCAL

## 2018-10-07 MED ORDER — HEPARIN (PORCINE) IN NACL 1000-0.9 UT/500ML-% IV SOLN
INTRAVENOUS | Status: AC
  Filled 2018-10-07: qty 500

## 2018-10-07 MED ORDER — SODIUM CHLORIDE 0.9 % IV SOLN: INTRAVENOUS | Status: DC

## 2018-10-07 MED ORDER — MIDAZOLAM HCL 2 MG/2ML IJ SOLN
INTRAMUSCULAR | Status: DC | PRN
  Administered 2018-10-07 (×2): 1 mg via INTRAVENOUS

## 2018-10-07 MED ORDER — ONDANSETRON HCL 4 MG/2ML IJ SOLN: 4.0000 mg | Freq: Four times a day (QID) | INTRAMUSCULAR | Status: DC | PRN

## 2018-10-07 MED ORDER — ASPIRIN 81 MG PO CHEW: 81.0000 mg | CHEWABLE_TABLET | ORAL | Status: DC

## 2018-10-07 MED ORDER — FENTANYL CITRATE (PF) 100 MCG/2ML IJ SOLN
INTRAMUSCULAR | Status: AC
  Filled 2018-10-07: qty 2

## 2018-10-07 MED ORDER — MIDAZOLAM HCL 2 MG/2ML IJ SOLN
INTRAMUSCULAR | Status: AC
  Filled 2018-10-07: qty 2

## 2018-10-07 MED ORDER — HEPARIN (PORCINE) IN NACL 1000-0.9 UT/500ML-% IV SOLN
INTRAVENOUS | Status: DC | PRN
  Administered 2018-10-07 (×2): 500 mL

## 2018-10-07 MED ORDER — SODIUM CHLORIDE 0.9 % IV SOLN: 250.0000 mL | INTRAVENOUS | Status: DC | PRN

## 2018-10-07 MED ORDER — ASPIRIN 81 MG PO CHEW
CHEWABLE_TABLET | ORAL | Status: AC
  Administered 2018-10-07: 81 mg
  Filled 2018-10-07: qty 1

## 2018-10-07 MED ORDER — HEPARIN SODIUM (PORCINE) 1000 UNIT/ML IJ SOLN
INTRAMUSCULAR | Status: DC | PRN
  Administered 2018-10-07: 4500 [IU] via INTRAVENOUS

## 2018-10-07 MED ORDER — VERAPAMIL HCL 2.5 MG/ML IV SOLN
INTRAVENOUS | Status: AC
  Filled 2018-10-07: qty 2

## 2018-10-07 MED ORDER — LIDOCAINE HCL (PF) 1 % IJ SOLN
INTRAMUSCULAR | Status: DC | PRN
  Administered 2018-10-07 (×2): 2 mL

## 2018-10-07 MED ORDER — SODIUM CHLORIDE 0.9% FLUSH: 3.0000 mL | Freq: Two times a day (BID) | INTRAVENOUS | Status: DC

## 2018-10-07 MED ORDER — SODIUM CHLORIDE 0.9% FLUSH: 3.0000 mL | INTRAVENOUS | Status: DC | PRN

## 2018-10-07 MED ORDER — VERAPAMIL HCL 2.5 MG/ML IV SOLN
INTRAVENOUS | Status: DC | PRN
  Administered 2018-10-07: 10 mL via INTRA_ARTERIAL

## 2018-10-07 MED ORDER — ACETAMINOPHEN 325 MG PO TABS: 650.0000 mg | ORAL_TABLET | ORAL | Status: DC | PRN

## 2018-10-07 MED ORDER — LIDOCAINE HCL (PF) 1 % IJ SOLN
INTRAMUSCULAR | Status: AC
  Filled 2018-10-07: qty 30

## 2018-10-07 MED ORDER — FENTANYL CITRATE (PF) 100 MCG/2ML IJ SOLN
INTRAMUSCULAR | Status: DC | PRN
  Administered 2018-10-07 (×2): 25 ug via INTRAVENOUS

## 2018-10-07 MED ORDER — SODIUM CHLORIDE 0.9 % IV SOLN
INTRAVENOUS | Status: DC
  Administered 2018-10-07: 10:00:00 via INTRAVENOUS

## 2018-10-07 MED ORDER — IOHEXOL 350 MG/ML SOLN
INTRAVENOUS | Status: DC | PRN
  Administered 2018-10-07: 30 mL via INTRACARDIAC

## 2018-10-07 SURGICAL SUPPLY — 11 items

## 2018-10-07 NOTE — Interval H&P Note (Signed)
History and Physical Interval Note:  10/07/2018 10:34 AM  Alison Howard  has presented today for surgery, with the diagnosis of Chronic Heart Failure.  The various methods of treatment have been discussed with the patient and family. After consideration of risks, benefits and other options for treatment, the patient has consented to  Procedure(s): RIGHT/LEFT HEART CATH AND CORONARY ANGIOGRAPHY (N/A) as a surgical intervention.  The patient's history has been reviewed, patient examined, no change in status, stable for surgery.  I have reviewed the patient's chart and labs.  Questions were answered to the patient's satisfaction.     Jaiah Weigel [employer]

## 2018-10-07 NOTE — Discharge Instructions (Signed)
Drink plenty of water over next 48 hours and keep right wrist elevated at heart level for 24 hours   Radial Site Care  This sheet gives you information about how to care for yourself after your procedure. Your health care provider may also give you more specific instructions. If you have problems or questions, contact your health care provider. What can I expect after the procedure? After the procedure, it is common to have:  Bruising and tenderness at the catheter insertion area. Follow these instructions at home: Medicines  Take over-the-counter and prescription medicines only as told by your health care provider. Insertion site care  Follow instructions from your health care provider about how to take care of your insertion site. Make sure you: ? Wash your hands with soap and water before you change your bandage (dressing). If soap and water are not available, use hand sanitizer. ? Remove your dressing as told by your health care provider. In 24-48 hours  Check your insertion site every day for signs of infection. Check for: ? Redness, swelling, or pain. ? Fluid or blood. ? Pus or a bad smell. ? Warmth.  Do not take baths, swim, or use a hot tub until your health care provider approves.  You may shower 24-48 hours after the procedure, or as directed by your health care provider. ? Remove the dressing and gently wash the site with plain soap and water. ? Pat the area dry with a clean towel. ? Do not rub the site. That could cause bleeding.  Do not apply powder or lotion to the site. Activity   For 24 hours after the procedure, or as directed by your health care provider: ? Do not flex or bend the affected arm. ? Do not push or pull heavy objects with the affected arm. ? Do not drive yourself home from the hospital or clinic. You may drive 24 hours after the procedure unless your health care provider tells you not to. ? Do not operate machinery or power tools.  Do not lift  anything that is heavier than 10 lb (4.5 kg), or the limit that you are told, until your health care provider says that it is safe. For 5 days  Ask your health care provider when it is okay to: ? Return to work or school. ? Resume usual physical activities or sports. ? Resume sexual activity. General instructions  If the catheter site starts to bleed, raise your arm and put firm pressure on the site. If the bleeding does not stop, get help right away. This is a medical emergency.  If you went home on the same day as your procedure, a responsible adult should be with you for the first 24 hours after you arrive home.  Keep all follow-up visits as told by your health care provider. This is important. Contact a health care provider if:  You have a fever.  You have redness, swelling, or yellow drainage around your insertion site. Get help right away if:  You have unusual pain at the radial site.  The catheter insertion area swells very fast.  The insertion area is bleeding, and the bleeding does not stop when you hold steady pressure on the area.  Your arm or hand becomes pale, cool, tingly, or numb. These symptoms may represent a serious problem that is an emergency. Do not wait to see if the symptoms will go away. Get medical help right away. Call your local emergency services (911 in the [place] Do  not drive yourself to the hospital. Summary  After the procedure, it is common to have bruising and tenderness at the site.  Follow instructions from your health care provider about how to take care of your radial site wound. Check the wound every day for signs of infection.  Do not lift anything that is heavier than 10 lb (4.5 kg), or the limit that you are told, until your health care provider says that it is safe. This information is not intended to replace advice given to you by your health care provider. Make sure you discuss any questions you have with your health care  provider. Document Released: 08/16/2010 Document Revised: 08/19/2017 Document Reviewed: 08/19/2017 Elsevier Interactive Patient Education  2019 [employer].

## 2018-10-11 ENCOUNTER — Other Ambulatory Visit: Payer: Self-pay

## 2018-10-11 ENCOUNTER — Ambulatory Visit (HOSPITAL_COMMUNITY)
Admission: RE | Admit: 2018-10-11 | Discharge: 2018-10-11 | Disposition: A | Discharge status: Home / Self Care | Payer: BLUE CROSS/BLUE SHIELD | Source: Ambulatory Visit | Attending: Internal Medicine | Admitting: Internal Medicine

## 2018-10-11 DIAGNOSIS — I5022 Chronic systolic (congestive) heart failure: Secondary | ICD-10-CM | POA: Insufficient documentation

## 2018-10-11 LAB — BASIC METABOLIC PANEL
Anion gap: 6 (ref 5–15)
BUN: 12 mg/dL (ref 6–20)
CO2: 28 mmol/L (ref 22–32)
Calcium: 9.1 mg/dL (ref 8.9–10.3)
Chloride: 103 mmol/L (ref 98–111)
Creatinine, Ser: 0.88 mg/dL (ref 0.44–1.00)
GFR calc Af Amer: >60 mL/min (ref >60)
GFR calc non Af Amer: >60 mL/min (ref >60)
Glucose, Bld: 102 mg/dL — ABNORMAL HIGH (ref 70–99)
Potassium: 4.3 mmol/L (ref 3.5–5.1)
Sodium: 137 mmol/L (ref 135–145)

## 2018-10-13 ENCOUNTER — Telehealth (HOSPITAL_COMMUNITY): Payer: Self-pay

## 2018-10-13 NOTE — Telephone Encounter (Signed)
Attempted to call patient in regards to Cardiac Rehab - LM on VM °Gloria W. Support Rep II °

## 2018-10-13 NOTE — Telephone Encounter (Signed)
Pt insurance is active and benefits verified through [employer] Co-pay 0, DED $500/$500 met, out of pocket $2,000/$554.10 met, co-insurance 0%. no pre-authorization required. Passport, 10/13/2018 @ 2:12pm, REF# [id number]  Will contact patient to see if she is interested in the Cardiac Rehab Program. If interested, patient will need to complete follow up appt. Once completed, patient will be contacted for scheduling upon review by the RN Navigator. Tedra [place]. Support Rep II

## 2018-10-20 ENCOUNTER — Other Ambulatory Visit: Payer: Self-pay | Admitting: Nurse Practitioner

## 2018-10-21 ENCOUNTER — Ambulatory Visit (HOSPITAL_COMMUNITY)
Admission: RE | Admit: 2018-10-21 | Discharge: 2018-10-21 | Disposition: A | Discharge status: Home / Self Care | Payer: BLUE CROSS/BLUE SHIELD | Source: Ambulatory Visit | Attending: Cardiology | Admitting: Cardiology

## 2018-10-21 ENCOUNTER — Other Ambulatory Visit: Payer: Self-pay

## 2018-10-21 ENCOUNTER — Encounter (HOSPITAL_COMMUNITY): Payer: Self-pay | Admitting: *Deleted

## 2018-10-21 DIAGNOSIS — I5022 Chronic systolic (congestive) heart failure: Secondary | ICD-10-CM | POA: Diagnosis not present

## 2018-10-21 MED ORDER — BISOPROLOL FUMARATE 5 MG PO TABS: 5.0000 mg | ORAL_TABLET | Freq: Two times a day (BID) | ORAL | 3 refills | Status: DC

## 2018-10-21 NOTE — Progress Notes (Signed)
Instructions sent to patient via mychart

## 2018-10-21 NOTE — Patient Instructions (Addendum)
Increase Bisoprolol to 5 mg Twice daily (take the evening dose close to bedtime), a new prescription for this has been sent to [facility]  Please check your Blood Pressure daily for 2 weeks and send [place] a mychart message or call our office with the readings.  Your physician has requested that you have a cardiac MRI. Cardiac MRI uses a computer to create images of your heart as its beating, producing both still and moving pictures of your heart and major blood vessels. For further information please visit [email or web address]. Please follow the instruction sheet given to you today for more information.  WE WILL GET THIS APPROVED WITH YOUR INSURANCE COMPANY AND THEN YOU WILL BE CALLED TO GET IT SCHEDULED, HOWEVER THEY ARE NOT SCHEDULING AT THIS TIME DUE TO COVID-19 SO IT MAY BE A MONTH OR SO BEFORE YOU ARE CALLED  Your physician recommends that you schedule a follow-up appointment in: 2 months

## 2018-10-21 NOTE — Progress Notes (Signed)
Heart Failure TeleHealth Note  Due to national recommendations of social distancing due to COVID 19, Audio/video telehealth visit is felt to be most appropriate for this patient at this time.  See MyChart message from today for patient consent regarding telehealth for Alison Howard.  Date:  10/21/2018   ID:  Alison Howard, DOB 1960/08/31, MRN [id number]  Location: Home  Provider location: [address], [place] Type of Visit: Established patient  PCP:  Johny Blamer, MD  Cardiologist:  No primary care provider on file. Primary HF: Dr. Shirlee Latch  Chief Complaint:  Mild fatigue   History of Present Illness: Alison Howard is a 58 y.o. female who presents via audio/video conferencing for a telehealth visit today.     Today,  she denies symptoms of cough, fevers, chills, or new SOB worrisome for COVID 19.    Patient has history of CHF and VT was referred by Dr. Graciela Husbands for evaluation of CHF. She has a long history of cardiomyopathy, dating back to at least 2013.  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  She has had VT in the past terminated by [employer].  She has also had up to 22% PVCs in the past, down to 3.1% on 10/19 holter. She is currently on flecainide to suppress the PVCs.  She has never had a cardiac cath, was recommended in the past but she refused.  Most recent echo in 12/19 showed severe dilation of the LV with EF 25-30%.   RHC/LHC in 3/20 showed normal filling pressures, CI 2.37, and no significant CAD. CPX in 3/20 showed no significant HF limitation, main problem appeared to be deconditioning.   Labs (10/19): K 4.3, creatinine 0.88 Labs (3/20): K 4.3, creatinine 0.88  PMH:  1. Chronic systolic CHF: Nonischemic cardiomyopathy. St Jude BiV ICD.  - Echo (8/13): EF 40-45% - Echo (12/15): EF 25-30% - Echo (6/17): EF 35% - Echo (6/18): EF 40-45% - Echo (11/18): EF 35-40% - Echo (12/19): EF 25-30%, severe LV dilation, mild MR.  - RHC/LHC (3/20): no CAD.  Mean RA 4, PA 27/10 mean  16, mean PCWP 5, CI 2.37.  - CPX (3/20): peak VO2 16.9 (97% predicted), VE/VCO2 23, RER 1.14 => no significant HF limitation, primarily limited by deconditioning.  2. PVCs/VT:  - 6/18 holter 22% PVCs - 10/19 holter 3.1% PVCs 3. OSA 4. Hyperlipidemia 5. Obesity  Current Outpatient Medications  Medication Sig Dispense Refill  . bisoprolol (ZEBETA) 5 MG tablet Take 1 tablet (5 mg total) by mouth 2 (two) times daily. 180 tablet 3  . citalopram (CELEXA) 40 MG tablet Take 40 mg by mouth daily.     Marland Kitchen eplerenone (INSPRA) 25 MG tablet Take 1 tablet (25 mg total) by mouth daily. 90 tablet 1  . flecainide (TAMBOCOR) 50 MG tablet TAKE 1 TABLET BY MOUTH TWICE A DAY (Patient taking differently: Take 50 mg by mouth 2 (two) times daily. ) 180 tablet 3  . furosemide (LASIX) 20 MG tablet Take 20 mg by mouth See admin instructions. Take 1 tablet (20 mg) by mouth scheduled in the morning, may take an additional dose in the afternoon if needed for fluid retention.    . pantoprazole (PROTONIX) 40 MG tablet Take 40 mg by mouth 2 (two) times daily.     . sacubitril-valsartan (ENTRESTO) 97-103 MG Take 1 tablet by mouth 2 (two) times daily. 180 tablet 3   No current facility-administered medications for this encounter.  Allergies:   Ciprofloxacin hcl; Codeine; Morphine and related; and Sulfa antibiotics   Social History:  The patient  reports that she has never smoked. She has never used smokeless tobacco. She reports current alcohol use. She reports that she does not use drugs.   Family History:  The patient's family history includes Coronary artery disease in her mother; Heart attack in her mother; Heart disease in her father; Hyperlipidemia in her sister.   ROS:  Please see the history of present illness.   All other systems are personally reviewed and negative.   Exam:  [facility] Call; Exam is subjective and or/visual.) General:  No resp difficulty. Lungs: Normal respiratory effort with conversation.   Abdomen: Non-distended. Pt denies tenderness with self palpation.  Extremities: Pt denies edema. Neuro: Alert & oriented x 3.   Recent Labs: 10/01/2018: Platelets 364 10/07/2018: Hemoglobin 15.0; Hemoglobin 15.0 10/11/2018: BUN 12; Creatinine, Ser 0.88; Potassium 4.3; Sodium 137  Personally reviewed   Wt Readings from Last 3 Encounters:  10/07/18 102.1 kg (225 lb)  10/01/18 103.4 kg (228 lb)  05/26/18 104 kg (229 lb 3.2 oz)     ASSESSMENT AND PLAN:  1. Chronic systolic CHF: Long history of nonischemic cardiomyopathy.  Most recent echo in 10/19 with severe LV dilation and EF 25-30%.  Has had frequent PVCs in the past, but most recent holter in 10/19 showed PVC count down to 3.1% on flecainide.  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 RHC/LHC in 3/20 with no significant CAD, normal feeling pressures, and preserved cardiac output.  CPX showed deconditioning but no significant HF limitation. NYHA class II symptoms.  - Increase bisoprolol to 5 mg bid.  I will have her check BP once daily and call in readings in 2 wks to make sure BP does not get too low.  - Continue Entresto 97/103 bid.  - Continue eplerenone 25 mg daily.  - Continue Lasix 20 mg daily.   - I will arrange for cardiac MRI to look for evidence of infiltrative disease or myocarditis.   - She is interested in the ANTHEM study but probably is not symptomatic enough to qualify.  2. PVCs: Count down to 3.1% with holter in 10/19 but EF remains 25-30% by echo. Suspect not primarily a PVC-mediated CMP therefore.  PVCs likely are a consequence of the cardiomyopathy.  - She is on flecainide for PVC suppression as well as bisoprolol.  Not ideal with structure heart disease but probably ok if no coronary disease on cath.  3. Obesity: We discussed diet/exercise to help with weight loss.  4. Deconditioning: I would like her to do cardiac rehab when it re-opens.   COVID screen The patient does not have any symptoms that suggest any further testing/  screening at this time.  Social distancing reinforced today.  Recommended follow-up:  2 months  Relevant cardiac medications were reviewed at length with the patient today.   The patient does not have concerns regarding their medications at this time.   Patient Risk: After full review of this patients clinical status, I feel that they are at moderate risk for cardiac decompensation at this time.  Today, I have spent 21 minutes with the patient with telehealth technology discussing CHF .    Signed, Marca Ancona, MD  10/21/2018   [facility] [address] [facility] [place] [address] [phone] (office) [phone] (fax)

## 2018-11-02 ENCOUNTER — Other Ambulatory Visit: Payer: Self-pay | Admitting: Internal Medicine

## 2018-11-02 MED ORDER — SACUBITRIL-VALSARTAN 97-103 MG PO TABS: 1.0000 | ORAL_TABLET | Freq: Two times a day (BID) | ORAL | 1 refills | Status: DC

## 2018-11-08 ENCOUNTER — Other Ambulatory Visit: Payer: Self-pay

## 2018-11-08 ENCOUNTER — Ambulatory Visit (INDEPENDENT_AMBULATORY_CARE_PROVIDER_SITE_OTHER): Payer: BLUE CROSS/BLUE SHIELD

## 2018-11-08 DIAGNOSIS — I5022 Chronic systolic (congestive) heart failure: Secondary | ICD-10-CM

## 2018-11-08 DIAGNOSIS — Z95810 Presence of automatic (implantable) cardiac defibrillator: Secondary | ICD-10-CM | POA: Diagnosis not present

## 2018-11-09 ENCOUNTER — Encounter (HOSPITAL_COMMUNITY): Payer: Self-pay

## 2018-11-10 ENCOUNTER — Telehealth: Payer: Self-pay

## 2018-11-10 NOTE — Progress Notes (Signed)
EPIC Encounter for ICM Monitoring  Patient Name: Alison Howard is a 58 y.o. female Date: 11/10/2018 Primary Care Physican: Johny Blamer, MD Primary Cardiologist:[facility] Electrophysiologist: Joycelyn Schmid Pacing: 92% Last Weight:218lbs 11/10/2018 Weight:unknown  Attempted call to patient and unable to reach. Transmission reviewed.   Corvue thoracic impedancenormalbut was abnormal suggesting fluid from 10/18/2018 - 10/28/2018.  Prescribed:Furosemide 20 mg Take 1 tablet (20 mg) by mouth scheduled in the morning, may take an additional dose in the afternoon if needed for fluid retention.  Labs: 10/11/2018 Creatinine 0.88, BUN 12, Potassium 4.3, Sodium 137, GFR >60 10/01/2018 Creatinine 0.79, BUN 12, Potassium 4.9, Sodium 137, GFR >60  Recommendations:Unable to reach.  Follow-up plan: ICM clinic phone appointment on5/18/2020.  Copy of ICM check sent to Dr. Graciela Husbands.  3 month ICM trend: 11/08/2018    1 Year ICM trend:       Karie Soda, RN 11/10/2018 10:07 AM

## 2018-11-10 NOTE — Telephone Encounter (Signed)
Dr Lucienne Minks you like to make any changes based on these b/p reading?

## 2018-11-10 NOTE — Telephone Encounter (Signed)
Remote ICM transmission received.  Attempted call to patient regarding ICM remote transmission and left detailed message, per DPR, with next ICM remote transmission date of 12/13/2018.  Advised to return call for any fluid symptoms or questions.    

## 2018-11-29 ENCOUNTER — Telehealth: Payer: Self-pay | Admitting: Internal Medicine

## 2018-11-29 NOTE — Telephone Encounter (Signed)
New message   Pt scheduled for virtual video visit with Dr. Graciela Husbands on 05.08.20. Pt aware he will use [email or web address]. Pt smart phone number and email address are listed in the appt notes.      Virtual Visit Pre-Appointment Phone Call  "(Name), I am calling you today to discuss your upcoming appointment. We are currently trying to limit exposure to the virus that causes COVID-19 by seeing patients at home rather than in the office."  1. "What is the BEST phone number to call the day of the visit?" - include this in appointment notes  2. Do you have or have access to (through a family member/friend) a smartphone with video capability that we can use for your visit?" a. If yes - list this number in appt notes as cell (if different from BEST phone #) and list the appointment type as a VIDEO visit in appointment notes b. If no - list the appointment type as a PHONE visit in appointment notes  3. Confirm consent - "In the setting of the current Covid19 crisis, you are scheduled for a (phone or video) visit with your provider on (date) at (time).  Just as we do with many in-office visits, in order for you to participate in this visit, we must obtain consent.  If you'd like, I can send this to your mychart (if signed up) or email for you to review.  Otherwise, I can obtain your verbal consent now.  All virtual visits are billed to your insurance company just like a normal visit would be.  By agreeing to a virtual visit, we'd like you to understand that the technology does not allow for your provider to perform an examination, and thus may limit your provider's ability to fully assess your condition. If your provider identifies any concerns that need to be evaluated in person, we will make arrangements to do so.  Finally, though the technology is pretty good, we cannot assure that it will always work on either your or our end, and in the setting of a video visit, we may have to convert it to a phone-only visit.   In either situation, we cannot ensure that we have a secure connection.  Are you willing to proceed?" STAFF: Did the patient verbally acknowledge consent to telehealth visit? Document YES/NO here: YES  4. Advise patient to be prepared - "Two hours prior to your appointment, go ahead and check your blood pressure, pulse, oxygen saturation, and your weight (if you have the equipment to check those) and write them all down. When your visit starts, your provider will ask you for this information. If you have an Apple Watch or Kardia device, please plan to have heart rate information ready on the day of your appointment. Please have a pen and paper handy nearby the day of the visit as well."  5. Give patient instructions for MyChart download to smartphone OR Doximity/Doxy.me as below if video visit (depending on what platform provider is using)  6. Inform patient they will receive a phone call 15 minutes prior to their appointment time (may be from unknown caller ID) so they should be prepared to answer    TELEPHONE CALL NOTE  Alison Howard has been deemed a candidate for a follow-up tele-health visit to limit community exposure during the Covid-19 pandemic. I spoke with the patient via phone to ensure availability of phone/video source, confirm preferred email & phone number, and discuss instructions and expectations.  I reminded Alison Howard  to be prepared with any vital sign and/or heart rhythm information that could potentially be obtained via home monitoring, at the time of her visit. I reminded Alison Howard to expect a phone call prior to her visit.  Alison Howard 11/29/2018 1:25 PM   INSTRUCTIONS FOR DOWNLOADING THE MYCHART APP TO SMARTPHONE  - The patient must first make sure to have activated MyChart and know their login information - If Apple, go to [employer] and type in MyChart in the search bar and download the app. If Android, ask patient to go to [employer] and type in [facility] in  the search bar and download the app. The app is free but as with any other app downloads, their phone may require them to verify saved payment information or Apple/Android password.  - The patient will need to then log into the app with their MyChart username and password, and select [facility] as their healthcare provider to link the account. When it is time for your visit, go to the MyChart app, find appointments, and click Begin Video Visit. Be sure to Select Allow for your device to access the Microphone and Camera for your visit. You will then be connected, and your provider will be with you shortly.  **If they have any issues connecting, or need assistance please contact MyChart service desk [phone])**  **If using a computer, in order to ensure the best quality for their visit they will need to use either of the following Internet Browsers: [employer] or Google Chrome**  IF USING DOXIMITY or DOXY.ME - The patient will receive a link just prior to their visit by text.     FULL LENGTH CONSENT FOR TELE-HEALTH VISIT   I hereby voluntarily request, consent and authorize [facility] and its employed or contracted physicians, physician assistants, nurse practitioners or other licensed health care professionals (the Practitioner), to provide me with telemedicine health care services (the Services") as deemed necessary by the treating Practitioner. I acknowledge and consent to receive the Services by the Practitioner via telemedicine. I understand that the telemedicine visit will involve communicating with the Practitioner through live audiovisual communication technology and the disclosure of certain medical information by electronic transmission. I acknowledge that I have been given the opportunity to request an in-person assessment or other available alternative prior to the telemedicine visit and am voluntarily participating in the telemedicine visit.  I understand that  I have the right to withhold or withdraw my consent to the use of telemedicine in the course of my care at any time, without affecting my right to future care or treatment, and that the Practitioner or I may terminate the telemedicine visit at any time. I understand that I have the right to inspect all information obtained and/or recorded in the course of the telemedicine visit and may receive copies of available information for a reasonable fee.  I understand that some of the potential risks of receiving the Services via telemedicine include:   Delay or interruption in medical evaluation due to technological equipment failure or disruption;  Information transmitted may not be sufficient (e.g. poor resolution of images) to allow for appropriate medical decision making by the Practitioner; and/or   In rare instances, security protocols could fail, causing a breach of personal health information.  Furthermore, I acknowledge that it is my responsibility to provide information about my medical history, conditions and care that is complete and accurate to the best of my ability. I acknowledge that  Practitioner's advice, recommendations, and/or decision may be based on factors not within their control, such as incomplete or inaccurate data provided by me or distortions of diagnostic images or specimens that may result from electronic transmissions. I understand that the practice of medicine is not an exact science and that Practitioner makes no warranties or guarantees regarding treatment outcomes. I acknowledge that I will receive a copy of this consent concurrently upon execution via email to the email address I last provided but may also request a printed copy by calling the office of [facility].    I understand that my insurance will be billed for this visit.   I have read or had this consent read to me.  I understand the contents of this consent, which adequately explains the benefits and risks of  the Services being provided via telemedicine.   I have been provided ample opportunity to ask questions regarding this consent and the Services and have had my questions answered to my satisfaction.  I give my informed consent for the services to be provided through the use of telemedicine in my medical care  By participating in this telemedicine visit I agree to the above.

## 2018-12-03 ENCOUNTER — Encounter: Payer: Self-pay | Admitting: Internal Medicine

## 2018-12-03 ENCOUNTER — Other Ambulatory Visit: Payer: Self-pay

## 2018-12-03 ENCOUNTER — Telehealth (HOSPITAL_COMMUNITY): Payer: Self-pay

## 2018-12-03 ENCOUNTER — Telehealth (INDEPENDENT_AMBULATORY_CARE_PROVIDER_SITE_OTHER): Payer: BLUE CROSS/BLUE SHIELD | Admitting: Internal Medicine

## 2018-12-03 VITALS — BP 125/85 | HR 67 | Ht 62.0 in | Wt 223.0 lb

## 2018-12-03 DIAGNOSIS — I428 Other cardiomyopathies: Secondary | ICD-10-CM

## 2018-12-03 DIAGNOSIS — I493 Ventricular premature depolarization: Secondary | ICD-10-CM

## 2018-12-03 DIAGNOSIS — I5043 Acute on chronic combined systolic (congestive) and diastolic (congestive) heart failure: Secondary | ICD-10-CM

## 2018-12-03 DIAGNOSIS — Z95810 Presence of automatic (implantable) cardiac defibrillator: Secondary | ICD-10-CM

## 2018-12-03 DIAGNOSIS — I5022 Chronic systolic (congestive) heart failure: Secondary | ICD-10-CM

## 2018-12-03 NOTE — Progress Notes (Signed)
Electrophysiology TeleHealth Note   Due to national recommendations of social distancing due to COVID 19, an audio/video telehealth visit is felt to be most appropriate for this patient at this time.  See MyChart message from today for the patient's consent to telehealth for [facility].   Date:  12/03/2018   ID:  Alison Howard, DOB 02/14/1961, MRN [id number]  Location: patient's home  Provider location: [address], [place]  Evaluation Performed: Follow-up visit  PCP:  Johny Blamer, MD  Cardiologist:  DM Electrophysiologist:  SK   Chief Complaint: CHF PVCs  History of Present Illness:    Alison Howard is a 58 y.o. female who presents via audio/video conferencing for a telehealth visit today.  Since last being seen in our clinic for CRT-D implanted for nonischemic cardiomyopathy, the patient reports  History of appropriate VT therapy 6/15.  Has been seen by the heart failure clinic at my request; medications adjusted.  Catheterization as below.  MRI anticipated.  Without chest pain shortness of breath or edema. No palps or ICD therapies  Exercising by walking her dog 5-10 minutes 3 times a day.  DATE TEST EF   8/13  Echo  40-45%   12/15    Echo   25-30 %   6/17    Echo   35%   6/18 Echo   40-45%   11/18 Echo  35-40%   12/19 Echo 25-30%   3/20 R/LHC  CAs without obstruction     DATE PVC %   8/17 11 Multiple morphologies  6/18 22  Multiple morphologies  09/2008/18 5.2 From device   7/19 >10%   10/19 3.1%    Date Cr K Mg  5/17  0.8 4.1 2.1   6/18  0.78 4.2   9/19 0.81 4.4   3/20 0.88 4.3      The patient denies symptoms of fevers, chills, cough, or new SOB worrisome for COVID 19   Past Medical History:  Diagnosis Date  . Chronic systolic heart failure (HCC) 04/25/2013  . GERD (gastroesophageal reflux disease)   . Hypercholesteremia   . LBBB (left bundle branch block)   . Morbid obesity (HCC) 12/28/2014  . NICM (nonischemic  cardiomyopathy) (HCC)    a. s/p STJ CRTD  . OSA (obstructive sleep apnea) 12/28/2014   Severe with AHI 27 events per hour on average and the highest AHI was 40 events per hour  . Ventricular tachycardia (HCC)    a. s/p appropriate ICD therapy    Past Surgical History:  Procedure Laterality Date  . BIV ICD GENERATOR CHANGEOUT N/A 02/17/2018   Procedure: BIV ICD GENERATOR CHANGEOUT;  Surgeon: Duke Salvia, MD;  [facility]AS[facility] CV LAB;  Service: Cardiovascular;  Laterality: N/A;  . CARDIAC DEFIBRILLATOR PLACEMENT  2012   a. STJ CRTD implanted for NICM, CHF  . RIGHT/LEFT HEART CATH AND CORONARY ANGIOGRAPHY N/A 10/07/2018   Procedure: RIGHT/LEFT HEART CATH AND CORONARY ANGIOGRAPHY;  Surgeon: Laurey Morale, MD;  Location: [facility] INVASIVE CV LAB;  Service: Cardiovascular;  Laterality: N/A;  . TOTAL ABDOMINAL HYSTERECTOMY      Current Outpatient Medications  Medication Sig Dispense Refill  . bisoprolol (ZEBETA) 5 MG tablet Take 1 tablet (5 mg total) by mouth 2 (two) times daily. 180 tablet 3  . citalopram (CELEXA) 40 MG tablet Take 40 mg by mouth daily.     Marland Kitchen eplerenone (INSPRA) 25 MG tablet Take 1 tablet (25 mg total) by  mouth daily. 90 tablet 1  . flecainide (TAMBOCOR) 50 MG tablet TAKE 1 TABLET BY MOUTH TWICE A DAY 180 tablet 3  . furosemide (LASIX) 20 MG tablet Take 20 mg by mouth See admin instructions. Take 1 tablet (20 mg) by mouth scheduled in the morning, may take an additional dose in the afternoon if needed for fluid retention.    . pantoprazole (PROTONIX) 40 MG tablet Take 40 mg by mouth 2 (two) times daily.     . sacubitril-valsartan (ENTRESTO) 97-103 MG Take 1 tablet by mouth 2 (two) times daily. 180 tablet 1   No current facility-administered medications for this visit.     Allergies:   Ciprofloxacin hcl; Codeine; Morphine and related; and Sulfa antibiotics   Social History:  The patient  reports that she has never smoked. She has never used smokeless tobacco. She reports  current alcohol use. She reports that she does not use drugs.   Family History:  The patient's   family history includes Coronary artery disease in her mother; Heart attack in her mother; Heart disease in her father; Hyperlipidemia in her sister.   ROS:  Please see the history of present illness.   All other systems are personally reviewed and negative.    Exam:    Vital Signs:  BP 125/85   Pulse 67   Ht 5\' 2"  (1.575 m)   Wt 223 lb (101.2 kg)   BMI 40.79 kg/m     Well appearing, alert and conversant, regular work of breathing,  good skin color Eyes- anicteric, neuro- grossly intact, skin- no apparent rash or lesions or cyanosis, mouth- oral mucosa is pink   Labs/Other Tests and Data Reviewed:    Recent Labs: 10/01/2018: Platelets 364 10/07/2018: Hemoglobin 15.0; Hemoglobin 15.0 10/11/2018: BUN 12; Creatinine, Ser 0.88; Potassium 4.3; Sodium 137   Wt Readings from Last 3 Encounters:  12/03/18 223 lb (101.2 kg)  10/07/18 225 lb (102.1 kg)  10/01/18 228 lb (103.4 kg)     Other studies personally reviewed: Additional studies/ records that were reviewed today include: As above   Review of the above records today demonstrates: As above    Last device remote is reviewed from [employer] PDF dated 3/20* which reveals normal device function,   arrhythmias - PVCS ABOUT 4+ % WITH BiV pacing about mid 90s   ASSESSMENT & PLAN:    Nonischemic cardiomyopathy  Congestive heart failure- chronic-systolic  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 DM that flecainide is not ideal; however, seems to be working as she has tolerated it.  She has no coronary disease.  No interval ventricular tachycardia  Discussed with Dr. DM plans for MRI scanning; left to review protocols.  Otherwise would recommend echocardiogram   COVID 19 screen The patient denies symptoms of COVID  19 at this time.  The importance of social distancing was discussed today.  Follow-up:  42m Next remote: As Scheduled   Current medicines are reviewed at length with the patient today.   The patient does not have concerns regarding her medicines.  The following changes were made today:  none  Labs/ tests ordered today include:   No orders of the defined types were placed in this encounter.   Future tests ( post COVID )    Patient Risk:  after full review of this patients clinical status, I  feel that they are at moderate risk at this time.  Today, I have spent 11 minutes with the patient with telehealth technology discussing the above.  Signed, Sherryl MangesSteven Avilyn Virtue, MD  12/03/2018 9:31 AM     [facility] [address] [place] [address] [phone] (office) [phone] (fax)

## 2018-12-03 NOTE — Telephone Encounter (Signed)
Pt due for cardiac MRI.  Called [facility] to check compatibility of patients ICD.  Per [facility] Rep Greig Castilla, he reports patients ICD is compatible however MRI has to contact device rep when patient is comes for MRI.  Called and spoke with Tasha in Radiology, she states that it is part of their protocol to communicate with device reps when patient has an ICD who is scheduled for an MRI.

## 2018-12-13 ENCOUNTER — Ambulatory Visit (INDEPENDENT_AMBULATORY_CARE_PROVIDER_SITE_OTHER): Payer: BLUE CROSS/BLUE SHIELD

## 2018-12-13 ENCOUNTER — Other Ambulatory Visit: Payer: Self-pay

## 2018-12-13 DIAGNOSIS — I5022 Chronic systolic (congestive) heart failure: Secondary | ICD-10-CM | POA: Diagnosis not present

## 2018-12-13 DIAGNOSIS — Z95810 Presence of automatic (implantable) cardiac defibrillator: Secondary | ICD-10-CM

## 2018-12-13 NOTE — Progress Notes (Signed)
EPIC Encounter for ICM Monitoring  Patient Name: Alison Howard is a 58 y.o. female Date: 12/13/2018 Primary Care Physican: Johny Blamer, MD Primary Cardiologist:[facility] Electrophysiologist: Joycelyn Schmid Pacing: 92% Last Weight:218lbs 12/13/2018 Weight:218 lbs stable  Spoke with patient and transmission reviewed. She is asymptomatic. She forgot to take Lasix for last several days which more than likely attributed to the decreased impedance.   She did take Lasix this morning and getting back on track.  She is closing on her house this week and moving next week.  orvue thoracic impedanceabnormalsuggesting fluid accumulation in last 2 days.    Prescribed:Furosemide 20 mgTake 1 tablet (20 mg) by mouth scheduled in the morning, may take an additional dose in the afternoon if needed for fluid retention.  Labs: 10/11/2018 Creatinine 0.88, BUN 12, Potassium 4.3, Sodium 137, GFR >60 10/01/2018 Creatinine 0.79, BUN 12, Potassium 4.9, Sodium 137, GFR >60  Recommendations: Advised to call should she experience any fluid symptoms.  She knows by taking the Lasix the fluid will resolve.  Follow-up plan: ICM clinic phone appointment on6/22/2020.Visit scheduled with Dr Shirlee Latch 12/24/2018  Copy of ICM check sent to Dr. Graciela Husbands.  3 month ICM trend: 12/13/2018    1 Year ICM trend:       Karie Soda, RN 12/13/2018 12:55 PM

## 2018-12-14 ENCOUNTER — Other Ambulatory Visit: Payer: Self-pay

## 2018-12-14 ENCOUNTER — Encounter: Payer: BLUE CROSS/BLUE SHIELD | Admitting: *Deleted

## 2018-12-15 ENCOUNTER — Telehealth: Payer: Self-pay

## 2018-12-15 ENCOUNTER — Telehealth: Payer: Self-pay | Admitting: Internal Medicine

## 2018-12-15 NOTE — Telephone Encounter (Signed)
New message   Patient has questions about home remote transmission. Please call.

## 2018-12-15 NOTE — Telephone Encounter (Signed)
Left message for patient to remind of missed remote transmission.  

## 2018-12-15 NOTE — Telephone Encounter (Signed)
Spoke with patient to inform her that a transmission was received 12/13/2018. Patient denies further questions or concerns that this time.

## 2018-12-21 ENCOUNTER — Encounter (HOSPITAL_COMMUNITY): Payer: Self-pay

## 2018-12-21 ENCOUNTER — Ambulatory Visit (HOSPITAL_COMMUNITY)
Admission: RE | Admit: 2018-12-21 | Discharge: 2018-12-21 | Disposition: A | Discharge status: Home / Self Care | Payer: BLUE CROSS/BLUE SHIELD | Source: Ambulatory Visit | Attending: Cardiology | Admitting: Cardiology

## 2018-12-21 ENCOUNTER — Other Ambulatory Visit: Payer: Self-pay

## 2018-12-21 DIAGNOSIS — I5022 Chronic systolic (congestive) heart failure: Secondary | ICD-10-CM | POA: Diagnosis not present

## 2018-12-21 DIAGNOSIS — I429 Cardiomyopathy, unspecified: Secondary | ICD-10-CM

## 2018-12-21 MED ORDER — EPLERENONE 50 MG PO TABS: 50.0000 mg | ORAL_TABLET | Freq: Every day | ORAL | 1 refills | Status: DC

## 2018-12-21 NOTE — Progress Notes (Signed)
Heart Failure TeleHealth Note  Due to national recommendations of social distancing due to COVID 19, Audio/video telehealth visit is felt to be most appropriate for this patient at this time.  See MyChart message from today for patient consent regarding telehealth for [facility].  Date:  12/21/2018   ID:  Alison Howard, DOB 02-Mar-1961, MRN [id number]  Location: Home  Provider location: [address] [place] Type of Visit: Established patient  PCP:  Johny Blamer, MD  Cardiologist:  No primary care provider on file. Primary HF: Dr. Shirlee Latch  Chief Complaint:  Mild fatigue   History of Present Illness: Alison Howard is a 58 y.o. female who presents via audio/video conferencing for a telehealth visit today.     Today,  she denies symptoms of cough, fevers, chills, or new SOB worrisome for COVID 19.    Patient has history of CHF and VT was referred by Dr. Graciela Husbands for evaluation of CHF. She has a long history of cardiomyopathy, dating back to at least 2013.  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  She has had VT in the past terminated by [employer].  She has also had up to 22% PVCs in the past, down to 3.1% on 10/19 holter. She is currently on flecainide to suppress the PVCs.  Most recent echo in 12/19 showed severe dilation of the LV with EF 25-30%.   RHC/LHC in 3/20 showed normal filling pressures, CI 2.37, and no significant CAD. CPX in 3/20 showed no significant HF limitation, main problem appeared to be deconditioning.   She has been doing well symptomatically.  No significant exertional dyspnea, she walks her dog multiple times a day. Weight is down about 7 lbs. No palpitations. No orthopnea/PND.  No lightheadedness.   Labs (10/19): K 4.3, creatinine 0.88 Labs (3/20): K 4.3, creatinine 0.88  PMH:  1. Chronic systolic CHF: Nonischemic cardiomyopathy. St Jude BiV ICD.  - Echo (8/13): EF 40-45% - Echo (12/15): EF 25-30% - Echo (6/17): EF 35% - Echo (6/18): EF 40-45% - Echo  (11/18): EF 35-40% - Echo (12/19): EF 25-30%, severe LV dilation, mild MR.  - RHC/LHC (3/20): no CAD.  Mean RA 4, PA 27/10 mean 16, mean PCWP 5, CI 2.37.  - CPX (3/20): peak VO2 16.9 (97% predicted), VE/VCO2 23, RER 1.14 => no significant HF limitation, primarily limited by deconditioning.  2. PVCs/VT:  - 6/18 holter 22% PVCs - 10/19 holter 3.1% PVCs 3. OSA 4. Hyperlipidemia 5. Obesity  Current Outpatient Medications  Medication Sig Dispense Refill  . bisoprolol (ZEBETA) 5 MG tablet Take 1 tablet (5 mg total) by mouth 2 (two) times daily. 180 tablet 3  . citalopram (CELEXA) 40 MG tablet Take 40 mg by mouth daily.     Marland Kitchen eplerenone (INSPRA) 50 MG tablet Take 1 tablet (50 mg total) by mouth daily. 90 tablet 1  . flecainide (TAMBOCOR) 50 MG tablet TAKE 1 TABLET BY MOUTH TWICE A DAY 180 tablet 3  . furosemide (LASIX) 20 MG tablet Take 20 mg by mouth See admin instructions. Take 1 tablet (20 mg) by mouth scheduled in the morning, may take an additional dose in the afternoon if needed for fluid retention.    . pantoprazole (PROTONIX) 40 MG tablet Take 40 mg by mouth 2 (two) times daily.     . sacubitril-valsartan (ENTRESTO) 97-103 MG Take 1 tablet by mouth 2 (two) times daily. 180 tablet 1   No current facility-administered medications for this  encounter.     Allergies:   Ciprofloxacin hcl; Codeine; Morphine and related; and Sulfa antibiotics   Social History:  The patient  reports that she has never smoked. She has never used smokeless tobacco. She reports current alcohol use. She reports that she does not use drugs.   Family History:  The patient's family history includes Coronary artery disease in her mother; Heart attack in her mother; Heart disease in her father; Hyperlipidemia in her sister.   ROS:  Please see the history of present illness.   All other systems are personally reviewed and negative.   Exam:  (Tele Health Call; Exam is subjective and or/visual.) BP 115/68, HR 96  General:  No resp difficulty. Lungs: Normal respiratory effort with conversation.  Abdomen: Non-distended. Pt denies tenderness with self palpation.  Extremities: Pt denies edema. Neuro: Alert & oriented x 3.   Recent Labs: 10/01/2018: Platelets 364 10/07/2018: Hemoglobin 15.0; Hemoglobin 15.0 10/11/2018: BUN 12; Creatinine, Ser 0.88; Potassium 4.3; Sodium 137  Personally reviewed   Wt Readings from Last 3 Encounters:  12/03/18 101.2 kg (223 lb)  10/07/18 102.1 kg (225 lb)  10/01/18 103.4 kg (228 lb)     ASSESSMENT AND PLAN:  1. Chronic systolic CHF: Long history of nonischemic cardiomyopathy.  Most recent echo in 10/19 with severe LV dilation and EF 25-30%.  Has had frequent PVCs in the past, but most recent holter in 10/19 showed PVC count down to 3.1% on flecainide.  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 RHC/LHC in 3/20 with no significant CAD, normal feeling pressures, and preserved cardiac output.  CPX showed deconditioning but no significant HF limitation. NYHA class II symptoms.  - Continue bisoprolol 5 mg bid.  - Continue Entresto 97/103 bid.  - Increase eplerenone to 50 mg daily. BMET in 1 week.  - Continue Lasix 20 mg daily.   - I will arrange for cardiac MRI to look for evidence of infiltrative disease or myocarditis, her device is MRI compatible.   2. PVCs: Count down to 3.1% with holter in 10/19 but EF remains 25-30% by echo. Suspect not primarily a PVC-mediated CMP therefore.  PVCs likely are a consequence of the cardiomyopathy.  - She is on flecainide for PVC suppression as well as bisoprolol.  Not ideal with structure heart disease but probably ok if no coronary disease on cath.  3. Obesity: She has lost 7 lbs since I last talked with her.  4. Deconditioning: I would like her to do cardiac rehab when it re-opens.   COVID screen The patient does not have any symptoms that suggest any further testing/ screening at this time.  Social distancing reinforced today.  Recommended  follow-up:  3 months  Relevant cardiac medications were reviewed at length with the patient today.   The patient does not have concerns regarding their medications at this time.   Patient Risk: After full review of this patients clinical status, I feel that they are at moderate risk for cardiac decompensation at this time.  Today, I have spent 18 minutes with the patient with telehealth technology discussing CHF .    Signed, Marca Ancona, MD  12/21/2018   [facility] [address] Heart and Vascular [place] [address] [phone] (office) [phone] (fax)

## 2018-12-24 ENCOUNTER — Encounter (HOSPITAL_COMMUNITY): Payer: BLUE CROSS/BLUE SHIELD | Admitting: Cardiology

## 2018-12-28 ENCOUNTER — Other Ambulatory Visit (HOSPITAL_COMMUNITY): Payer: BLUE CROSS/BLUE SHIELD

## 2019-01-03 ENCOUNTER — Other Ambulatory Visit: Payer: Self-pay

## 2019-01-04 ENCOUNTER — Telehealth (HOSPITAL_COMMUNITY): Payer: Self-pay | Admitting: Emergency Medicine

## 2019-01-04 ENCOUNTER — Ambulatory Visit (HOSPITAL_COMMUNITY)
Admission: RE | Admit: 2019-01-04 | Discharge: 2019-01-04 | Disposition: A | Discharge status: Home / Self Care | Payer: BC Managed Care – PPO | Source: Ambulatory Visit | Attending: Cardiology | Admitting: Cardiology

## 2019-01-04 ENCOUNTER — Other Ambulatory Visit: Payer: Self-pay

## 2019-01-04 DIAGNOSIS — I5022 Chronic systolic (congestive) heart failure: Secondary | ICD-10-CM | POA: Insufficient documentation

## 2019-01-04 LAB — BASIC METABOLIC PANEL
Anion gap: 10 (ref 5–15)
BUN: 13 mg/dL (ref 6–20)
CO2: 29 mmol/L (ref 22–32)
Calcium: 9.1 mg/dL (ref 8.9–10.3)
Chloride: 99 mmol/L (ref 98–111)
Creatinine, Ser: 1.02 mg/dL — ABNORMAL HIGH (ref 0.44–1.00)
GFR calc Af Amer: >60 mL/min (ref >60)
GFR calc non Af Amer: >60 mL/min (ref >60)
Glucose, Bld: 100 mg/dL — ABNORMAL HIGH (ref 70–99)
Potassium: 4.4 mmol/L (ref 3.5–5.1)
Sodium: 138 mmol/L (ref 135–145)

## 2019-01-04 MED ORDER — SACUBITRIL-VALSARTAN 97-103 MG PO TABS: 1.0000 | ORAL_TABLET | Freq: Two times a day (BID) | ORAL | 3 refills | Status: DC

## 2019-01-04 NOTE — Telephone Encounter (Signed)
Pt returning phone call regarding upcoming cardiac imaging study; pt verbalizes understanding of appt date/time, parking situation and where to check in,and verified current allergies; name and call back number provided for further questions should they arise Trevon Strothers RN Navigator Cardiac Imaging [facility] [phone] office [phone] cell  Pt denies covid symptoms, verbalized understanding of visitor policy. 

## 2019-01-04 NOTE — Telephone Encounter (Signed)
Left message on voicemail with name and callback number King Pinzon RN Navigator Cardiac Imaging [facility] [phone] Office [phone] Cell  

## 2019-01-05 ENCOUNTER — Ambulatory Visit (HOSPITAL_COMMUNITY)
Admission: RE | Admit: 2019-01-05 | Discharge: 2019-01-05 | Disposition: A | Discharge status: Home / Self Care | Payer: BC Managed Care – PPO | Source: Ambulatory Visit | Attending: Cardiology | Admitting: Cardiology

## 2019-01-05 DIAGNOSIS — I5022 Chronic systolic (congestive) heart failure: Secondary | ICD-10-CM | POA: Diagnosis not present

## 2019-01-05 MED ORDER — GADOBUTROL 1 MMOL/ML IV SOLN
12.0000 mL | Freq: Once | INTRAVENOUS | Status: AC | PRN
  Administered 2019-01-05: 12 mL via INTRAVENOUS

## 2019-01-07 ENCOUNTER — Ambulatory Visit (INDEPENDENT_AMBULATORY_CARE_PROVIDER_SITE_OTHER): Payer: BC Managed Care – PPO | Admitting: *Deleted

## 2019-01-07 ENCOUNTER — Telehealth (HOSPITAL_COMMUNITY): Payer: Self-pay

## 2019-01-07 DIAGNOSIS — I429 Cardiomyopathy, unspecified: Secondary | ICD-10-CM

## 2019-01-07 LAB — CUP PACEART REMOTE DEVICE CHECK
Battery Remaining Longevity: 77 mo
Battery Remaining Percentage: 88 %
Battery Voltage: 3.08 V
Brady Statistic AP VP Percent: 6.1 %
Brady Statistic AP VS Percent: 1 %
Brady Statistic AS VP Percent: 85 %
Brady Statistic AS VS Percent: 3.4 %
Brady Statistic RA Percent Paced: 1.8 %
Date Time Interrogation Session: 20200610180931
HighPow Impedance: 53 Ohm
HighPow Impedance: 54 Ohm
Implantable Lead Implant Date: 20120529
Implantable Lead Implant Date: 20120529
Implantable Lead Implant Date: 20120529
Implantable Lead Location: 753858
Implantable Lead Location: 753859
Implantable Lead Location: 753860
Implantable Pulse Generator Implant Date: 20190724
Lead Channel Impedance Value: 1200 Ohm
Lead Channel Impedance Value: 410 Ohm
Lead Channel Impedance Value: 490 Ohm
Lead Channel Pacing Threshold Amplitude: 0.75 V
Lead Channel Pacing Threshold Amplitude: 1.25 V
Lead Channel Pacing Threshold Amplitude: 1.875 V
Lead Channel Pacing Threshold Pulse Width: 0.5 ms
Lead Channel Pacing Threshold Pulse Width: 0.5 ms
Lead Channel Pacing Threshold Pulse Width: 0.5 ms
Lead Channel Sensing Intrinsic Amplitude: 12 mV
Lead Channel Sensing Intrinsic Amplitude: 2.5 mV
Lead Channel Setting Pacing Amplitude: 2 V
Lead Channel Setting Pacing Amplitude: 2.25 V
Lead Channel Setting Pacing Amplitude: 2.375
Lead Channel Setting Pacing Pulse Width: 0.5 ms
Lead Channel Setting Pacing Pulse Width: 0.5 ms
Lead Channel Setting Sensing Sensitivity: 0.5 mV
Pulse Gen Serial Number: 9842506

## 2019-01-07 NOTE — Telephone Encounter (Signed)
-----   Message from Larey Dresser, MD sent at 01/06/2019  4:53 PM EDT ----- Technically difficult study with ICD, but EF appears somewhat better at 34%.

## 2019-01-07 NOTE — Telephone Encounter (Signed)
Pt aware of results of MR.  Pt appreciative.

## 2019-01-10 ENCOUNTER — Encounter: Payer: Self-pay | Admitting: Cardiology

## 2019-01-10 NOTE — Progress Notes (Signed)
Remote ICD transmission.   

## 2019-01-11 ENCOUNTER — Telehealth (HOSPITAL_COMMUNITY): Payer: Self-pay | Admitting: *Deleted

## 2019-01-11 NOTE — Telephone Encounter (Signed)
         Confirm Consent - In the setting of the current Covid19 crisis, you are scheduled for a phone visit with your Cardiac or Pulmonary team member.  Just as we do with many in-gym visits, in order for you to participate in this visit, we must obtain consent.  If you'd like, I can send this to your mychart (if signed up) or email for you to review.  Otherwise, I can obtain your verbal consent now.  By agreeing to a telephone visit, we'd like you to understand that the technology does not allow for your Cardiac or Pulmonary Rehab team member to perform a physical assessment, and thus may limit their ability to fully assess your ability to perform exercise programs. If your provider identifies any concerns that need to be evaluated in person, we will make arrangements to do so.  Finally, though the technology is pretty good, we cannot assure that it will always work on either your or our end and we cannot ensure that we have a secure connection.  Cardiac and Pulmonary Rehab Telehealth visits and "At Home" cardiac and pulmonary rehab are provided at no cost to you.        Are you willing to proceed?"        STAFF: Did the patient verbally acknowledge consent to telehealth visit? Document YES/NO here: Yes  Pt is interested in participating in Virtual Cardiac Rehab. Pt advised that Virtual Cardiac Rehab is provided at no cost to the patient.  Checklist:  1. Pt has smart device  ie smartphone and/or ipad for downloading an app  Yes 2. Reliable internet/wifi service    Yes 3. Understands how to use their smartphone and navigate within an app.  Yes   Reviewed with pt the scheduling process for virtual cardiac rehab.  Pt verbalized understanding.    Barnet Pall RN  Cardiac and Pulmonary Rehab Staff        [phone]    @ 12:46pm

## 2019-01-11 NOTE — Telephone Encounter (Signed)
  Dr.  Aundra Dubin   As you are aware our department remains closed to patients due to Covid-19.  We are excited to be able to offer an alternative to traditional onsite Cardiac Rehab while your patient continues to follow Re-Open guidelines.  This is a notification that your patient has been contacted and is very interested in participating in Virtual Cardiac Rehab.  Thank you for your continued support in helping [place] meet the health care needs of our patients.  Harrell Gave RN BSN  Cardiac Rehab staff

## 2019-01-12 ENCOUNTER — Telehealth (HOSPITAL_COMMUNITY): Payer: Self-pay

## 2019-01-12 ENCOUNTER — Encounter (HOSPITAL_COMMUNITY)
Admission: RE | Admit: 2019-01-12 | Discharge: 2019-01-12 | Disposition: A | Discharge status: Home / Self Care | Payer: Self-pay | Source: Ambulatory Visit | Attending: Cardiology | Admitting: Cardiology

## 2019-01-12 ENCOUNTER — Other Ambulatory Visit: Payer: Self-pay

## 2019-01-12 NOTE — Telephone Encounter (Signed)
Called and spoke to pt regarding Virtual Cardiac Rehab.  Pt  was able to download the [employer] app on their smart device with no issues. Pt set up their account and received the following welcome message -"Welcome to the [facility] program. We hope that you will find the exercise program beneficial in your recovery process. Our staff is available to assist with any questions/concerns about your exercise routine. Best wishes". Brief orientation provided to with the advisement to watch the "Intro to Rehab" series located under the Resource tab. Pt verbalized understanding. Will continue to follow and monitor pt progress with feedback as needed. 

## 2019-01-12 NOTE — Telephone Encounter (Signed)
Phone call made to Pt to check and make sure her virtual CR application was working correctly and was able to navigate through it. Pt was successful in doing so and her app is working properly.

## 2019-01-13 ENCOUNTER — Encounter (HOSPITAL_COMMUNITY)
Admission: RE | Admit: 2019-01-13 | Discharge: 2019-01-13 | Disposition: A | Discharge status: Home / Self Care | Payer: Self-pay | Source: Ambulatory Visit | Attending: Cardiology | Admitting: Cardiology

## 2019-01-13 ENCOUNTER — Other Ambulatory Visit: Payer: Self-pay

## 2019-01-13 ENCOUNTER — Telehealth (HOSPITAL_COMMUNITY): Payer: Self-pay

## 2019-01-13 NOTE — Telephone Encounter (Signed)
Called pt to discuss heart healthy eating. Pt has made several heart healthy changes to diet (label reading, low sodium, lean cooking methods). Encouraged pt to continue with these and make the following heart healthy changes a permanent part of dietary pattern:  1.Eat a balanced diet with whole grains, fruits, vegetables, lean protein sources. 2.Choose heart healthy unsaturated fats. Limit saturated fats, trans fats. Eat more plant based meals using beans and soy foods for protein. 3. Eat whole unprocessed foods to limit the amount of sodium (salt) consumed. 4. Choose a consistent amunt of carbohydrates at each meal and snack.  5. Limit refined carbohydrates especially sugar, sweets, and sugar-sweetened beverages. 6. Limit alcohol consumption.  Pt has IBS and is trying gluten free to see if this aids in management of her symptoms, reviewed gluten free complex carb grains that pt could explore as an alternative. Patient was engaged in discussion today, and open to trying dietitian recommendations. Praised pt for his open mindedness and willingness to try new suggestions, encouraged pt to keep up the good work.   Spent 40 minutes discussing heart heatlhy eating with patient today   Laurina Bustle, MS, RD, LDN 01/13/2019 10:32 AM

## 2019-01-17 ENCOUNTER — Ambulatory Visit (INDEPENDENT_AMBULATORY_CARE_PROVIDER_SITE_OTHER): Payer: BC Managed Care – PPO

## 2019-01-17 DIAGNOSIS — I5022 Chronic systolic (congestive) heart failure: Secondary | ICD-10-CM | POA: Diagnosis not present

## 2019-01-17 DIAGNOSIS — Z95810 Presence of automatic (implantable) cardiac defibrillator: Secondary | ICD-10-CM | POA: Diagnosis not present

## 2019-01-19 NOTE — Progress Notes (Signed)
EPIC Encounter for ICM Monitoring  Patient Name: Alison Howard is a 58 y.o. female Date: 01/19/2019 Primary Care Physican: Shirline Frees, MD Primary Cardiologist:[facility] Electrophysiologist: Vergie Living Pacing: 90% 6/24/2020Weight:224 lbs stable  Spoke with patient and transmission reviewed. She is asymptomatic.   Corvue thoracic impedancenormal.  Prescribed:Furosemide 20 mgTake 1 tablet (20 mg) by mouth scheduled in the morning, may take an additional dose in the afternoon if needed for fluid retention.  Labs: 01/04/2019 Creatinine 1.02, BUN 13, Potassium 4.4, Sodium 138, GFR >60 10/11/2018 Creatinine 0.88, BUN 12, Potassium 4.3, Sodium 137, GFR >60 10/01/2018 Creatinine 0.79, BUN 12, Potassium 4.9, Sodium 137, GFR >60  Recommendations: Advised to call should she experience any fluid symptoms.    Follow-up plan: ICM clinic phone appointment on7/27/2020.  Copy of ICM check sent to Dr. Caryl Comes.  3 month ICM trend: 01/17/2019    1 Year ICM trend:       [name], RN 01/19/2019 12:17 PM

## 2019-02-10 ENCOUNTER — Telehealth (HOSPITAL_COMMUNITY): Payer: Self-pay

## 2019-02-21 ENCOUNTER — Ambulatory Visit (INDEPENDENT_AMBULATORY_CARE_PROVIDER_SITE_OTHER): Payer: BC Managed Care – PPO

## 2019-02-21 DIAGNOSIS — I5022 Chronic systolic (congestive) heart failure: Secondary | ICD-10-CM | POA: Diagnosis not present

## 2019-02-21 DIAGNOSIS — Z95810 Presence of automatic (implantable) cardiac defibrillator: Secondary | ICD-10-CM | POA: Diagnosis not present

## 2019-02-22 NOTE — Progress Notes (Signed)
EPIC Encounter for ICM Monitoring  Patient Name: KENNADIE BRENNER is a 58 y.o. female Date: 02/22/2019 Primary Care Physican: Shirline Frees, MD Primary Cardiologist:[facility] Electrophysiologist: Vergie Living Pacing: 92% 6/24/2020Weight:224 lbs stable 02/22/2019 Weight: 223-225 lbs  Spoke with patient and transmission reviewed. She is asymptomatic.  Advised report suggested possible dryness 7/16 - 7/26 and recommended she drink enough fluids to stay hydrated, approx 64 oz daily.  Corvue thoracic impedancenormal.  Prescribed:Furosemide 20 mgTake 1 tablet (20 mg) by mouth scheduled in the morning, may take an additional dose in the afternoon if needed for fluid retention.  Labs: 01/04/2019 Creatinine 1.02, BUN 13, Potassium 4.4, Sodium 138, GFR >60 10/11/2018 Creatinine 0.88, BUN 12, Potassium 4.3, Sodium 137, GFR >60 10/01/2018 Creatinine 0.79, BUN 12, Potassium 4.9, Sodium 137, GFR >60  Recommendations:Advised to call should she experience any fluid symptoms.   Follow-up plan: ICM clinic phone appointment on8/31/2020.  Copy of ICM check sent to Dr. Caryl Comes.  3 month ICM trend: 02/21/2019    1 Year ICM trend:       [name], RN 02/22/2019 2:28 PM

## 2019-03-23 ENCOUNTER — Other Ambulatory Visit (HOSPITAL_COMMUNITY): Payer: Self-pay | Admitting: Cardiology

## 2019-03-28 ENCOUNTER — Ambulatory Visit (INDEPENDENT_AMBULATORY_CARE_PROVIDER_SITE_OTHER): Payer: BC Managed Care – PPO

## 2019-03-28 DIAGNOSIS — Z95810 Presence of automatic (implantable) cardiac defibrillator: Secondary | ICD-10-CM | POA: Diagnosis not present

## 2019-03-28 DIAGNOSIS — I5022 Chronic systolic (congestive) heart failure: Secondary | ICD-10-CM

## 2019-03-29 NOTE — Progress Notes (Signed)
EPIC Encounter for ICM Monitoring  Patient Name: Alison Howard is a 58 y.o. female Date: 03/29/2019 Primary Care Physican: Shirline Frees, MD Primary Cardiologist:[facility] Electrophysiologist: Vergie Living Pacing: 94% 02/22/2019 Weight: 223-225 lbs  Spoke with patient and transmission reviewed. She is asymptomatic.    Corvue thoracic impedancenormal.    Prescribed:Furosemide 20 mgTake 1 tablet (20 mg) by mouth scheduled in the morning, may take an additional dose in the afternoon if needed for fluid retention.  Labs: 01/04/2019 Creatinine 1.02, BUN 13, Potassium 4.4, Sodium 138, GFR >60 10/11/2018 Creatinine 0.88, BUN 12, Potassium 4.3, Sodium 137, GFR >60 10/01/2018 Creatinine 0.79, BUN 12, Potassium 4.9, Sodium 137, GFR >60  Recommendations:No changes and encouraged to call if experiencing any fluid symptoms.  Follow-up plan: ICM clinic phone appointment on 05/16/2019.   91 day device clinic remote transmission 04/08/2019.    Copy of ICM check sent to Dr. Caryl Comes.   3 month ICM trend: 03/28/2019    1 Year ICM trend:       [name], RN 03/29/2019 10:20 AM

## 2019-04-01 ENCOUNTER — Telehealth (HOSPITAL_COMMUNITY): Payer: Self-pay | Admitting: Vascular Surgery

## 2019-04-01 NOTE — Telephone Encounter (Signed)
Left pt message giving 3 moth f/u w/ Mclean 9/22 @ 12 noon, asked pt to call office to confirm appt

## 2019-04-08 ENCOUNTER — Ambulatory Visit (INDEPENDENT_AMBULATORY_CARE_PROVIDER_SITE_OTHER): Payer: BC Managed Care – PPO | Admitting: *Deleted

## 2019-04-08 DIAGNOSIS — I5022 Chronic systolic (congestive) heart failure: Secondary | ICD-10-CM | POA: Diagnosis not present

## 2019-04-08 DIAGNOSIS — I429 Cardiomyopathy, unspecified: Secondary | ICD-10-CM

## 2019-04-08 LAB — CUP PACEART REMOTE DEVICE CHECK
Battery Remaining Longevity: 74 mo
Battery Remaining Percentage: 85 %
Battery Voltage: 3.02 V
Brady Statistic AP VP Percent: 8.6 %
Brady Statistic AP VS Percent: 1 %
Brady Statistic AS VP Percent: 85 %
Brady Statistic AS VS Percent: 1.9 %
Brady Statistic RA Percent Paced: 3.6 %
Date Time Interrogation Session: 20200911060017
HighPow Impedance: 58 Ohm
HighPow Impedance: 58 Ohm
Implantable Lead Implant Date: 20120529
Implantable Lead Implant Date: 20120529
Implantable Lead Implant Date: 20120529
Implantable Lead Location: 753858
Implantable Lead Location: 753859
Implantable Lead Location: 753860
Implantable Pulse Generator Implant Date: 20190724
Lead Channel Impedance Value: 1125 Ohm
Lead Channel Impedance Value: 410 Ohm
Lead Channel Impedance Value: 540 Ohm
Lead Channel Pacing Threshold Amplitude: 0.75 V
Lead Channel Pacing Threshold Amplitude: 1.25 V
Lead Channel Pacing Threshold Amplitude: 1.375 V
Lead Channel Pacing Threshold Pulse Width: 0.5 ms
Lead Channel Pacing Threshold Pulse Width: 0.5 ms
Lead Channel Pacing Threshold Pulse Width: 0.5 ms
Lead Channel Sensing Intrinsic Amplitude: 12 mV
Lead Channel Sensing Intrinsic Amplitude: 3 mV
Lead Channel Setting Pacing Amplitude: 2 V
Lead Channel Setting Pacing Amplitude: 2 V
Lead Channel Setting Pacing Amplitude: 2.25 V
Lead Channel Setting Pacing Pulse Width: 0.5 ms
Lead Channel Setting Pacing Pulse Width: 0.5 ms
Lead Channel Setting Sensing Sensitivity: 0.5 mV
Pulse Gen Serial Number: 9842506

## 2019-04-15 NOTE — Progress Notes (Signed)
Carelink Summary Report / Loop Recorder 

## 2019-04-16 DIAGNOSIS — Z803 Family history of malignant neoplasm of breast: Secondary | ICD-10-CM | POA: Diagnosis not present

## 2019-04-16 DIAGNOSIS — Z1231 Encounter for screening mammogram for malignant neoplasm of breast: Secondary | ICD-10-CM | POA: Diagnosis not present

## 2019-04-19 ENCOUNTER — Encounter (HOSPITAL_COMMUNITY): Payer: Self-pay | Admitting: Cardiology

## 2019-04-19 ENCOUNTER — Ambulatory Visit (HOSPITAL_COMMUNITY)
Admission: RE | Admit: 2019-04-19 | Discharge: 2019-04-19 | Disposition: A | Discharge status: Home / Self Care | Payer: BC Managed Care – PPO | Source: Ambulatory Visit | Attending: Cardiology | Admitting: Cardiology

## 2019-04-19 ENCOUNTER — Other Ambulatory Visit: Payer: Self-pay

## 2019-04-19 VITALS — BP 115/65 | HR 69 | Wt 229.6 lb

## 2019-04-19 DIAGNOSIS — Z8249 Family history of ischemic heart disease and other diseases of the circulatory system: Secondary | ICD-10-CM | POA: Insufficient documentation

## 2019-04-19 DIAGNOSIS — Z6841 Body Mass Index (BMI) 40.0 and over, adult: Secondary | ICD-10-CM | POA: Insufficient documentation

## 2019-04-19 DIAGNOSIS — Z7984 Long term (current) use of oral hypoglycemic drugs: Secondary | ICD-10-CM | POA: Diagnosis not present

## 2019-04-19 DIAGNOSIS — Z950 Presence of cardiac pacemaker: Secondary | ICD-10-CM | POA: Insufficient documentation

## 2019-04-19 DIAGNOSIS — R5383 Other fatigue: Secondary | ICD-10-CM | POA: Insufficient documentation

## 2019-04-19 DIAGNOSIS — I428 Other cardiomyopathies: Secondary | ICD-10-CM | POA: Diagnosis not present

## 2019-04-19 DIAGNOSIS — Z79899 Other long term (current) drug therapy: Secondary | ICD-10-CM | POA: Insufficient documentation

## 2019-04-19 DIAGNOSIS — E785 Hyperlipidemia, unspecified: Secondary | ICD-10-CM | POA: Diagnosis not present

## 2019-04-19 DIAGNOSIS — E669 Obesity, unspecified: Secondary | ICD-10-CM | POA: Diagnosis not present

## 2019-04-19 DIAGNOSIS — G4733 Obstructive sleep apnea (adult) (pediatric): Secondary | ICD-10-CM | POA: Diagnosis not present

## 2019-04-19 DIAGNOSIS — I5022 Chronic systolic (congestive) heart failure: Secondary | ICD-10-CM | POA: Insufficient documentation

## 2019-04-19 DIAGNOSIS — I493 Ventricular premature depolarization: Secondary | ICD-10-CM

## 2019-04-19 LAB — BASIC METABOLIC PANEL
Anion gap: 12 (ref 5–15)
BUN: 7 mg/dL (ref 6–20)
CO2: 24 mmol/L (ref 22–32)
Calcium: 9 mg/dL (ref 8.9–10.3)
Chloride: 102 mmol/L (ref 98–111)
Creatinine, Ser: 0.86 mg/dL (ref 0.44–1.00)
GFR calc Af Amer: >60 mL/min (ref >60)
GFR calc non Af Amer: >60 mL/min (ref >60)
Glucose, Bld: 101 mg/dL — ABNORMAL HIGH (ref 70–99)
Potassium: 4.1 mmol/L (ref 3.5–5.1)
Sodium: 138 mmol/L (ref 135–145)

## 2019-04-19 MED ORDER — FARXIGA 10 MG PO TABS: 10.0000 mg | ORAL_TABLET | Freq: Every day | ORAL | 5 refills | Status: DC

## 2019-04-19 MED ORDER — FUROSEMIDE 20 MG PO TABS: 20.0000 mg | ORAL_TABLET | ORAL | 5 refills | Status: DC | PRN

## 2019-04-19 NOTE — Patient Instructions (Signed)
Labs done today. We will contact you only if your labs are abnormal.  DECREASE Lasix, only take as needed.  START Farxiga 10mg (1 Tab) once daily.  Your physician recommends that you schedule a follow-up appointment in: 2 weeks for a lab only appointment and in 3 months to see Dr. Aundra Dubin.  At the [facility], you and your health needs are our priority. As part of our continuing mission to provide you with exceptional heart care, we have created designated Provider Care Teams. These Care Teams include your primary Cardiologist (physician) and Advanced Practice Providers (APPs- Physician Assistants and Nurse Practitioners) who all work together to provide you with the care you need, when you need it.   You may see any of the following providers on your designated Care Team at your next follow up: Marland Kitchen Dr Glori Bickers . Dr Loralie Champagne . Darrick Grinder, NP   Please be sure to bring in all your medications bottles to every appointment.

## 2019-04-19 NOTE — Progress Notes (Signed)
Date:  04/19/2019   ID:  Alison Howard, DOB 1960/10/04, MRN [id number]   Provider location: [address], [place] Type of Visit: Established patient  PCP:  Johny Blamer, MD  Cardiologist:  No primary care provider on file. Primary HF: Dr. Shirlee Latch  Chief Complaint:  Mild fatigue   History of Present Illness: Alison Howard is a 58 y.o. female who has history of CHF and VT was referred by Dr. Graciela Husbands for evaluation of CHF. She has a long history of cardiomyopathy, dating back to at least 2013.  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  She has had VT in the past terminated by [employer].  She has also had up to 22% PVCs in the past, down to 3.1% on 10/19 holter. She is currently on flecainide to suppress the PVCs.  Most recent echo in 12/19 showed severe dilation of the LV with EF 25-30%.   RHC/LHC in 3/20 showed normal filling pressures, CI 2.37, and no significant CAD. CPX in 3/20 showed no significant HF limitation, main problem appeared to be deconditioning.   She returns today for followup of CHF.  Weight is stable.  She is working from home, still not getting a lot of exercise.  She walks her dog several times a day without dyspnea and is able to walk up a flight of stairs without problems.  No orthopnea/PND.  No chest pain or palpitations.   St Jude device interrogation: 93% BiV paced, no AF, stable thoracic impedance.   Labs (10/19): K 4.3, creatinine 0.88 Labs (3/20): K 4.3, creatinine 0.88 Labs (6/20): K 4.4, creatinine 1.02  PMH:  1. Chronic systolic CHF: Nonischemic cardiomyopathy. St Jude BiV ICD.  - Echo (8/13): EF 40-45% - Echo (12/15): EF 25-30% - Echo (6/17): EF 35% - Echo (6/18): EF 40-45% - Echo (11/18): EF 35-40% - Echo (12/19): EF 25-30%, severe LV dilation, mild MR.  - RHC/LHC (3/20): no CAD.  Mean RA 4, PA 27/10 mean 16, mean PCWP 5, CI 2.37.  - CPX (3/20): peak VO2 16.9 (97% predicted), VE/VCO2 23, RER 1.14 => no significant HF limitation, primarily limited by  deconditioning.  - Cardiac MRI (6/20): Very difficult study due to pacemaker artifact.  EF 34%, delayed enhancement images not interpretable.  2. PVCs/VT:  - 6/18 holter 22% PVCs - 10/19 holter 3.1% PVCs 3. OSA 4. Hyperlipidemia 5. Obesity  Current Outpatient Medications  Medication Sig Dispense Refill  . bisoprolol (ZEBETA) 5 MG tablet Take 1 tablet (5 mg total) by mouth 2 (two) times daily. 180 tablet 3  . citalopram (CELEXA) 40 MG tablet Take 40 mg by mouth daily.     Marland Kitchen eplerenone (INSPRA) 50 MG tablet TAKE 1 TABLET BY MOUTH EVERY DAY 90 tablet 3  . flecainide (TAMBOCOR) 50 MG tablet TAKE 1 TABLET BY MOUTH TWICE A DAY 180 tablet 3  . furosemide (LASIX) 20 MG tablet Take 1 tablet (20 mg total) by mouth as needed. 30 tablet 5  . pantoprazole (PROTONIX) 40 MG tablet Take 40 mg by mouth 2 (two) times daily.     . sacubitril-valsartan (ENTRESTO) 97-103 MG Take 1 tablet by mouth 2 (two) times daily. 180 tablet 3  . dapagliflozin propanediol (FARXIGA) 10 MG TABS tablet Take 10 mg by mouth daily before breakfast. 30 tablet 5   No current facility-administered medications for this encounter.     Allergies:   Ciprofloxacin hcl, Codeine, Morphine and related, and Sulfa antibiotics   Social History:  The patient  reports that she has never smoked. She has never used smokeless tobacco. She reports current alcohol use. She reports that she does not use drugs.   Family History:  The patient's family history includes Coronary artery disease in her mother; Heart attack in her mother; Heart disease in her father; Hyperlipidemia in her sister.   ROS:  Please see the history of present illness.   All other systems are personally reviewed and negative.   Exam:   BP 115/65   Pulse 69   Wt 104.1 kg (229 lb 9.6 oz)   SpO2 98%   BMI 41.99 kg/m  General: NAD Neck: No JVD, no thyromegaly or thyroid nodule.  Lungs: Clear to auscultation bilaterally with normal respiratory effort. CV: Nondisplaced  PMI.  Heart regular S1/S2, no S3/S4, no murmur.  No peripheral edema.  No carotid bruit.  Normal pedal pulses.  Abdomen: Soft, nontender, no hepatosplenomegaly, no distention.  Skin: Intact without lesions or rashes.  Neurologic: Alert and oriented x 3.  Psych: Normal affect. Extremities: No clubbing or cyanosis.  HEENT: Normal.   Recent Labs: 10/01/2018: Platelets 364 10/07/2018: Hemoglobin 15.0; Hemoglobin 15.0 04/19/2019: BUN 7; Creatinine, Ser 0.86; Potassium 4.1; Sodium 138  Personally reviewed   Wt Readings from Last 3 Encounters:  04/19/19 104.1 kg (229 lb 9.6 oz)  12/03/18 101.2 kg (223 lb)  10/07/18 102.1 kg (225 lb)     ASSESSMENT AND PLAN:  1. Chronic systolic CHF: Long history of nonischemic cardiomyopathy.  Most recent echo in 10/19 with severe LV dilation and EF 25-30%.  Has had frequent PVCs in the past, but most recent holter in 10/19 showed PVC count down to 3.1% on flecainide.  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 RHC/LHC in 3/20 with no significant CAD, normal feeling pressures, and preserved cardiac output.  CPX showed deconditioning but no significant HF limitation. Cardiac MRI in 6/20 was a very difficult study due to pacemaker artifact, LV EF 34% but LGE was not interpretable.  NYHA class II symptoms, she is not volume overloaded on exam.  - Continue bisoprolol 5 mg bid.  - Continue Entresto 97/103 bid.  - Continue eplerenone 50 mg daily.  - Start dapagliflozin 10 mg daily. BMET today and again in 2 wks.  - Change Lasix to prn.   2. PVCs: Count down to 3.1% with holter in 10/19 but EF remains 25-30% by echo. Suspect not primarily a PVC-mediated CMP therefore.  PVCs likely are a consequence of the cardiomyopathy.  - She is on flecainide for PVC suppression as well as bisoprolol.  Not ideal with structure heart disease but probably ok if no coronary disease on cath.  3. Obesity: Continue efforts at diet/exercise for weight loss.   Followup in 3 months.   Signed, Loralie Champagne, MD  04/19/2019   [facility] [address] Heart and [place] [address] [phone] (office) [phone] (fax)

## 2019-05-03 ENCOUNTER — Other Ambulatory Visit: Payer: Self-pay

## 2019-05-03 ENCOUNTER — Ambulatory Visit (HOSPITAL_COMMUNITY)
Admission: RE | Admit: 2019-05-03 | Discharge: 2019-05-03 | Disposition: A | Discharge status: Home / Self Care | Payer: BC Managed Care – PPO | Source: Ambulatory Visit | Attending: Internal Medicine | Admitting: Internal Medicine

## 2019-05-03 DIAGNOSIS — I5022 Chronic systolic (congestive) heart failure: Secondary | ICD-10-CM | POA: Diagnosis not present

## 2019-05-03 LAB — BASIC METABOLIC PANEL
Anion gap: 11 (ref 5–15)
BUN: 8 mg/dL (ref 6–20)
CO2: 23 mmol/L (ref 22–32)
Calcium: 8.7 mg/dL — ABNORMAL LOW (ref 8.9–10.3)
Chloride: 106 mmol/L (ref 98–111)
Creatinine, Ser: 1 mg/dL (ref 0.44–1.00)
GFR calc Af Amer: >60 mL/min (ref >60)
GFR calc non Af Amer: >60 mL/min (ref >60)
Glucose, Bld: 103 mg/dL — ABNORMAL HIGH (ref 70–99)
Potassium: 4.3 mmol/L (ref 3.5–5.1)
Sodium: 140 mmol/L (ref 135–145)

## 2019-05-07 ENCOUNTER — Other Ambulatory Visit: Payer: Self-pay | Admitting: Internal Medicine

## 2019-05-16 ENCOUNTER — Ambulatory Visit (INDEPENDENT_AMBULATORY_CARE_PROVIDER_SITE_OTHER): Payer: BC Managed Care – PPO

## 2019-05-16 DIAGNOSIS — I5022 Chronic systolic (congestive) heart failure: Secondary | ICD-10-CM | POA: Diagnosis not present

## 2019-05-16 DIAGNOSIS — Z95810 Presence of automatic (implantable) cardiac defibrillator: Secondary | ICD-10-CM

## 2019-05-16 NOTE — Progress Notes (Signed)
EPIC Encounter for ICM Monitoring  Patient Name: Alison Howard is a 58 y.o. female Date: 05/16/2019 Primary Care Physican: Shirline Frees, MD Primary Cardiologist:[place] Electrophysiologist: Vergie Living Pacing: 94% 05/16/2019 Weight: 228 lbs  Spoke with patient and transmission reviewed. She is asymptomatic.  She has been takingFarxiga as prescribed by Dr Aundra Dubin but it is making her itch all over and is going to stop the medication.    Corvue thoracic impedancesuggesting possible fluid accumulation since 05/02/2019 but is at baseline today, 05/16/2019.    Prescribed:Furosemide 20 mgTake 1 tablet (20 mg) by mouth as needed.  Labs: 05/03/2019 Creatinine 1.00, BUN 8,   Potassium 4.3, Sodium 140, GFR >60 04/19/2019 Creatinine 0.86, BUN 7,   Potassium 4.1, Sodium 138, GFR >60 01/04/2019 Creatinine 1.02, BUN 13, Potassium 4.4, Sodium 138, GFR >60 10/11/2018 Creatinine 0.88, BUN 12, Potassium 4.3, Sodium 137, GFR >60 10/01/2018 Creatinine 0.79, BUN 12, Potassium 4.9, Sodium 137, GFR >60  Recommendations:       Patient wants to inform Dr Aundra Dubin she is stopping Farxiga due to itching reaction.  She will take Furosemide as needed.  Follow-up plan: ICM clinic phone appointment on  06/16/2019.   91 day device clinic remote transmission 07/08/2019.  Office appt 07/11/2019 with Dr. Aundra Dubin.    Copy of ICM check sent to Dr. Caryl Comes and Dr Aundra Dubin for review regarding stopping [place].   3 month ICM trend: 05/16/2019    1 Year ICM trend:       [name], RN 05/16/2019 3:46 PM

## 2019-05-23 ENCOUNTER — Other Ambulatory Visit (HOSPITAL_COMMUNITY): Payer: Self-pay

## 2019-05-23 MED ORDER — BISOPROLOL FUMARATE 5 MG PO TABS: 5.0000 mg | ORAL_TABLET | Freq: Two times a day (BID) | ORAL | 3 refills | Status: DC

## 2019-05-24 DIAGNOSIS — I5022 Chronic systolic (congestive) heart failure: Secondary | ICD-10-CM | POA: Diagnosis not present

## 2019-05-24 DIAGNOSIS — F325 Major depressive disorder, single episode, in full remission: Secondary | ICD-10-CM | POA: Diagnosis not present

## 2019-05-24 DIAGNOSIS — K219 Gastro-esophageal reflux disease without esophagitis: Secondary | ICD-10-CM | POA: Diagnosis not present

## 2019-05-24 DIAGNOSIS — Z0000 Encounter for general adult medical examination without abnormal findings: Secondary | ICD-10-CM | POA: Diagnosis not present

## 2019-05-30 DIAGNOSIS — R195 Other fecal abnormalities: Secondary | ICD-10-CM | POA: Diagnosis not present

## 2019-06-14 DIAGNOSIS — R195 Other fecal abnormalities: Secondary | ICD-10-CM | POA: Diagnosis not present

## 2019-06-14 DIAGNOSIS — E7800 Pure hypercholesterolemia, unspecified: Secondary | ICD-10-CM | POA: Diagnosis not present

## 2019-06-14 DIAGNOSIS — Z6841 Body Mass Index (BMI) 40.0 and over, adult: Secondary | ICD-10-CM | POA: Diagnosis not present

## 2019-06-15 DIAGNOSIS — R195 Other fecal abnormalities: Secondary | ICD-10-CM | POA: Diagnosis not present

## 2019-06-17 ENCOUNTER — Telehealth: Payer: Self-pay

## 2019-06-17 NOTE — Telephone Encounter (Signed)
Left message for patient to remind of missed remote transmission.  

## 2019-06-22 NOTE — Progress Notes (Signed)
No ICM remote transmission received for 06/13/2019 and next ICM transmission scheduled for 07/11/2019.

## 2019-07-05 DIAGNOSIS — R195 Other fecal abnormalities: Secondary | ICD-10-CM | POA: Diagnosis not present

## 2019-07-08 ENCOUNTER — Ambulatory Visit (INDEPENDENT_AMBULATORY_CARE_PROVIDER_SITE_OTHER): Payer: BC Managed Care – PPO | Admitting: *Deleted

## 2019-07-08 DIAGNOSIS — I5022 Chronic systolic (congestive) heart failure: Secondary | ICD-10-CM | POA: Diagnosis not present

## 2019-07-08 LAB — CUP PACEART REMOTE DEVICE CHECK
Battery Remaining Longevity: 72 mo
Battery Remaining Percentage: 82 %
Battery Voltage: 3.01 V
Brady Statistic AP VP Percent: 7.4 %
Brady Statistic AP VS Percent: 1 %
Brady Statistic AS VP Percent: 87 %
Brady Statistic AS VS Percent: 1.7 %
Brady Statistic RA Percent Paced: 2.9 %
Date Time Interrogation Session: 20201211020016
HighPow Impedance: 61 Ohm
HighPow Impedance: 62 Ohm
Implantable Lead Implant Date: 20120529
Implantable Lead Implant Date: 20120529
Implantable Lead Implant Date: 20120529
Implantable Lead Location: 753858
Implantable Lead Location: 753859
Implantable Lead Location: 753860
Implantable Pulse Generator Implant Date: 20190724
Lead Channel Impedance Value: 1100 Ohm
Lead Channel Impedance Value: 410 Ohm
Lead Channel Impedance Value: 510 Ohm
Lead Channel Pacing Threshold Amplitude: 0.75 V
Lead Channel Pacing Threshold Amplitude: 1.25 V
Lead Channel Pacing Threshold Amplitude: 1.25 V
Lead Channel Pacing Threshold Pulse Width: 0.5 ms
Lead Channel Pacing Threshold Pulse Width: 0.5 ms
Lead Channel Pacing Threshold Pulse Width: 0.5 ms
Lead Channel Sensing Intrinsic Amplitude: 3 mV
Lead Channel Sensing Intrinsic Amplitude: 5.5 mV
Lead Channel Setting Pacing Amplitude: 2 V
Lead Channel Setting Pacing Amplitude: 2 V
Lead Channel Setting Pacing Amplitude: 2.25 V
Lead Channel Setting Pacing Pulse Width: 0.5 ms
Lead Channel Setting Pacing Pulse Width: 0.5 ms
Lead Channel Setting Sensing Sensitivity: 0.5 mV
Pulse Gen Serial Number: 9842506

## 2019-07-11 ENCOUNTER — Ambulatory Visit (HOSPITAL_COMMUNITY)
Admission: RE | Admit: 2019-07-11 | Discharge: 2019-07-11 | Disposition: A | Discharge status: Home / Self Care | Payer: BC Managed Care – PPO | Source: Ambulatory Visit | Attending: Cardiology | Admitting: Cardiology

## 2019-07-11 ENCOUNTER — Ambulatory Visit (INDEPENDENT_AMBULATORY_CARE_PROVIDER_SITE_OTHER): Payer: BC Managed Care – PPO

## 2019-07-11 ENCOUNTER — Other Ambulatory Visit: Payer: Self-pay

## 2019-07-11 DIAGNOSIS — Z95810 Presence of automatic (implantable) cardiac defibrillator: Secondary | ICD-10-CM | POA: Diagnosis not present

## 2019-07-11 DIAGNOSIS — I493 Ventricular premature depolarization: Secondary | ICD-10-CM

## 2019-07-11 DIAGNOSIS — E785 Hyperlipidemia, unspecified: Secondary | ICD-10-CM

## 2019-07-11 DIAGNOSIS — E669 Obesity, unspecified: Secondary | ICD-10-CM

## 2019-07-11 DIAGNOSIS — I5022 Chronic systolic (congestive) heart failure: Secondary | ICD-10-CM

## 2019-07-11 DIAGNOSIS — I428 Other cardiomyopathies: Secondary | ICD-10-CM | POA: Diagnosis not present

## 2019-07-11 DIAGNOSIS — G4733 Obstructive sleep apnea (adult) (pediatric): Secondary | ICD-10-CM

## 2019-07-11 NOTE — Progress Notes (Signed)
Heart Failure TeleHealth Note  Due to national recommendations of social distancing due to COVID 19, Audio/video telehealth visit is felt to be most appropriate for this patient at this time.  See MyChart message from today for patient consent regarding telehealth for [facility].  Date:  07/11/2019   ID:  Alison Howard, DOB February 12, 1961, MRN [id number]  Location: Home  Provider location: [facility] Type of Visit: Established patient  PCP:  Johny Blamer, MD  Cardiologist:  Dr. Shirlee Latch  Chief Complaint: Fatigue   History of Present Illness: Alison Howard is a 57 y.o. female who presents via audio/video conferencing for a telehealth visit today.     she denies symptoms worrisome for COVID 19.   Patient has a history of CHF and VT was referred by Dr. Graciela Husbands for evaluation of CHF. She has a long history of cardiomyopathy, dating back to at least 2013.  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  She has had VT in the past terminated by [employer].  She has also had up to 22% PVCs in the past, down to 3.1% on 10/19 holter. She is currently on flecainide to suppress the PVCs.  Most recent echo in 12/19 showed severe dilation of the LV with EF 25-30%.   RHC/LHC in 3/20 showed normal filling pressures, CI 2.37, and no significant CAD. CPX in 3/20 showed no significant HF limitation, main problem appeared to be deconditioning.   She has been stable recently.  She had to stop [place] due to itching (resolved off [place]).  No dyspnea walking on flat ground.  No peripheral edema.  No lightheadedness.  No orthopnea/PND.  No chest pain.    Recent St Jude device interrogation: Stable thoracic impedance.    Labs (10/19): K 4.3, creatinine 0.88 Labs (3/20): K 4.3, creatinine 0.88 Labs (6/20): K 4.4, creatinine 1.02 Labs (11/20): K 4.5 creatinine 0.88, hgb 14.4  PMH:  1. Chronic systolic CHF: Nonischemic cardiomyopathy. St Jude BiV ICD.  - Echo (8/13): EF 40-45% - Echo (12/15): EF  25-30% - Echo (6/17): EF 35% - Echo (6/18): EF 40-45% - Echo (11/18): EF 35-40% - Echo (12/19): EF 25-30%, severe LV dilation, mild MR.  - RHC/LHC (3/20): no CAD.  Mean RA 4, PA 27/10 mean 16, mean PCWP 5, CI 2.37.  - CPX (3/20): peak VO2 16.9 (97% predicted), VE/VCO2 23, RER 1.14 => no significant HF limitation, primarily limited by deconditioning.  - Cardiac MRI (6/20): Very difficult study due to pacemaker artifact.  EF 34%, delayed enhancement images not interpretable.  2. PVCs/VT:  - 6/18 holter 22% PVCs - 10/19 holter 3.1% PVCs 3. OSA 4. Hyperlipidemia 5. Obesity  Current Outpatient Medications  Medication Sig Dispense Refill  . bisoprolol (ZEBETA) 5 MG tablet Take 1 tablet (5 mg total) by mouth 2 (two) times daily. 180 tablet 3  . citalopram (CELEXA) 40 MG tablet Take 40 mg by mouth daily.     . dapagliflozin propanediol (FARXIGA) 10 MG TABS tablet Take 10 mg by mouth daily before breakfast. 30 tablet 5  . eplerenone (INSPRA) 50 MG tablet TAKE 1 TABLET BY MOUTH EVERY DAY 90 tablet 3  . flecainide (TAMBOCOR) 50 MG tablet TAKE 1 TABLET BY MOUTH TWICE A DAY 180 tablet 3  . furosemide (LASIX) 20 MG tablet Take 1 tablet (20 mg total) by mouth as needed. 30 tablet 5  . pantoprazole (PROTONIX) 40 MG tablet Take 40 mg by mouth 2 (two) times daily.     Marland Kitchen  sacubitril-valsartan (ENTRESTO) 97-103 MG Take 1 tablet by mouth 2 (two) times daily. 180 tablet 3   No current facility-administered medications for this encounter.    Allergies:   Ciprofloxacin hcl, Codeine, Morphine and related, and Sulfa antibiotics   Social History:  The patient  reports that she has never smoked. She has never used smokeless tobacco. She reports current alcohol use. She reports that she does not use drugs.   Family History:  The patient's family history includes Coronary artery disease in her mother; Heart attack in her mother; Heart disease in her father; Hyperlipidemia in her sister.   ROS:  Please see the  history of present illness.   All other systems are personally reviewed and negative.   Exam:   (Video/Tele Health Call; Exam is subjective and or/visual.) General:  Speaks in full sentences. No resp difficulty. Lungs: Normal respiratory effort with conversation.  Abdomen: Non-distended per patient report Extremities: Pt denies edema. Neuro: Alert & oriented x 3.   Recent Labs: 10/01/2018: Platelets 364 10/07/2018: Hemoglobin 15.0; Hemoglobin 15.0 05/03/2019: BUN 8; Creatinine, Ser 1.00; Potassium 4.3; Sodium 140  Personally reviewed   Wt Readings from Last 3 Encounters:  04/19/19 104.1 kg (229 lb 9.6 oz)  12/03/18 101.2 kg (223 lb)  10/07/18 102.1 kg (225 lb)     ASSESSMENT AND PLAN:  1. Chronic systolic CHF: Long history of nonischemic cardiomyopathy.  Most recent echo in 10/19 with severe LV dilation and EF 25-30%.  Has had frequent PVCs in the past, but most recent holter in 10/19 showed PVC count down to 3.1% on flecainide.  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 RHC/LHC in 3/20 with no significant CAD, normal feeling pressures, and preserved cardiac output.  CPX showed deconditioning but no significant HF limitation. Cardiac MRI in 6/20 was a very difficult study due to pacemaker artifact, LV EF 34% but LGE was not interpretable.  NYHA class II symptoms, volume status ok by Corvue.  - Continue bisoprolol 5 mg bid.  - Continue Entresto 97/103 bid.  - Continue eplerenone 50 mg daily.  - She was unable to tolerate dapagliflozin due to itching.  We discussed trying empagliflozin, will defer to next visit.   - Continue Lasix prn.   2. PVCs: Count down to 3.1% with holter in 10/19 but EF remains 25-30% by echo. Suspect not primarily a PVC-mediated CMP therefore.  PVCs likely are a consequence of the cardiomyopathy.  - She is on flecainide for PVC suppression as well as bisoprolol.  Not ideal with structure heart disease but probably ok if no coronary disease on cath.  3. Obesity: Continue  efforts at diet/exercise for weight loss.   COVID screen The patient does not have any symptoms that suggest any further testing/ screening at this time.  Social distancing reinforced today.  Patient Risk: After full review of this patients clinical status, I feel that they are at moderate risk for cardiac decompensation at this time.  Relevant cardiac medications were reviewed at length with the patient today. The patient does not have concerns regarding their medications at this time.   Recommended follow-up:  4 months  Today, I have spent 16 minutes with the patient with telehealth technology discussing the above issues .    Signed, Loralie Champagne, MD  07/11/2019  [facility] [address] [facility] [place] [address] [phone] (office) [phone] (fax)

## 2019-07-11 NOTE — Progress Notes (Signed)
EPIC Encounter for ICM Monitoring  Patient Name: Alison Howard is a 58 y.o. female Date: 07/11/2019 Primary Care Physican: Shirline Frees, MD Primary Cardiologist:[place] Electrophysiologist: Vergie Living Pacing: 94% 05/16/2019 Weight: 228 lbs  Transmission reviewed.    Corvue thoracic impedancenormal but was suggesting possible fluid accumulation between 11/7 - 11/27.   Prescribed:Furosemide 20 mgTake 1 tablet (20 mg) by mouth as needed.  Labs: 05/03/2019 Creatinine 1.00, BUN 8,   Potassium 4.3, Sodium 140, GFR >60 04/19/2019 Creatinine 0.86, BUN 7,   Potassium 4.1, Sodium 138, GFR >60 01/04/2019 Creatinine 1.02, BUN 13, Potassium 4.4, Sodium 138, GFR >60 10/11/2018 Creatinine 0.88, BUN 12, Potassium 4.3, Sodium 137, GFR >60 10/01/2018 Creatinine 0.79, BUN 12, Potassium 4.9, Sodium 137, GFR >60  Recommendations: None  Follow-up plan: ICM clinic phone appointment on 08/15/2019.   91 day device clinic remote transmission 10/07/2019.  Virtual office appt 07/11/2019 with Dr. Aundra Dubin.    Copy of ICM check sent to Dr. Caryl Comes and Dr Aundra Dubin since patient has virtual visit 07/11/2019.   3 month ICM trend: 07/08/2019    1 Year ICM trend:       Alison Billings, RN 07/11/2019 7:43 AM

## 2019-07-11 NOTE — Progress Notes (Signed)
AVS discussed with patient. Called Dr Oley Balm office [place] [place]) to fax lab results.  4 month appt made with MD.  No questions endorsed

## 2019-07-12 ENCOUNTER — Telehealth: Payer: Self-pay

## 2019-07-12 NOTE — Telephone Encounter (Signed)
Remote ICM transmission received.  Attempted call to patient regarding ICM remote transmission and left detailed message per DPR.  Advised to return call for any fluid symptoms or questions. Next ICM remote transmission scheduled 06/04/2020.     

## 2019-07-12 NOTE — Progress Notes (Signed)
Attempted call to patient and unable to reach.  Left detailed message per DPR regarding transmission. Transmission reviewed.  Left voice mail with ICM number and encouraged to call if experiencing any fluid symptoms.

## 2019-07-15 ENCOUNTER — Other Ambulatory Visit (HOSPITAL_COMMUNITY): Payer: Self-pay | Admitting: Cardiology

## 2019-07-28 ENCOUNTER — Encounter (HOSPITAL_COMMUNITY): Payer: Self-pay | Admitting: *Deleted

## 2019-07-28 NOTE — Progress Notes (Signed)
Pt discharged from virtual cardiac rehab app, [facility] d/t completion of time in program. Data will be scanned into chart.

## 2019-08-08 ENCOUNTER — Telehealth: Payer: Self-pay

## 2019-08-08 NOTE — Telephone Encounter (Signed)
The pt states she do not have insurance coverage for this month. She asked me to reschedule for next month and I did. The pt thanked me for my help.

## 2019-08-13 NOTE — Progress Notes (Signed)
ICD remote 

## 2019-09-05 ENCOUNTER — Other Ambulatory Visit: Payer: Self-pay | Admitting: Internal Medicine

## 2019-09-05 MED ORDER — FLECAINIDE ACETATE 50 MG PO TABS: 50.0000 mg | ORAL_TABLET | Freq: Two times a day (BID) | ORAL | 0 refills | Status: DC

## 2019-09-07 MED ORDER — FLECAINIDE ACETATE 50 MG PO TABS: 50.0000 mg | ORAL_TABLET | Freq: Two times a day (BID) | ORAL | 0 refills | Status: DC

## 2019-09-07 NOTE — Addendum Note (Signed)
Addended by: Demetrios Loll on: 09/07/2019 11:36 AM   Modules accepted: Orders

## 2019-09-15 ENCOUNTER — Ambulatory Visit (INDEPENDENT_AMBULATORY_CARE_PROVIDER_SITE_OTHER): Payer: BC Managed Care – PPO

## 2019-09-15 DIAGNOSIS — Z95810 Presence of automatic (implantable) cardiac defibrillator: Secondary | ICD-10-CM

## 2019-09-15 DIAGNOSIS — I5022 Chronic systolic (congestive) heart failure: Secondary | ICD-10-CM

## 2019-09-20 NOTE — Progress Notes (Signed)
EPIC Encounter for ICM Monitoring  Patient Name: Alison Howard is a 59 y.o. female Date: 09/20/2019 Primary Care Physican: Johny Blamer, MD Primary Cardiologist:[place] Electrophysiologist: Joycelyn Schmid Pacing: 94% Last Weight: 228lbs  Transmission reviewed.    Corvue thoracic impedancenormal.  Prescribed:Furosemide 20 mgTake 1 tablet (20 mg) by mouthas needed.  Labs: 05/03/2019 Creatinine 1.00, BUN 8, Potassium 4.3, Sodium 140, GFR >60 04/19/2019 Creatinine 0.86, BUN 7, Potassium 4.1, Sodium 138, GFR >60 01/04/2019 Creatinine 1.02, BUN 13, Potassium 4.4, Sodium 138, GFR >60 10/11/2018 Creatinine 0.88, BUN 12, Potassium 4.3, Sodium 137, GFR >60 10/01/2018 Creatinine 0.79, BUN 12, Potassium 4.9, Sodium 137, GFR >60  Recommendations: None  Follow-up plan: ICM clinic phone appointment on 10/10/2019.   91 day device clinic remote transmission 10/07/2019.  Office visit with Dr Shirlee Latch 11/11/2019.  Copy of ICM check sent to Dr. Graciela Husbands  3 month direct ICM trend: 09/14/2019        Karie Soda, RN 09/20/2019 10:11 AM

## 2019-10-07 ENCOUNTER — Ambulatory Visit (INDEPENDENT_AMBULATORY_CARE_PROVIDER_SITE_OTHER): Payer: Self-pay | Admitting: *Deleted

## 2019-10-07 DIAGNOSIS — I5022 Chronic systolic (congestive) heart failure: Secondary | ICD-10-CM

## 2019-10-08 LAB — CUP PACEART REMOTE DEVICE CHECK
Battery Remaining Longevity: 70 mo
Battery Remaining Percentage: 79 %
Battery Voltage: 2.99 V
Brady Statistic AP VP Percent: 6.7 %
Brady Statistic AP VS Percent: 1 %
Brady Statistic AS VP Percent: 88 %
Brady Statistic AS VS Percent: 1.5 %
Brady Statistic RA Percent Paced: 2.7 %
Date Time Interrogation Session: 20210312020015
HighPow Impedance: 63 Ohm
HighPow Impedance: 63 Ohm
Implantable Lead Implant Date: 20120529
Implantable Lead Implant Date: 20120529
Implantable Lead Implant Date: 20120529
Implantable Lead Location: 753858
Implantable Lead Location: 753859
Implantable Lead Location: 753860
Implantable Pulse Generator Implant Date: 20190724
Lead Channel Impedance Value: 1075 Ohm
Lead Channel Impedance Value: 410 Ohm
Lead Channel Impedance Value: 610 Ohm
Lead Channel Pacing Threshold Amplitude: 0.75 V
Lead Channel Pacing Threshold Amplitude: 1.25 V
Lead Channel Pacing Threshold Amplitude: 1.375 V
Lead Channel Pacing Threshold Pulse Width: 0.5 ms
Lead Channel Pacing Threshold Pulse Width: 0.5 ms
Lead Channel Pacing Threshold Pulse Width: 0.5 ms
Lead Channel Sensing Intrinsic Amplitude: 12 mV
Lead Channel Sensing Intrinsic Amplitude: 2.2 mV
Lead Channel Setting Pacing Amplitude: 2 V
Lead Channel Setting Pacing Amplitude: 2 V
Lead Channel Setting Pacing Amplitude: 2.25 V
Lead Channel Setting Pacing Pulse Width: 0.5 ms
Lead Channel Setting Pacing Pulse Width: 0.5 ms
Lead Channel Setting Sensing Sensitivity: 0.5 mV
Pulse Gen Serial Number: 9842506

## 2019-10-10 ENCOUNTER — Ambulatory Visit (INDEPENDENT_AMBULATORY_CARE_PROVIDER_SITE_OTHER): Payer: BC Managed Care – PPO

## 2019-10-10 DIAGNOSIS — Z95810 Presence of automatic (implantable) cardiac defibrillator: Secondary | ICD-10-CM

## 2019-10-10 DIAGNOSIS — I5022 Chronic systolic (congestive) heart failure: Secondary | ICD-10-CM | POA: Diagnosis not present

## 2019-10-12 NOTE — Progress Notes (Signed)
EPIC Encounter for ICM Monitoring  Patient Name: Alison Howard is a 59 y.o. female Date: 10/12/2019 Primary Care Physican: Johny Blamer, MD Primary Cardiologist:[place] Electrophysiologist: Joycelyn Schmid Pacing: 95% 10/12/2019 Weight: 228lbs  Spoke with patient and reports feeling well at this time.  Denies fluid symptoms.     Corvue thoracic impedancenormal.  Prescribed:Furosemide 20 mgTake 1 tablet (20 mg) by mouthas needed.  Labs: 06/15/2019 Creatinine 0.88, BUN 10, Potassium 4.5, Sodium 140, GFR 66-80 05/03/2019 Creatinine 1.00, BUN 8, Potassium 4.3, Sodium 140, GFR >60 04/19/2019 Creatinine 0.86, BUN 7, Potassium 4.1, Sodium 138, GFR >60 01/04/2019 Creatinine 1.02, BUN 13, Potassium 4.4, Sodium 138, GFR >60 10/11/2018 Creatinine 0.88, BUN 12, Potassium 4.3, Sodium 137, GFR >60 10/01/2018 Creatinine 0.79, BUN 12, Potassium 4.9, Sodium 137, GFR >60  Recommendations:None  Follow-up plan: ICM clinic phone appointment on4/19/2021. 91 day device clinic remote transmission 01/06/2020.Office visit with Dr Shirlee Latch 11/11/2019.  Copy of ICM check sent to Dr.Klein  3 month ICM trend: 10/07/2019    1 Year ICM trend:       Karie Soda, RN 10/12/2019 2:26 PM

## 2019-11-08 ENCOUNTER — Other Ambulatory Visit: Payer: Self-pay | Admitting: Internal Medicine

## 2019-11-11 ENCOUNTER — Encounter (HOSPITAL_COMMUNITY): Payer: Self-pay | Admitting: Cardiology

## 2019-11-11 ENCOUNTER — Ambulatory Visit (HOSPITAL_COMMUNITY)
Admission: RE | Admit: 2019-11-11 | Discharge: 2019-11-11 | Disposition: A | Discharge status: Home / Self Care | Payer: BC Managed Care – PPO | Source: Ambulatory Visit | Attending: Cardiology | Admitting: Cardiology

## 2019-11-11 ENCOUNTER — Other Ambulatory Visit: Payer: Self-pay

## 2019-11-11 VITALS — BP 130/88 | HR 78 | Wt 240.2 lb

## 2019-11-11 DIAGNOSIS — Z7984 Long term (current) use of oral hypoglycemic drugs: Secondary | ICD-10-CM | POA: Insufficient documentation

## 2019-11-11 DIAGNOSIS — I428 Other cardiomyopathies: Secondary | ICD-10-CM | POA: Diagnosis not present

## 2019-11-11 DIAGNOSIS — Z950 Presence of cardiac pacemaker: Secondary | ICD-10-CM | POA: Insufficient documentation

## 2019-11-11 DIAGNOSIS — Z8249 Family history of ischemic heart disease and other diseases of the circulatory system: Secondary | ICD-10-CM | POA: Insufficient documentation

## 2019-11-11 DIAGNOSIS — Z79899 Other long term (current) drug therapy: Secondary | ICD-10-CM | POA: Diagnosis not present

## 2019-11-11 DIAGNOSIS — E785 Hyperlipidemia, unspecified: Secondary | ICD-10-CM | POA: Insufficient documentation

## 2019-11-11 DIAGNOSIS — I5022 Chronic systolic (congestive) heart failure: Secondary | ICD-10-CM | POA: Insufficient documentation

## 2019-11-11 DIAGNOSIS — I493 Ventricular premature depolarization: Secondary | ICD-10-CM | POA: Diagnosis not present

## 2019-11-11 DIAGNOSIS — Z6841 Body Mass Index (BMI) 40.0 and over, adult: Secondary | ICD-10-CM | POA: Insufficient documentation

## 2019-11-11 DIAGNOSIS — E669 Obesity, unspecified: Secondary | ICD-10-CM | POA: Insufficient documentation

## 2019-11-11 DIAGNOSIS — Z7901 Long term (current) use of anticoagulants: Secondary | ICD-10-CM | POA: Diagnosis not present

## 2019-11-11 LAB — BASIC METABOLIC PANEL
Anion gap: 9 (ref 5–15)
BUN: 10 mg/dL (ref 6–20)
CO2: 26 mmol/L (ref 22–32)
Calcium: 8.8 mg/dL — ABNORMAL LOW (ref 8.9–10.3)
Chloride: 107 mmol/L (ref 98–111)
Creatinine, Ser: 0.91 mg/dL (ref 0.44–1.00)
GFR calc Af Amer: >60 mL/min (ref >60)
GFR calc non Af Amer: >60 mL/min (ref >60)
Glucose, Bld: 105 mg/dL — ABNORMAL HIGH (ref 70–99)
Potassium: 3.8 mmol/L (ref 3.5–5.1)
Sodium: 142 mmol/L (ref 135–145)

## 2019-11-11 LAB — LIPID PANEL
Cholesterol: 284 mg/dL — ABNORMAL HIGH (ref 0–200)
HDL: 70 mg/dL (ref >40)
LDL Cholesterol: 185 mg/dL — ABNORMAL HIGH (ref 0–99)
Total CHOL/HDL Ratio: 4.1 RATIO
Triglycerides: 145 mg/dL (ref <150)
VLDL: 29 mg/dL (ref 0–40)

## 2019-11-11 MED ORDER — EMPAGLIFLOZIN 10 MG PO TABS: 10.0000 mg | ORAL_TABLET | Freq: Every day | ORAL | 5 refills | Status: DC

## 2019-11-11 MED ORDER — DAPAGLIFLOZIN PROPANEDIOL 10 MG PO TABS: 10.0000 mg | ORAL_TABLET | Freq: Every day | ORAL | 5 refills | Status: DC

## 2019-11-11 NOTE — Patient Instructions (Addendum)
START Jardiance 10mg  (1 tab) daily  Take Lasix 20mg  (1 tab) for 3 days then stop.   Labs today and repeat in 2 weeks We will only contact you if something comes back abnormal or we need to make some changes. Otherwise no news is good news!  Your physician recommends that you schedule a follow-up appointment in: 4 months with Dr  Please call office at [phone] option 2 if you have any questions or concerns.   At the [facility], you and your health needs are our priority. As part of our continuing mission to provide you with exceptional heart care, we have created designated Provider Care Teams. These Care Teams include your primary Cardiologist (physician) and Advanced Practice Providers (APPs- Physician Assistants and Nurse Practitioners) who all work together to provide you with the care you need, when you need it.   You may see any of the following providers on your designated Care Team at your next follow up: Shirlee Latch Dr [phone] . Dr Marland Kitchen . Arvilla Meres, NP . Marca Ancona, PA . Tonye Becket, PharmD   Please be sure to bring in all your medications bottles to every appointment.

## 2019-11-13 NOTE — Progress Notes (Signed)
ID:  Alison Howard, DOB 28-Sep-1960, MRN [id number]   Provider location: [facility] Type of Visit: Established patient  PCP:  Shirline Frees, MD  Cardiologist:  Dr. Aundra Dubin   History of Present Illness: Alison Howard is a 59 y.o. female who has a history of CHF and VT and was referred by Dr. Caryl Comes for evaluation of CHF. She has a long history of cardiomyopathy, dating back to at least 2013.  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  She has had VT in the past terminated by [employer].  She has also had up to 22% PVCs in the past, down to 3.1% on 10/19 holter. She is currently on flecainide to suppress the PVCs.  Echo in 12/19 showed severe dilation of the LV with EF 25-30%.   RHC/LHC in 3/20 showed normal filling pressures, CI 2.37, and no significant CAD. CPX in 3/20 showed no significant HF limitation, main problem appeared to be deconditioning.   She returns today for followup of CHF.  She had to stop [place] due to itching (resolved off [place]).  She fatigues with walking longer distances, 2 blocks or so.  She walks her dog daily.  No palpitations, no lightheadedness.  No chest pain.  No orthopnea/PND. She takes Lasix rarely.  Weight is up about 11 lbs since last appointment.  She attributes this to staying at home and exercising less.   St Jude device interrogation: 94% BiV pacing, decreasing thoracic impedance.   Labs (10/19): K 4.3, creatinine 0.88 Labs (3/20): K 4.3, creatinine 0.88 Labs (6/20): K 4.4, creatinine 1.02 Labs (11/20): K 4.5 creatinine 0.88, hgb 14.4  ECG (personally reviewed): a-paced, BiV paced  PMH:  1. Chronic systolic CHF: Nonischemic cardiomyopathy. St Jude BiV ICD.  - Echo (8/13): EF 40-45% - Echo (12/15): EF 25-30% - Echo (6/17): EF 35% - Echo (6/18): EF 40-45% - Echo (11/18): EF 35-40% - Echo (12/19): EF 25-30%, severe LV dilation, mild MR.  - RHC/LHC (3/20): no CAD.  Mean RA 4, PA 27/10 mean 16, mean PCWP 5, CI 2.37.  - CPX (3/20): peak VO2  16.9 (97% predicted), VE/VCO2 23, RER 1.14 => no significant HF limitation, primarily limited by deconditioning.  - Cardiac MRI (6/20): Very difficult study due to pacemaker artifact.  EF 34%, delayed enhancement images not interpretable.  2. PVCs/VT:  - 6/18 holter 22% PVCs - 10/19 holter 3.1% PVCs 3. OSA 4. Hyperlipidemia 5. Obesity  Current Outpatient Medications  Medication Sig Dispense Refill  . bisoprolol (ZEBETA) 5 MG tablet Take 1 tablet (5 mg total) by mouth 2 (two) times daily. 180 tablet 3  . citalopram (CELEXA) 40 MG tablet Take 40 mg by mouth daily.     Marland Kitchen eplerenone (INSPRA) 50 MG tablet TAKE 1 TABLET BY MOUTH EVERY DAY 90 tablet 3  . ezetimibe (ZETIA) 10 MG tablet Take 10 mg by mouth daily.    . flecainide (TAMBOCOR) 50 MG tablet Take 50 mg by mouth daily.    . furosemide (LASIX) 20 MG tablet Take 1 tablet (20 mg total) by mouth as needed. 30 tablet 5  . pantoprazole (PROTONIX) 40 MG tablet Take 40 mg by mouth 2 (two) times daily.     . sacubitril-valsartan (ENTRESTO) 97-103 MG Take 1 tablet by mouth 2 (two) times daily. 180 tablet 3  . empagliflozin (JARDIANCE) 10 MG TABS tablet Take 10 mg by mouth daily before breakfast. 30 tablet 5   No current facility-administered medications  for this encounter.    Allergies:   Ciprofloxacin hcl, Codeine, Morphine and related, Sulfa antibiotics, and Dapagliflozin   Social History:  The patient  reports that she has never smoked. She has never used smokeless tobacco. She reports current alcohol use. She reports that she does not use drugs.   Family History:  The patient's family history includes Coronary artery disease in her mother; Heart attack in her mother; Heart disease in her father; Hyperlipidemia in her sister.   ROS:  Please see the history of present illness.   All other systems are personally reviewed and negative.   Exam:   BP 130/88   Pulse 78   Wt 109 kg (240 lb 3.2 oz)   SpO2 97%   BMI 43.93 kg/m  General:  NAD Neck: No JVD, no thyromegaly or thyroid nodule.  Lungs: Clear to auscultation bilaterally with normal respiratory effort. CV: Nondisplaced PMI.  Heart regular S1/S2, no S3/S4, no murmur.  No peripheral edema.  No carotid bruit.  Normal pedal pulses.  Abdomen: Soft, nontender, no hepatosplenomegaly, no distention.  Skin: Intact without lesions or rashes.  Neurologic: Alert and oriented x 3.  Psych: Normal affect. Extremities: No clubbing or cyanosis.  HEENT: Normal.   Recent Labs: 11/11/2019: BUN 10; Creatinine, Ser 0.91; Potassium 3.8; Sodium 142  Personally reviewed   Wt Readings from Last 3 Encounters:  11/11/19 109 kg (240 lb 3.2 oz)  04/19/19 104.1 kg (229 lb 9.6 oz)  12/03/18 101.2 kg (223 lb)     ASSESSMENT AND PLAN:  1. Chronic systolic CHF: Long history of nonischemic cardiomyopathy.  Most recent echo in 10/19 with severe LV dilation and EF 25-30%.  Has had frequent PVCs in the past, but most recent holter in 10/19 showed PVC count down to 3.1% on flecainide.  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 RHC/LHC in 3/20 with no significant CAD, normal feeling pressures, and preserved cardiac output.  CPX showed deconditioning but no significant HF limitation. Cardiac MRI in 6/20 was a very difficult study due to pacemaker artifact, LV EF 34% but LGE was not interpretable.  NYHA class II symptoms.  She is not particularly volume overloaded by exam but weight is up and thoracic impedance decreasing on Corvue.  - Continue bisoprolol 5 mg bid.  - Continue Entresto 97/103 bid.  - Continue eplerenone 50 mg daily.  - She was unable to tolerate dapagliflozin due to itching.  She is going to try empagliflozin 10 mg daily to see if she can tolerate this.  - With increased weight and lower thoracic impedance, I will have her take Lasix 20 mg daily x 3 days then back to prn.  - BMET today and again in 2 wks.  2. PVCs: Count down to 3.1% with holter in 10/19 but EF remains 25-30% by echo. Suspect not  primarily a PVC-mediated CMP therefore.  PVCs likely are a consequence of the cardiomyopathy.  - She is on flecainide for PVC suppression as well as bisoprolol.  Not ideal with structure heart disease but probably ok if no coronary disease on cath.  3. Obesity: Continue efforts at diet/exercise for weight loss.   Followup in 4 months.     Signed, Marca Ancona, MD  11/13/2019  [facility] [address] [facility] [place] [address] [phone] (office) [phone] (fax)

## 2019-11-14 ENCOUNTER — Ambulatory Visit (INDEPENDENT_AMBULATORY_CARE_PROVIDER_SITE_OTHER): Payer: BC Managed Care – PPO

## 2019-11-14 DIAGNOSIS — I5022 Chronic systolic (congestive) heart failure: Secondary | ICD-10-CM | POA: Diagnosis not present

## 2019-11-14 DIAGNOSIS — Z95810 Presence of automatic (implantable) cardiac defibrillator: Secondary | ICD-10-CM

## 2019-11-15 ENCOUNTER — Encounter (HOSPITAL_COMMUNITY): Payer: Self-pay

## 2019-11-16 ENCOUNTER — Other Ambulatory Visit (HOSPITAL_COMMUNITY): Payer: Self-pay | Admitting: *Deleted

## 2019-11-16 MED ORDER — EZETIMIBE 10 MG PO TABS: 10.0000 mg | ORAL_TABLET | Freq: Every day | ORAL | 3 refills | Status: DC

## 2019-11-16 NOTE — Progress Notes (Signed)
EPIC Encounter for ICM Monitoring  Patient Name: Alison Howard is a 59 y.o. female Date: 11/16/2019 Primary Care Physican: Johny Blamer, MD Primary Cardiologist:[place] Electrophysiologist: Joycelyn Schmid Pacing: 94% 4/21/2021Weight: 236lbs         Spoke with patient and reports feeling well at this time.  Denies fluid symptoms.    Corvue thoracic impedancenormal but suggesting possible fluid accumulation from 10/27/19 - 11/12/19.  Dr Shirlee Latch instructed her to take PRN Lasix x 3 days at 11/11/19 OV.   Prescribed:Furosemide 20 mgTake 1 tablet (20 mg) by mouthas needed.  Labs: 11/11/2019 Creatinine 0.91, BUN 10, Potassium 3.8, Sodium 142, GFR >60 A complete set of results can be found in Results Review.  Recommendations: No changes and encouraged to call if experiencing any fluid symptoms.  Follow-up plan: ICM clinic phone appointment on5/24/2021. 91 day device clinic remote transmission 01/06/2020.Office visit with Dr Shirlee Latch 03/23/2020.  Copy of ICM check sent to Dr.Klein  3 month ICM trend: 11/14/2019    1 Year ICM trend:       Karie Soda, RN 11/16/2019 9:46 AM

## 2019-11-25 ENCOUNTER — Other Ambulatory Visit: Payer: Self-pay

## 2019-11-25 ENCOUNTER — Ambulatory Visit (HOSPITAL_COMMUNITY)
Admission: RE | Admit: 2019-11-25 | Discharge: 2019-11-25 | Disposition: A | Discharge status: Home / Self Care | Payer: BC Managed Care – PPO | Source: Ambulatory Visit | Attending: Cardiology | Admitting: Cardiology

## 2019-11-25 DIAGNOSIS — I5022 Chronic systolic (congestive) heart failure: Secondary | ICD-10-CM

## 2019-11-25 LAB — BASIC METABOLIC PANEL
Anion gap: 9 (ref 5–15)
BUN: 12 mg/dL (ref 6–20)
CO2: 26 mmol/L (ref 22–32)
Calcium: 8.9 mg/dL (ref 8.9–10.3)
Chloride: 103 mmol/L (ref 98–111)
Creatinine, Ser: 0.96 mg/dL (ref 0.44–1.00)
GFR calc Af Amer: >60 mL/min (ref >60)
GFR calc non Af Amer: >60 mL/min (ref >60)
Glucose, Bld: 103 mg/dL — ABNORMAL HIGH (ref 70–99)
Potassium: 4 mmol/L (ref 3.5–5.1)
Sodium: 138 mmol/L (ref 135–145)

## 2019-12-03 ENCOUNTER — Other Ambulatory Visit: Payer: Self-pay | Admitting: Cardiology

## 2019-12-16 ENCOUNTER — Other Ambulatory Visit (HOSPITAL_COMMUNITY): Payer: Self-pay | Admitting: Cardiology

## 2019-12-19 ENCOUNTER — Ambulatory Visit (INDEPENDENT_AMBULATORY_CARE_PROVIDER_SITE_OTHER): Payer: BC Managed Care – PPO

## 2019-12-19 DIAGNOSIS — I5022 Chronic systolic (congestive) heart failure: Secondary | ICD-10-CM | POA: Diagnosis not present

## 2019-12-19 DIAGNOSIS — Z95810 Presence of automatic (implantable) cardiac defibrillator: Secondary | ICD-10-CM

## 2019-12-23 NOTE — Progress Notes (Signed)
EPIC Encounter for ICM Monitoring  Patient Name: Alison Howard is a 59 y.o. female Date: 12/23/2019 Primary Care Physican: Johny Blamer, MD Primary Cardiologist:[place] Electrophysiologist: Joycelyn Schmid Pacing: 95% 4/21/2021Weight: 449QPR         Spoke with patient and reports feeling well at this time.  Denies fluid symptoms.    Corvue thoracic impedancenormal.   Prescribed:Furosemide 20 mgTake 1 tablet (20 mg) by mouthas needed.  Labs: 11/11/2019 Creatinine 0.91, BUN 10, Potassium 3.8, Sodium 142, GFR >60 A complete set of results can be found in Results Review.  Recommendations: No changes and encouraged to call if experiencing any fluid symptoms.  Follow-up plan: ICM clinic phone appointment on6/28/2021. 91 day device clinic remote transmission6/05/2020.Office visit with Francis Dowse, PA on 01/03/2020 and with Dr Shirlee Latch 03/23/2020.  Copy of ICM check sent to Dr.Klein  3 month ICM trend: 12/19/2019    1 Year ICM trend:       Karie Soda, RN 12/23/2019 12:27 PM

## 2020-01-01 NOTE — Progress Notes (Signed)
Cardiology Office Note Date:  01/03/2020  Patient ID:  Alison Howard, Alison Howard [birth year], MRN [id number] PCP:  Johny Blamer, MD  Cardiologist:  Dr. Shirlee Latch EP: Dr. Graciela Husbands    Chief Complaint:  annual device visit  History of Present Illness: Alison Howard is a 59 y.o. 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  She comes in today to be seen for Dr. Graciela Husbands, last seen by him, May 2020 via tele health.  Discussed flecainide not ideal given CM, though was tolerating and had no CAD, was continued.  Most recently saw Dr. Shirlee Latch 11/11/19, early signs of volume OL and had her takx lasix daily for 3 days then resume PRN, started on empagliflozin 10 mg daily.  Also is followed by L.Short, RN w/ICM.  She is doing well.  No CP, palpitations or cardiac awareness.  No dizzy spells, near syncope or syncope. No rest SOB or changes in her exertional capacity.  She denies symptoms of PND or orthopnea.  She is on Flecainide 50mg  daily.  She states this after lengthy discussion with Dr. at her last visit, was reduced to daily dosing  She has felt no change on the daily dosing regime.    Device information: SJM CRT-D implanted 02/17/2018 + h/o appropriate therapy (ATP) for VT June 2015 AAD Flecainide for PVCs   Past Medical History:  Diagnosis Date  . Chronic systolic heart failure (HCC) 04/25/2013  . GERD (gastroesophageal reflux disease)   . Hypercholesteremia   . LBBB (left bundle branch block)   . Morbid obesity (HCC) 12/28/2014  . NICM (nonischemic cardiomyopathy) (HCC)    a. s/p STJ CRTD  . OSA (obstructive sleep apnea) 12/28/2014   Severe with AHI 27 events per hour on average and the highest AHI was 40 events per hour  . Ventricular tachycardia (HCC)    a. s/p appropriate ICD therapy    Past Surgical History:  Procedure Laterality Date  . BIV ICD GENERATOR CHANGEOUT N/A 02/17/2018   Procedure: BIV ICD GENERATOR  CHANGEOUT;  Surgeon: 02/19/2018, MD;  Location[facility] INVASIVE CV LAB;  Service: Cardiovascular;  Laterality: N/A;  . CARDIAC DEFIBRILLATOR PLACEMENT  2012   a. STJ CRTD implanted for NICM, CHF  . RIGHT/LEFT HEART CATH AND CORONARY ANGIOGRAPHY N/A 10/07/2018   Procedure: RIGHT/LEFT HEART CATH AND CORONARY ANGIOGRAPHY;  Surgeon: 12/07/2018, MD;  Location: [facility] INVASIVE CV LAB;  Service: Cardiovascular;  Laterality: N/A;  . TOTAL ABDOMINAL HYSTERECTOMY      Current Outpatient Medications  Medication Sig Dispense Refill  . bisoprolol (ZEBETA) 5 MG tablet TAKE 1 TABLET BY MOUTH TWICE A DAY 180 tablet 3  . citalopram (CELEXA) 40 MG tablet Take 40 mg by mouth daily.     [facility] ENTRESTO 97-103 MG TAKE 1 TABLET BY MOUTH TWICE A DAY 180 tablet 3  . eplerenone (INSPRA) 50 MG tablet TAKE 1 TABLET BY MOUTH EVERY DAY 90 tablet 3  . flecainide (TAMBOCOR) 50 MG tablet Take 50 mg by mouth daily.    . furosemide (LASIX) 20 MG tablet Take 1 tablet (20 mg total) by mouth as needed. 30 tablet 5  . pantoprazole (PROTONIX) 40 MG tablet Take 40 mg by mouth 2 (two) times daily.      No current facility-administered medications for this visit.    Allergies:   Ciprofloxacin hcl, Codeine, Morphine and related, Sulfa antibiotics, Zetia [ezetimibe], and Dapagliflozin   Social  History:  The patient  reports that she has never smoked. She has never used smokeless tobacco. She reports current alcohol use. She reports that she does not use drugs.   Family History:  The patient's family history includes Coronary artery disease in her mother; Heart attack in her mother; Heart disease in her father; Hyperlipidemia in her sister.  ROS:  Please see the history of present illness.  All other systems are reviewed and otherwise negative.   PHYSICAL EXAM:  VS:  BP 126/72   Pulse 75   Ht 5\' 2"  (1.575 m)   Wt 235 lb (106.6 kg)   BMI 42.98 kg/m  BMI: Body mass index is 42.98 kg/m. Well nourished, well developed, in no  acute distress  HEENT: normocephalic, atraumatic  Neck: no JVD, carotid bruits or masses Cardiac:  RRR; no significant murmurs, no rubs, or gallops Lungs:  CTA b/l, no wheezing, rhonchi or rales  Abd: soft, nontender, obese MS: no deformity or atrophy Ext: no edema  Skin: warm and dry, no rash Neuro:  No gross deficits appreciated Psych: euthymic mood, full affect  ICD site is stable, no tethering or discomfort   EKG:  Done today and reviewed by myself shows  SR/ Vpaced, PVCs PR 167ms, QRS 182ms, Qtc 595ms Stable intervals in comparison to prior EKG  ICD interrogation done today and reviewed by myself:  Battery and lead measurements are stable No arrhythmias or therapies BP  95%   - Echo (8/13): EF 40-45% - Echo (12/15): EF 25-30% - Echo (6/17): EF 35% - Echo (6/18): EF 40-45% - Echo (11/18): EF 35-40% - Echo (12/19): EF 25-30%, severe LV dilation, mild MR.  - RHC/LHC (3/20): no CAD.  Mean RA 4, PA 27/10 mean 16, mean PCWP 5, CI 2.37.  - CPX (3/20): peak VO2 16.9 (97% predicted), VE/VCO2 23, RER 1.14 => no significant HF limitation, primarily limited by deconditioning.  - Cardiac MRI (6/20): Very difficult study due to pacemaker artifact.  EF 34%, delayed enhancement images not interpretable.    - 6/18 holter 22% PVCs - 10/19 holter 3.1% PVCs     Recent Labs: 11/25/2019: BUN 12; Creatinine, Ser 0.96; Potassium 4.0; Sodium 138  11/11/2019: Cholesterol 284; HDL 70; LDL Cholesterol 185; Total CHOL/HDL Ratio 4.1; Triglycerides 145; VLDL 29   CrCl cannot be calculated (Patient's most recent lab result is older than the maximum 21 days allowed.).   Wt Readings from Last 3 Encounters:  01/03/20 235 lb (106.6 kg)  11/11/19 240 lb 3.2 oz (109 kg)  04/19/19 229 lb 9.6 oz (104.1 kg)     Other studies reviewed: Additional studies/records reviewed today include: summarized above  ASSESSMENT AND PLAN:  1. CRT-D     Intact function, no programming changes made  2.  NICM 3. Chronic CHF     On BB, entresto, Inspra, lasix     CorVue is above threshold     C/w Dr. Aundra Dubin and HF team   4. PVC's 5. VT     On flecainide daily dosing adjustemet per the pt, made at her last appt via Dr. Aundra Dubin.  I do not appreciate this by his note, though she is certain, med list seems to reflect this.     Stable intervals    Disposition: F/u with Q 3 mo remotes, and in clinic with EP in a year, sooner if needed  Current medicines are reviewed at length with the patient today.  The patient did not have any concerns regarding  medicines.  Norma Fredrickson, PA-C 01/03/2020 8:47 AM     [facility] [address] [place] [address] [phone] (office)  [phone] (fax)

## 2020-01-03 ENCOUNTER — Other Ambulatory Visit: Payer: Self-pay | Admitting: Cardiology

## 2020-01-03 ENCOUNTER — Ambulatory Visit (INDEPENDENT_AMBULATORY_CARE_PROVIDER_SITE_OTHER): Payer: BC Managed Care – PPO | Admitting: Physician Assistant

## 2020-01-03 ENCOUNTER — Other Ambulatory Visit: Payer: Self-pay

## 2020-01-03 VITALS — BP 126/72 | HR 75 | Ht 62.0 in | Wt 235.0 lb

## 2020-01-03 DIAGNOSIS — I429 Cardiomyopathy, unspecified: Secondary | ICD-10-CM | POA: Diagnosis not present

## 2020-01-03 DIAGNOSIS — Z5181 Encounter for therapeutic drug level monitoring: Secondary | ICD-10-CM

## 2020-01-03 DIAGNOSIS — Z79899 Other long term (current) drug therapy: Secondary | ICD-10-CM

## 2020-01-03 DIAGNOSIS — I493 Ventricular premature depolarization: Secondary | ICD-10-CM

## 2020-01-03 DIAGNOSIS — I428 Other cardiomyopathies: Secondary | ICD-10-CM

## 2020-01-03 DIAGNOSIS — I472 Ventricular tachycardia, unspecified: Secondary | ICD-10-CM

## 2020-01-03 DIAGNOSIS — Z95810 Presence of automatic (implantable) cardiac defibrillator: Secondary | ICD-10-CM

## 2020-01-03 DIAGNOSIS — I5022 Chronic systolic (congestive) heart failure: Secondary | ICD-10-CM | POA: Diagnosis not present

## 2020-01-03 NOTE — Patient Instructions (Addendum)
Medication Instructions:  Your physician recommends that you continue on your current medications as directed. Please refer to the Current Medication list given to you today.  *If you need a refill on your cardiac medications before your next appointment, please call your pharmacy*   Lab Work: NONE ORDERED  TODAY   If you have labs (blood work) drawn today and your tests are completely normal, you will receive your results only by: Marland Kitchen MyChart Message (if you have MyChart) OR . A paper copy in the mail If you have any lab test that is abnormal or we need to change your treatment, we will call you to review the results.   Testing/Procedures: NONE ORDERED  TODAY   Follow-Up: At [facility], you and your health needs are our priority.  As part of our continuing mission to provide you with exceptional heart care, we have created designated Provider Care Teams.  These Care Teams include your primary Cardiologist (physician) and Advanced Practice Providers (APPs -  Physician Assistants and Nurse Practitioners) who all work together to provide you with the care you need, when you need it.  We recommend signing up for the patient portal called "MyChart".  Sign up information is provided on this After Visit Summary.  MyChart is used to connect with patients for Virtual Visits (Telemedicine).  Patients are able to view lab/test results, encounter notes, upcoming appointments, etc.  Non-urgent messages can be sent to your provider as well.   To learn more about what you can do with MyChart, go to [email or web address].    Your next appointment:   1 year(s)  The format for your next appointment:   In Person  Provider:   You may see Dr. Graciela Husbands  Patient Request    Other Instructions

## 2020-01-04 NOTE — Addendum Note (Signed)
Addended by: Oleta Mouse on: 01/04/2020 02:23 PM   Modules accepted: Orders

## 2020-01-06 ENCOUNTER — Ambulatory Visit (INDEPENDENT_AMBULATORY_CARE_PROVIDER_SITE_OTHER): Payer: BC Managed Care – PPO | Admitting: *Deleted

## 2020-01-06 DIAGNOSIS — I5022 Chronic systolic (congestive) heart failure: Secondary | ICD-10-CM

## 2020-01-06 DIAGNOSIS — I429 Cardiomyopathy, unspecified: Secondary | ICD-10-CM

## 2020-01-06 LAB — CUP PACEART REMOTE DEVICE CHECK
Battery Remaining Longevity: 67 mo
Battery Remaining Percentage: 76 %
Battery Voltage: 2.98 V
Brady Statistic AP VP Percent: 5.6 %
Brady Statistic AP VS Percent: 1 %
Brady Statistic AS VP Percent: 90 %
Brady Statistic AS VS Percent: 1.2 %
Brady Statistic RA Percent Paced: 2.1 %
Date Time Interrogation Session: 20210611020019
HighPow Impedance: 64 Ohm
HighPow Impedance: 64 Ohm
Implantable Lead Implant Date: 20120529
Implantable Lead Implant Date: 20120529
Implantable Lead Implant Date: 20120529
Implantable Lead Location: 753858
Implantable Lead Location: 753859
Implantable Lead Location: 753860
Implantable Pulse Generator Implant Date: 20190724
Lead Channel Impedance Value: 1125 Ohm
Lead Channel Impedance Value: 390 Ohm
Lead Channel Impedance Value: 510 Ohm
Lead Channel Pacing Threshold Amplitude: 0.75 V
Lead Channel Pacing Threshold Amplitude: 1.125 V
Lead Channel Pacing Threshold Amplitude: 1.25 V
Lead Channel Pacing Threshold Pulse Width: 0.5 ms
Lead Channel Pacing Threshold Pulse Width: 0.5 ms
Lead Channel Pacing Threshold Pulse Width: 0.5 ms
Lead Channel Sensing Intrinsic Amplitude: 12 mV
Lead Channel Sensing Intrinsic Amplitude: 2.6 mV
Lead Channel Setting Pacing Amplitude: 2 V
Lead Channel Setting Pacing Amplitude: 2 V
Lead Channel Setting Pacing Amplitude: 2.25 V
Lead Channel Setting Pacing Pulse Width: 0.5 ms
Lead Channel Setting Pacing Pulse Width: 0.5 ms
Lead Channel Setting Sensing Sensitivity: 0.5 mV
Pulse Gen Serial Number: 9842506

## 2020-01-06 NOTE — Progress Notes (Signed)
Remote ICD transmission.   

## 2020-01-27 NOTE — Progress Notes (Signed)
No ICM remote transmission received for 01/23/2020 and next ICM transmission scheduled for 02/20/2020.   

## 2020-02-06 ENCOUNTER — Other Ambulatory Visit (HOSPITAL_COMMUNITY): Payer: Self-pay | Admitting: Cardiology

## 2020-02-09 ENCOUNTER — Other Ambulatory Visit: Payer: Self-pay

## 2020-02-09 MED ORDER — FLECAINIDE ACETATE 50 MG PO TABS: 50.0000 mg | ORAL_TABLET | Freq: Every day | ORAL | 3 refills | Status: DC

## 2020-02-20 ENCOUNTER — Ambulatory Visit: Payer: BC Managed Care – PPO

## 2020-02-24 ENCOUNTER — Telehealth: Payer: Self-pay

## 2020-02-24 NOTE — Telephone Encounter (Signed)
LMOVM for pt to send missed transmission. 

## 2020-02-27 NOTE — Progress Notes (Signed)
No ICM remote transmission received for 02/20/2020 and next ICM transmission scheduled for 03/26/2020.   

## 2020-03-14 IMAGING — MR MR CARDIA MORPHOLOGY WITHOUT AND WITH CONTRAST
20 of 22 series · 38 of 40 positions shown · IV contrast (Gadavist)
Comparison: none

CLINICAL DATA: Cardiomyopathy of uncertain etiology.

EXAM:
CARDIAC MRI
TECHNIQUE: The patient was scanned on a 1.5 Tesla GE magnet. A dedicated
cardiac coil was used. Functional imaging was done using Fiesta
sequences. [DATE], and 4 chamber views were done to assess for RWMA's.
Modified Harachi rule using a short axis stack was used to
calculate an ejection fraction on a dedicated work station using
Circle software. The patient received 8 cc of Gadavist. After 10
minutes inversion recovery sequences were used to assess for
infiltration and scar tissue.

[Series 6: bSSFP · oblique · 8.0mm · 1.61mm/px · 19 of 350 slices shown (1 of 4)]
[im 1/350]
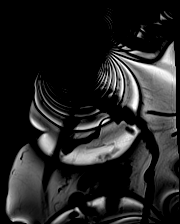
[im 20/350]
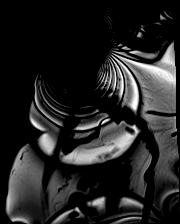
[im 39/350]
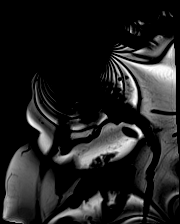
[im 59/350]
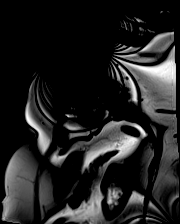
[im 78/350]
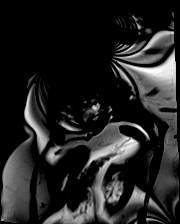
[im 97/350]
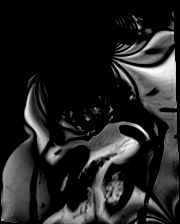
[im 117/350]
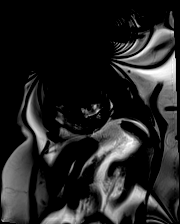
[im 136/350]
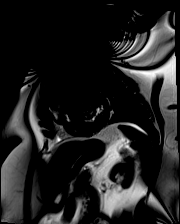
[im 156/350]
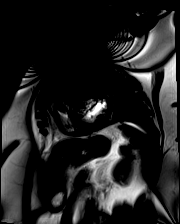
[im 175/350]
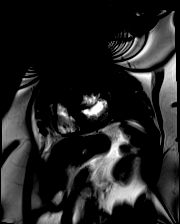
[im 194/350]
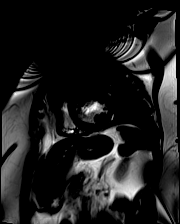
[im 214/350]
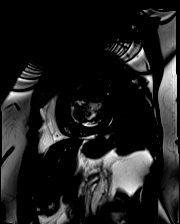
[im 233/350]
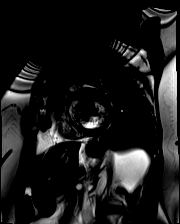
[im 253/350]
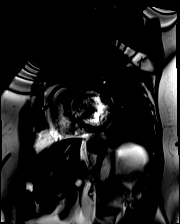
[im 272/350]
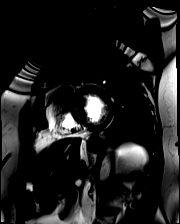
[im 291/350]
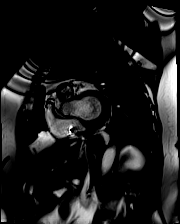
[im 311/350]
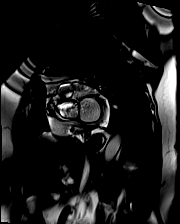
[im 330/350]
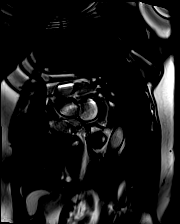
[im 350/350]
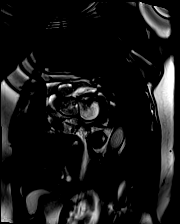

[Series 7: t2_stir_db_sax · oblique · 8.0mm · 1.73mm/px · 1 of 14 slices shown]
[im 1/14]
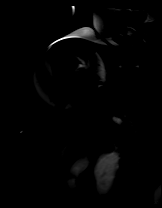

[Series 8: t2_stir_db_radial ((date)ch) · axial · 6.0mm · 1.73mm/px · 1 of 3 slices shown]
[im 1/3]
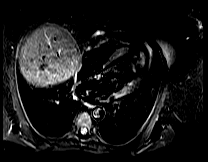

[Series 12: bSSFP · axial · 6.0mm · 1.41mm/px · 1 of 25 slices shown (2 of 4)]
[im 1/25]
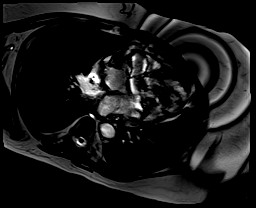

[Series 13: bSSFP · axial · 6.0mm · 1.41mm/px · 1 of 25 slices shown (3 of 4)]
[im 1/25]
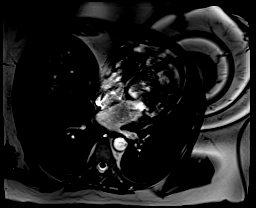

[Series 14: bSSFP · oblique · 6.0mm · 1.41mm/px · 1 of 25 slices shown (4 of 4)]
[im 1/25]
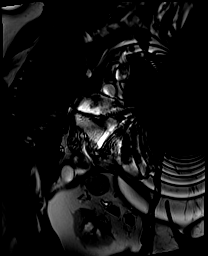

[Series 16: lge_single shot sa · oblique · 8.0mm · 1.98mm/px · 1 of 14 slices shown (1 of 2)]
[im 1/14]
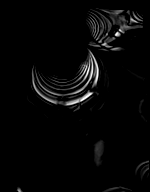

[Series 17: lge_single shot sa · oblique · 8.0mm · 1.98mm/px · 1 of 14 slices shown (2 of 2)]
[im 1/14]
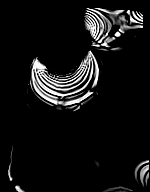

[Series 18: lge_single shot radial_mag · axial · 6.0mm · 1.98mm/px · 1 of 1 slices shown (1 of 2)]
[im 1/1]
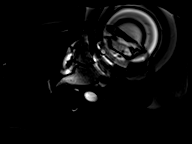

[Series 19: lge_single shot radial_psir · axial · 6.0mm · 1.98mm/px · 1 of 1 slices shown (1 of 2)]
[im 1/1]
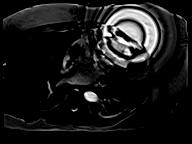

[Series 20: lge_single shot radial_mag · axial · 6.0mm · 1.98mm/px · 1 of 1 slices shown (2 of 2)]
[im 1/1]
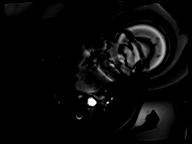

[Series 21: lge_single shot radial_psir · axial · 6.0mm · 1.98mm/px · 1 of 1 slices shown (2 of 2)]
[im 1/1]
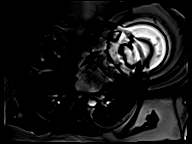

[Series 25: lge short axis_mag · oblique · 8.0mm · 1.61mm/px · 1 of 11 slices shown]
[im 1/11]
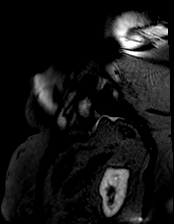

[Series 26: lge short axis_psir · oblique · 8.0mm · 1.61mm/px · 1 of 11 slices shown]
[im 1/11]
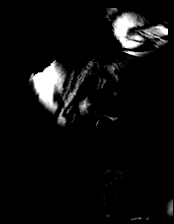

[Series 27: hla mde_mag · axial · 8.0mm · 1.61mm/px · 1 of 9 slices shown]
[im 1/9]
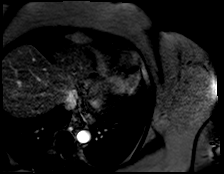

[Series 28: hla mde_psir · axial · 8.0mm · 1.61mm/px · 1 of 9 slices shown]
[im 1/9]
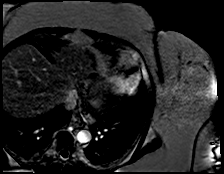

[Series 29: lge radial ((date)ch)_mag · axial · 6.0mm · 1.61mm/px · 1 of 1 slices shown (1 of 2)]
[im 1/1]
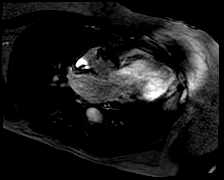

[Series 30: lge radial ((date)ch)_psir · axial · 6.0mm · 1.61mm/px · 1 of 1 slices shown (1 of 2)]
[im 1/1]
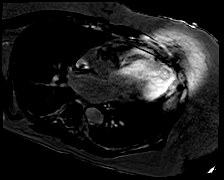

[Series 31: lge radial ((date)ch)_mag · axial · 6.0mm · 1.61mm/px · 1 of 1 slices shown (2 of 2)]
[im 1/1]
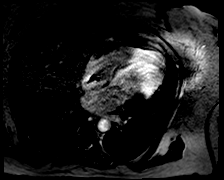

[Series 32: lge radial ((date)ch)_psir · axial · 6.0mm · 1.61mm/px · 1 of 1 slices shown (2 of 2)]
[im 1/1]
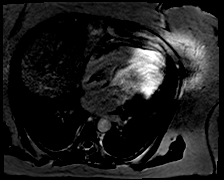

[38 of 40 positions shown; findings below may reference images not displayed]

FINDINGS: Limited images of the lung fields showed no gross abnormalities.

There was a pacemaker present that created significant significant
artifact. The right ventricle was poor visualized but probably has
normal systolic function. The left ventricle appeared normal in size
with normal wall thickness. LV EF calculated to 34% though images
were difficult, diffuse hypokinesis.

Delayed enhancement images were uninterpretable due to pacemaker
artifact.

Measurements:

LVEDV 161 mL

LVSV 55 mL

LVEF 34%
IMPRESSION: Very difficult images due to pacemaker artifact. LV normal in size
with EF 34%. Delayed enhancement images were not interpretable.

Jong Min Teloris

## 2020-03-16 NOTE — Progress Notes (Signed)
No remote transmission received.

## 2020-03-23 ENCOUNTER — Encounter (HOSPITAL_COMMUNITY): Payer: BC Managed Care – PPO | Admitting: Cardiology

## 2020-03-26 ENCOUNTER — Ambulatory Visit (INDEPENDENT_AMBULATORY_CARE_PROVIDER_SITE_OTHER): Payer: BC Managed Care – PPO

## 2020-03-26 DIAGNOSIS — I5022 Chronic systolic (congestive) heart failure: Secondary | ICD-10-CM | POA: Diagnosis not present

## 2020-03-26 DIAGNOSIS — Z95810 Presence of automatic (implantable) cardiac defibrillator: Secondary | ICD-10-CM

## 2020-03-27 NOTE — Progress Notes (Signed)
EPIC Encounter for ICM Monitoring  Patient Name: Alison Howard is a 59 y.o. female Date: 03/27/2020 Primary Care Physican: Johny Blamer, MD Primary Cardiologist:McLean Electrophysiologist: Joycelyn Schmid Pacing: 95% 8/31/2021Weight: [id number]  Spoke with patient and reports feeling well at this time. Denies fluid symptoms or dehydration symptoms.   Corvue thoracic impedancesuggesting possible dryness since 03/18/2020.   Prescribed:Furosemide 20 mgTake 1 tablet (20 mg) by mouthas needed.  Labs: 11/11/2019 Creatinine 0.91, BUN 10, Potassium 3.8, Sodium 142, GFR >60 A complete set of results can be found in Results Review.  Recommendations: Advised to slightly increase fluid intake x 1-2 day then return to 64 oz daily.  No changes and encouraged to call if experiencing any fluid symptoms.  Follow-up plan: ICM clinic phone appointment on10/10/2019. 91 day device clinic remote transmission9/04/2020.  Office visit with Dr McLean10/06/2020.  Copy of ICM check sent to Dr.Klein.   3 month ICM trend: 03/26/2020    1 Year ICM trend:       Karie Soda, RN 03/27/2020 2:57 PM

## 2020-04-06 ENCOUNTER — Ambulatory Visit (INDEPENDENT_AMBULATORY_CARE_PROVIDER_SITE_OTHER): Payer: BC Managed Care – PPO | Admitting: *Deleted

## 2020-04-06 DIAGNOSIS — I429 Cardiomyopathy, unspecified: Secondary | ICD-10-CM

## 2020-04-06 LAB — CUP PACEART REMOTE DEVICE CHECK
Battery Remaining Longevity: 65 mo
Battery Remaining Percentage: 73 %
Battery Voltage: 2.98 V
Brady Statistic AP VP Percent: 3.8 %
Brady Statistic AP VS Percent: 1 %
Brady Statistic AS VP Percent: 93 %
Brady Statistic AS VS Percent: 1 %
Brady Statistic RA Percent Paced: 1.6 %
Date Time Interrogation Session: 20210910020018
HighPow Impedance: 65 Ohm
HighPow Impedance: 65 Ohm
Implantable Lead Implant Date: 20120529
Implantable Lead Implant Date: 20120529
Implantable Lead Implant Date: 20120529
Implantable Lead Location: 753858
Implantable Lead Location: 753859
Implantable Lead Location: 753860
Implantable Pulse Generator Implant Date: 20190724
Lead Channel Impedance Value: 1100 Ohm
Lead Channel Impedance Value: 390 Ohm
Lead Channel Impedance Value: 590 Ohm
Lead Channel Pacing Threshold Amplitude: 0.75 V
Lead Channel Pacing Threshold Amplitude: 1.25 V
Lead Channel Pacing Threshold Amplitude: 1.875 V
Lead Channel Pacing Threshold Pulse Width: 0.5 ms
Lead Channel Pacing Threshold Pulse Width: 0.5 ms
Lead Channel Pacing Threshold Pulse Width: 0.5 ms
Lead Channel Sensing Intrinsic Amplitude: 11.9 mV
Lead Channel Sensing Intrinsic Amplitude: 2.9 mV
Lead Channel Setting Pacing Amplitude: 2 V
Lead Channel Setting Pacing Amplitude: 2.25 V
Lead Channel Setting Pacing Amplitude: 2.375
Lead Channel Setting Pacing Pulse Width: 0.5 ms
Lead Channel Setting Pacing Pulse Width: 0.5 ms
Lead Channel Setting Sensing Sensitivity: 0.5 mV
Pulse Gen Serial Number: 9842506

## 2020-04-06 NOTE — Progress Notes (Signed)
Remote ICD transmission.   

## 2020-04-30 ENCOUNTER — Ambulatory Visit (INDEPENDENT_AMBULATORY_CARE_PROVIDER_SITE_OTHER): Payer: BC Managed Care – PPO

## 2020-04-30 DIAGNOSIS — Z95810 Presence of automatic (implantable) cardiac defibrillator: Secondary | ICD-10-CM

## 2020-04-30 DIAGNOSIS — I5022 Chronic systolic (congestive) heart failure: Secondary | ICD-10-CM

## 2020-05-01 NOTE — Progress Notes (Signed)
EPIC Encounter for ICM Monitoring  Patient Name: Alison Howard is a 59 y.o. female Date: 05/01/2020 Primary Care Physican: Johny Blamer, MD Primary Cardiologist:McLean Electrophysiologist: Joycelyn Schmid Pacing: 97% 10/5/2021Weight: [id number]  Spoke with patient and reports feeling well at this time. Denies fluid symptoms.  Corvue thoracic impedancesuggesting normal fluid levels.   Prescribed:Furosemide 20 mgTake 1 tablet (20 mg) by mouthas needed.  Labs: 11/25/2019 Creatinine 0.96, BUN 12, Potassium 4.0, Sodium 138,GFR >60 11/11/2019 Creatinine 0.91, BUN 10, Potassium 3.8, Sodium 142, GFR >60 A complete set of results can be found in Results Review.  Recommendations: No changes and encouraged to call if experiencing any fluid symptoms.  Follow-up plan: ICM clinic phone appointment on11/02/2020. 91 day device clinic remote transmission12/04/2020.  EP/Cardiology next office visit: 05/08/2020 with Dr Shirlee Latch  Copy of ICM check sent to Dr.Klein.    3 month ICM trend: 04/30/2020    1 Year ICM trend:       Karie Soda, RN 05/01/2020 11:23 AM

## 2020-05-08 ENCOUNTER — Encounter (HOSPITAL_COMMUNITY): Payer: BC Managed Care – PPO | Admitting: Cardiology

## 2020-05-10 ENCOUNTER — Encounter (HOSPITAL_COMMUNITY): Payer: BC Managed Care – PPO | Admitting: Cardiology

## 2020-06-04 ENCOUNTER — Ambulatory Visit (INDEPENDENT_AMBULATORY_CARE_PROVIDER_SITE_OTHER): Payer: Self-pay

## 2020-06-04 DIAGNOSIS — I5022 Chronic systolic (congestive) heart failure: Secondary | ICD-10-CM

## 2020-06-04 DIAGNOSIS — Z95810 Presence of automatic (implantable) cardiac defibrillator: Secondary | ICD-10-CM

## 2020-06-08 NOTE — Progress Notes (Signed)
EPIC Encounter for ICM Monitoring  Patient Name: Alison Howard is a 59 y.o. female Date: 06/08/2020 Primary Care Physican: Johny Blamer, MD Primary Cardiologist:McLean Electrophysiologist: Joycelyn Schmid Pacing: 96% 10/5/2021Weight: [id number]  Spoke with patient and reports feeling well at this time. Denies fluid symptoms.  Corvue thoracic impedancesuggesting normal fluid levels.   Prescribed:Furosemide 20 mgTake 1 tablet (20 mg) by mouthas needed.  Labs: 11/25/2019 Creatinine 0.96, BUN 12, Potassium 4.0, Sodium 138,GFR >60 11/11/2019 Creatinine 0.91, BUN 10, Potassium 3.8, Sodium 142, GFR >60 A complete set of results can be found in Results Review.  Recommendations: No changes and encouraged to call if experiencing any fluid symptoms.  Follow-up plan: ICM clinic phone appointment on12/13/2021. 91 day device clinic remote transmission12/04/2020.  EP/Cardiology next office visit: None at this time  Copy of ICM check sent to Dr.Klein.    3 month ICM trend: 06/04/2020    1 Year ICM trend:       Karie Soda, RN 06/08/2020 2:29 PM

## 2020-06-14 ENCOUNTER — Encounter (HOSPITAL_COMMUNITY): Payer: Self-pay | Admitting: Cardiology

## 2020-07-06 ENCOUNTER — Ambulatory Visit (INDEPENDENT_AMBULATORY_CARE_PROVIDER_SITE_OTHER): Payer: Self-pay

## 2020-07-06 DIAGNOSIS — I429 Cardiomyopathy, unspecified: Secondary | ICD-10-CM

## 2020-07-07 LAB — CUP PACEART REMOTE DEVICE CHECK
Battery Remaining Longevity: 61 mo
Battery Remaining Percentage: 70 %
Battery Voltage: 2.96 V
Brady Statistic AP VP Percent: 4.2 %
Brady Statistic AP VS Percent: 1 %
Brady Statistic AS VP Percent: 92 %
Brady Statistic AS VS Percent: 1.3 %
Brady Statistic RA Percent Paced: 1.5 %
Date Time Interrogation Session: 20211210033335
HighPow Impedance: 61 Ohm
HighPow Impedance: 61 Ohm
Implantable Lead Implant Date: 20120529
Implantable Lead Implant Date: 20120529
Implantable Lead Implant Date: 20120529
Implantable Lead Location: 753858
Implantable Lead Location: 753859
Implantable Lead Location: 753860
Implantable Pulse Generator Implant Date: 20190724
Lead Channel Impedance Value: 1075 Ohm
Lead Channel Impedance Value: 410 Ohm
Lead Channel Impedance Value: 590 Ohm
Lead Channel Pacing Threshold Amplitude: 0.75 V
Lead Channel Pacing Threshold Amplitude: 1.25 V
Lead Channel Pacing Threshold Amplitude: 1.625 V
Lead Channel Pacing Threshold Pulse Width: 0.5 ms
Lead Channel Pacing Threshold Pulse Width: 0.5 ms
Lead Channel Pacing Threshold Pulse Width: 0.5 ms
Lead Channel Sensing Intrinsic Amplitude: 12 mV
Lead Channel Sensing Intrinsic Amplitude: 2.3 mV
Lead Channel Setting Pacing Amplitude: 2 V
Lead Channel Setting Pacing Amplitude: 2.125
Lead Channel Setting Pacing Amplitude: 2.25 V
Lead Channel Setting Pacing Pulse Width: 0.5 ms
Lead Channel Setting Pacing Pulse Width: 0.5 ms
Lead Channel Setting Sensing Sensitivity: 0.5 mV
Pulse Gen Serial Number: 9842506

## 2020-07-09 ENCOUNTER — Ambulatory Visit (INDEPENDENT_AMBULATORY_CARE_PROVIDER_SITE_OTHER): Payer: Self-pay

## 2020-07-09 DIAGNOSIS — I5022 Chronic systolic (congestive) heart failure: Secondary | ICD-10-CM

## 2020-07-09 DIAGNOSIS — Z95810 Presence of automatic (implantable) cardiac defibrillator: Secondary | ICD-10-CM

## 2020-07-11 NOTE — Progress Notes (Signed)
EPIC Encounter for ICM Monitoring  Patient Name: Alison Howard is a 59 y.o. female Date: 07/11/2020 Primary Care Physican: Johny Blamer, MD Primary Cardiologist:McLean Electrophysiologist: Joycelyn Schmid Pacing: 96% 10/5/2021Weight: [id number]  Spoke with patient and reports feeling well at this time.  Denies fluid symptoms.  Her mother passed away last week.  Patient's last name has been changed to [facility].   Corvue thoracic impedancesuggestingnormal fluid levels.  Prescribed:Furosemide 20 mgTake 1 tablet (20 mg) by mouthas needed.  Labs: 11/25/2019 Creatinine 0.96, BUN 12, Potassium 4.0, Sodium 138,GFR >60 11/11/2019 Creatinine 0.91, BUN 10, Potassium 3.8, Sodium 142, GFR >60 A complete set of results can be found in Results Review.  Recommendations: No changes and encouraged to call if experiencing any fluid symptoms.  Follow-up plan: ICM clinic phone appointment on2/07/2020. 91 day device clinic remote transmission3/05/2021.  EP/Cardiology next office visit:  Recall 01/02/2021 with Dr Graciela Husbands  Copy of ICM check sent to Dr.Klein.  3 month ICM trend: 07/09/2020    1 Year ICM trend:       Karie Soda, RN 07/11/2020 11:56 AM

## 2020-07-18 NOTE — Progress Notes (Signed)
Remote ICD transmission.   

## 2020-08-28 ENCOUNTER — Ambulatory Visit (INDEPENDENT_AMBULATORY_CARE_PROVIDER_SITE_OTHER): Payer: Self-pay

## 2020-08-28 DIAGNOSIS — Z95810 Presence of automatic (implantable) cardiac defibrillator: Secondary | ICD-10-CM

## 2020-08-28 DIAGNOSIS — I5022 Chronic systolic (congestive) heart failure: Secondary | ICD-10-CM

## 2020-08-31 NOTE — Progress Notes (Signed)
EPIC Encounter for ICM Monitoring  Patient Name: Alison Howard is a 60 y.o. female Date: 08/31/2020 Primary Care Physican: Johny Blamer, MD Primary Cardiologist:McLean Electrophysiologist: Joycelyn Schmid Pacing: 96% 2/4/2022Weight: [id number]  Spoke with patient and reports feeling well at this time.  Denies fluid symptoms.  .  Patient's last name has been changed to [facility].   Corvue thoracic impedancenormal but was suggesting possible fluid accumulation from 07/24/20 - 07/31/20 and 08/09/20 - 08/21/20.  Prescribed:Furosemide 20 mgTake 1 tablet (20 mg) by mouthas needed.  Labs: 11/25/2019 Creatinine 0.96, BUN 12, Potassium 4.0, Sodium 138,GFR >60 11/11/2019 Creatinine 0.91, BUN 10, Potassium 3.8, Sodium 142, GFR >60 A complete set of results can be found in Results Review.  Recommendations:No changes and encouraged to call if experiencing any fluid symptoms.  Follow-up plan: ICM clinic phone appointment on3/01/2021. 91 day device clinic remote transmission3/05/2021.  EP/Cardiology next office visit: 09/19/2020 with Dr Shirlee Latch.  Recall 01/02/2021 with Dr Graciela Husbands  Copy of ICM check sent to Dr.Klein.  3 month ICM trend: 08/28/2020.    1 Year ICM trend:       Karie Soda, RN 08/31/2020 9:12 AM

## 2020-09-19 ENCOUNTER — Encounter (HOSPITAL_COMMUNITY): Payer: Self-pay | Admitting: Cardiology

## 2020-09-19 ENCOUNTER — Ambulatory Visit (HOSPITAL_COMMUNITY)
Admission: RE | Admit: 2020-09-19 | Discharge: 2020-09-19 | Disposition: A | Discharge status: Home / Self Care | Payer: BC Managed Care – PPO | Source: Ambulatory Visit | Attending: Cardiology | Admitting: Cardiology

## 2020-09-19 ENCOUNTER — Other Ambulatory Visit: Payer: Self-pay

## 2020-09-19 VITALS — BP 98/60 | HR 74 | Wt 247.4 lb

## 2020-09-19 DIAGNOSIS — Z79899 Other long term (current) drug therapy: Secondary | ICD-10-CM | POA: Insufficient documentation

## 2020-09-19 DIAGNOSIS — I493 Ventricular premature depolarization: Secondary | ICD-10-CM | POA: Diagnosis not present

## 2020-09-19 DIAGNOSIS — R42 Dizziness and giddiness: Secondary | ICD-10-CM | POA: Diagnosis not present

## 2020-09-19 DIAGNOSIS — I428 Other cardiomyopathies: Secondary | ICD-10-CM | POA: Insufficient documentation

## 2020-09-19 DIAGNOSIS — R0602 Shortness of breath: Secondary | ICD-10-CM | POA: Diagnosis present

## 2020-09-19 DIAGNOSIS — Z8249 Family history of ischemic heart disease and other diseases of the circulatory system: Secondary | ICD-10-CM | POA: Insufficient documentation

## 2020-09-19 DIAGNOSIS — Z950 Presence of cardiac pacemaker: Secondary | ICD-10-CM | POA: Insufficient documentation

## 2020-09-19 DIAGNOSIS — I5022 Chronic systolic (congestive) heart failure: Secondary | ICD-10-CM | POA: Diagnosis not present

## 2020-09-19 DIAGNOSIS — Z6841 Body Mass Index (BMI) 40.0 and over, adult: Secondary | ICD-10-CM | POA: Insufficient documentation

## 2020-09-19 DIAGNOSIS — E785 Hyperlipidemia, unspecified: Secondary | ICD-10-CM | POA: Insufficient documentation

## 2020-09-19 HISTORY — DX: Heart failure, unspecified: I50.9

## 2020-09-19 LAB — LIPID PANEL
Cholesterol: 281 mg/dL — ABNORMAL HIGH (ref 0–200)
HDL: 77 mg/dL (ref >40)
LDL Cholesterol: 173 mg/dL — ABNORMAL HIGH (ref 0–99)
Total CHOL/HDL Ratio: 3.6 RATIO
Triglycerides: 157 mg/dL — ABNORMAL HIGH (ref <150)
VLDL: 31 mg/dL (ref 0–40)

## 2020-09-19 LAB — BASIC METABOLIC PANEL
Anion gap: 10 (ref 5–15)
BUN: 10 mg/dL (ref 6–20)
CO2: 25 mmol/L (ref 22–32)
Calcium: 9.1 mg/dL (ref 8.9–10.3)
Chloride: 102 mmol/L (ref 98–111)
Creatinine, Ser: 0.86 mg/dL (ref 0.44–1.00)
GFR, Estimated: >60 mL/min (ref >60)
Glucose, Bld: 108 mg/dL — ABNORMAL HIGH (ref 70–99)
Potassium: 4.2 mmol/L (ref 3.5–5.1)
Sodium: 137 mmol/L (ref 135–145)

## 2020-09-19 NOTE — Patient Instructions (Addendum)
Labs done today. We will contact you only if your labs are abnormal.  No medication changes were made. Please continue all current medications as prescribed.  Your physician recommends that you schedule a follow-up appointment in: 3 months with an echo prior to your exam  Your physician has requested that you have an echocardiogram. Echocardiography is a painless test that uses sound waves to create images of your heart. It provides your doctor with information about the size and shape of your heart and how well your heart's chambers and valves are working. This procedure takes approximately one hour. There are no restrictions for this procedure.  You have been referred to the [facility]. They will contact you to schedule an appointment.  If you have any questions or concerns before your next appointment please send [place] a message through [place] or call our office at [phone].    TO LEAVE A MESSAGE FOR THE NURSE SELECT OPTION 2, PLEASE LEAVE A MESSAGE INCLUDING: . YOUR NAME . DATE OF BIRTH . CALL BACK NUMBER . REASON FOR CALL**this is important as we prioritize the call backs  YOU WILL RECEIVE A CALL BACK THE SAME DAY AS LONG AS YOU CALL BEFORE 4:00 PM   Do the following things EVERYDAY: 1) Weigh yourself in the morning before breakfast. Write it down and keep it in a log. 2) Take your medicines as prescribed 3) Eat low salt foods--Limit salt (sodium) to 2000 mg per day.  4) Stay as active as you can everyday 5) Limit all fluids for the day to less than 2 liters   At [facility], you and your health needs are our priority. As part of our continuing mission to provide you with exceptional heart care, we have created designated Provider Care Teams. These Care Teams include your primary Cardiologist (physician) and Advanced Practice Providers (APPs- Physician Assistants and Nurse Practitioners) who all work together to provide you with the care you need, when you  need it.   You may see any of the following providers on your designated Care Team at your next follow up: Marland Kitchen Dr Arvilla Meres . Dr Marca Ancona . Tonye Becket, NP . Robbie Lis, PA . Karle Plumber, PharmD   Please be sure to bring in all your medications bottles to every appointment.

## 2020-09-22 NOTE — Progress Notes (Signed)
ID:  Alison Howard, DOB January 15, 1961, MRN [id number]   Provider location: [facility] Type of Visit: Established patient  PCP:  Alison Blamer, MD  Cardiologist:  Dr. Shirlee Howard   History of Present Illness: Alison Howard is a 60 y.o. female who has a history of CHF and VT and was referred by Dr. Graciela Howard for evaluation of CHF. She has a long history of cardiomyopathy, dating back to at least 2013.  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  She has had VT in the past terminated by [employer].  She has also had up to 22% PVCs in the past, down to 3.1% on 10/19 holter. She is currently on flecainide to suppress the PVCs.  Echo in 12/19 showed severe dilation of the LV with EF 25-30%.   RHC/LHC in 3/20 showed normal filling pressures, CI 2.37, and no significant CAD. CPX in 3/20 showed no significant HF limitation, main problem appeared to be deconditioning.   She returns today for followup of CHF.  She had to stop [place] due to itching (resolved off [place]) and [place] is not affordable for her.  Weight is up about 7 lbs.  No dyspnea walking on flat ground.  She is short of breath walking up 1 flight of stairs.  Occasional lightheadedness if she stands too fast.  She is off cholesterol meds due to muscle cramps with statins and Zetia.   Alison Howard device interrogation: 96% BiV pacing, stable thoracic impedance.   Labs (10/19): K 4.3, creatinine 0.88 Labs (3/20): K 4.3, creatinine 0.88 Labs (6/20): K 4.4, creatinine 1.02 Labs (11/20): K 4.5 creatinine 0.88, hgb 14.4 Labs (9/21): K 4, creatinine 0.96, LDL 185, HDL 70  ECG (personally reviewed): a-paced, BiV paced  PMH:  1. Chronic systolic CHF: Nonischemic cardiomyopathy. Alison Howard BiV ICD.  - Echo (8/13): EF 40-45% - Echo (12/15): EF 25-30% - Echo (6/17): EF 35% - Echo (6/18): EF 40-45% - Echo (11/18): EF 35-40% - Echo (12/19): EF 25-30%, severe LV dilation, mild MR.  - RHC/LHC (3/20): no CAD.  Mean RA 4, PA 27/10 mean 16, mean PCWP  5, CI 2.37.  - CPX (3/20): peak VO2 16.9 (97% predicted), VE/VCO2 23, RER 1.14 => no significant HF limitation, primarily limited by deconditioning.  - Cardiac MRI (6/20): Very difficult study due to pacemaker artifact.  EF 34%, delayed enhancement images not interpretable.  2. PVCs/VT:  - 6/18 holter 22% PVCs - 10/19 holter 3.1% PVCs 3. OSA 4. Hyperlipidemia: Unable to tolerate statins, Zetia.  5. Obesity  Current Outpatient Medications  Medication Sig Dispense Refill  . bisoprolol (ZEBETA) 5 MG tablet TAKE 1 TABLET BY MOUTH TWICE A DAY 180 tablet 3  . citalopram (CELEXA) 40 MG tablet Take 40 mg by mouth daily.     Marland Kitchen ENTRESTO 97-103 MG TAKE 1 TABLET BY MOUTH TWICE A DAY 180 tablet 3  . eplerenone (INSPRA) 50 MG tablet TAKE 1 TABLET BY MOUTH EVERY DAY 90 tablet 3  . flecainide (TAMBOCOR) 50 MG tablet Take 1 tablet (50 mg total) by mouth daily. 90 tablet 3  . furosemide (LASIX) 20 MG tablet Take 1 tablet (20 mg total) by mouth as needed. 30 tablet 5  . pantoprazole (PROTONIX) 40 MG tablet Take 40 mg by mouth 2 (two) times daily.      No current facility-administered medications for this encounter.    Allergies:   Ciprofloxacin hcl, Codeine, Morphine and related, Sulfa antibiotics, Zetia [ezetimibe],  and Dapagliflozin   Social History:  The patient  reports that she has never smoked. She has never used smokeless tobacco. She reports current alcohol use. She reports that she does not use drugs.   Family History:  The patient's family history includes Coronary artery disease in her mother; Heart attack in her mother; Heart disease in her father; Hyperlipidemia in her sister.   ROS:  Please see the history of present illness.   All other systems are personally reviewed and negative.   Exam:   BP 98/60   Pulse 74   Wt 112.2 kg (247 lb 6.4 oz)   SpO2 96%   BMI 45.25 kg/m  General: NAD Neck: No JVD, no thyromegaly or thyroid nodule.  Lungs: Clear to auscultation bilaterally with  normal respiratory effort. CV: Nondisplaced PMI.  Heart regular S1/S2, no S3/S4, no murmur.  No peripheral edema.  No carotid bruit.  Normal pedal pulses.  Abdomen: Soft, nontender, no hepatosplenomegaly, no distention.  Skin: Intact without lesions or rashes.  Neurologic: Alert and oriented x 3.  Psych: Normal affect. Extremities: No clubbing or cyanosis.  HEENT: Normal.   Recent Labs: 09/19/2020: BUN 10; Creatinine, Ser 0.86; Potassium 4.2; Sodium 137  Personally reviewed   Wt Readings from Last 3 Encounters:  09/19/20 112.2 kg (247 lb 6.4 oz)  01/03/20 106.6 kg (235 lb)  11/11/19 109 kg (240 lb 3.2 oz)     ASSESSMENT AND PLAN:  1. Chronic systolic CHF: Long history of nonischemic cardiomyopathy.  Most recent echo in 10/19 with severe LV dilation and EF 25-30%.  Has had frequent PVCs in the past, but most recent holter in 10/19 showed PVC count down to 3.1% on flecainide.  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 RHC/LHC in 3/20 with no significant CAD, normal feeling pressures, and preserved cardiac output.  CPX showed deconditioning but no significant HF limitation. Cardiac MRI in 6/20 was a very difficult study due to pacemaker artifact, LV EF 34% but LGE was not interpretable.  NYHA class II symptoms.  She is not volume overloaded by exam or Corvue.   - Continue bisoprolol 5 mg bid.  - Continue Entresto 97/103 bid.  - Continue eplerenone 50 mg daily.  - She was unable to tolerate dapagliflozin due to itching and her insurance does not cover empagliflozin.  - I will repeat echo at followup.  - BMET today.   2. PVCs: Count down to 3.1% with holter in 10/19 but EF remains 25-30% by echo. Suspect not primarily a PVC-mediated CMP therefore.  PVCs likely are a consequence of the cardiomyopathy.  - She is on flecainide for PVC suppression as well as bisoprolol.  Not ideal with structure heart disease but probably ok if no coronary disease on cath.  3. Obesity: Continue efforts at diet/exercise for  weight loss.  4. Hyperlipidemia: Markedly high LDL.  She cannot tolerate statins or Zetia.  I will refer her to lipid clinic for consideration of Repatha.   Followup in 3 months with echo.     Signed, Marca Ancona, MD  09/22/2020  [facility] [address] [facility] [place] [address] [phone] (office) [phone] (fax)

## 2020-09-25 ENCOUNTER — Telehealth (HOSPITAL_COMMUNITY): Payer: Self-pay

## 2020-09-25 DIAGNOSIS — E7800 Pure hypercholesterolemia, unspecified: Secondary | ICD-10-CM

## 2020-09-25 NOTE — Telephone Encounter (Signed)
-----   Message from Laurey Morale, MD sent at 09/23/2020  9:56 PM EST ----- Refer to lipid clinic for Repatha

## 2020-09-25 NOTE — Telephone Encounter (Signed)
Pt aware of results and rec to lipid clinic. Verbalized understanding.

## 2020-10-01 ENCOUNTER — Ambulatory Visit (INDEPENDENT_AMBULATORY_CARE_PROVIDER_SITE_OTHER): Payer: BC Managed Care – PPO

## 2020-10-01 DIAGNOSIS — Z95810 Presence of automatic (implantable) cardiac defibrillator: Secondary | ICD-10-CM

## 2020-10-01 DIAGNOSIS — I5022 Chronic systolic (congestive) heart failure: Secondary | ICD-10-CM | POA: Diagnosis not present

## 2020-10-03 ENCOUNTER — Telehealth: Payer: Self-pay

## 2020-10-03 NOTE — Progress Notes (Signed)
EPIC Encounter for ICM Monitoring  Patient Name: Alison Howard is a 60 y.o. female Date: 10/03/2020 Primary Care Physican: Johny Blamer, MD Primary Cardiologist:[facility] Electrophysiologist: Joycelyn Schmid Pacing: 96% 2/4/2022Weight: 238lbs  Attempted call to patient and unable to reach.  Left detailed message per DPR regarding transmission. Transmission reviewed.    Corvue thoracic impedancesuggesting dryness since 09/25/2020 but trending back toward baseline.  Prescribed:Furosemide 20 mgTake 1 tablet (20 mg) by mouthas needed.  Labs: 09/19/2020 Creatinine 0.86, BUN 10, Potassium 4.2, Sodium 137, GFR >60 11/25/2019 Creatinine 0.96, BUN 12, Potassium 4.0, Sodium 138,GFR >60 11/11/2019 Creatinine 0.91, BUN 10, Potassium 3.8, Sodium 142, GFR >60 A complete set of results can be found in Results Review.  Recommendations:Left voice mail with ICM number and encouraged to call if experiencing any fluid symptoms.  Follow-up plan: ICM clinic phone appointment on4/05/2021. 91 day device clinic remote transmission3/05/2021.  EP/Cardiology next office visit: 12/25/2020 with Dr Shirlee Latch. Recall 01/02/2021 with Dr Graciela Husbands  Copy of ICM check sent to Dr.Klein.  3 month ICM trend: 10/01/2020.    1 Year ICM trend:       Karie Soda, RN 10/03/2020 2:50 PM

## 2020-10-03 NOTE — Telephone Encounter (Signed)
Remote ICM transmission received.  Attempted call to patient regarding ICM remote transmission and left detailed message per DPR.  Advised to return call for any fluid symptoms or questions. Next ICM remote transmission scheduled 11/05/2020.     

## 2020-10-05 ENCOUNTER — Ambulatory Visit (INDEPENDENT_AMBULATORY_CARE_PROVIDER_SITE_OTHER): Payer: BC Managed Care – PPO

## 2020-10-05 DIAGNOSIS — I429 Cardiomyopathy, unspecified: Secondary | ICD-10-CM

## 2020-10-08 LAB — CUP PACEART REMOTE DEVICE CHECK
Battery Remaining Longevity: 60 mo
Battery Remaining Percentage: 67 %
Battery Voltage: 2.96 V
Brady Statistic AP VP Percent: 3.8 %
Brady Statistic AP VS Percent: 1 %
Brady Statistic AS VP Percent: 92 %
Brady Statistic AS VS Percent: 1.2 %
Brady Statistic RA Percent Paced: 1.5 %
Date Time Interrogation Session: 20220311020017
HighPow Impedance: 63 Ohm
HighPow Impedance: 63 Ohm
Implantable Lead Implant Date: 20120529
Implantable Lead Implant Date: 20120529
Implantable Lead Implant Date: 20120529
Implantable Lead Location: 753858
Implantable Lead Location: 753859
Implantable Lead Location: 753860
Implantable Pulse Generator Implant Date: 20190724
Lead Channel Impedance Value: 1100 Ohm
Lead Channel Impedance Value: 410 Ohm
Lead Channel Impedance Value: 590 Ohm
Lead Channel Pacing Threshold Amplitude: 0.75 V
Lead Channel Pacing Threshold Amplitude: 1.25 V
Lead Channel Pacing Threshold Amplitude: 1.75 V
Lead Channel Pacing Threshold Pulse Width: 0.5 ms
Lead Channel Pacing Threshold Pulse Width: 0.5 ms
Lead Channel Pacing Threshold Pulse Width: 0.5 ms
Lead Channel Sensing Intrinsic Amplitude: 12 mV
Lead Channel Sensing Intrinsic Amplitude: 2.3 mV
Lead Channel Setting Pacing Amplitude: 2 V
Lead Channel Setting Pacing Amplitude: 2.25 V
Lead Channel Setting Pacing Amplitude: 2.25 V
Lead Channel Setting Pacing Pulse Width: 0.5 ms
Lead Channel Setting Pacing Pulse Width: 0.5 ms
Lead Channel Setting Sensing Sensitivity: 0.5 mV
Pulse Gen Serial Number: 9842506

## 2020-10-09 ENCOUNTER — Ambulatory Visit: Payer: BC Managed Care – PPO

## 2020-10-12 NOTE — Progress Notes (Signed)
Remote ICD transmission.   

## 2020-10-23 NOTE — Progress Notes (Signed)
Patient ID: Alasia Enge                 DOB: 1961/07/06                    MRN: [id number]     HPI: Ever Halberg is a 60 y.o. female patient referred to lipid clinic by Dr. Shirlee Latch. PMH is significant for CHF and VT and was referred by Dr. Graciela Husbands for evaluation of CHF. She has a long history of cardiomyopathy, dating back to at least 2013. 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 She has had VT in the past terminated by [employer]. She has also had up to 22% PVCs in the past, down to 3.1% on 10/19 holter. She is currently on flecainide to suppress the PVCs. Echo in 12/19 showed severe dilation of the LV with EF 25-30%.  RHC/LHC in 3/20 showed normal filling pressures, CI 2.37, and no significant CAD. CPX in 3/20 showed no significant HF limitation, main problem appeared to be deconditioning.    Last visit with Dr. Shirlee Latch 09/19/20, patient was off cholesterol medications due to muscle cramps with statins and ezetimibe. Referred to pharmacy clinic for consideration of PCSK9i.   Today, patient reports she has tried every statin in the past and experienced myalgias. Reports this with simvastatin 40 mg daily, atorvastatin, and rosuvastatin. Has tried as low as 1.25 mg of rosuvastatin and still experienced these symptoms. Also endorses myalgias with ezetimibe (+rash) and red yeast rice. Reports similar problem with Welchol. She reports history of hyperlipidemia in mother, father, and sister, and her mother and father both had stents placed in their 33s. Evaluated for familial hypercholesterolemia, negative for physical signs, Dutch score is 1. Patient's 10 year ASCVD risk score is less than 3%. She hates needles and does not want to take any medications that are injectable.   Current Medications: No lipid lowering therapy Intolerances: Ezetimibe (rash, myalgias), simvastatin 40 mg (myalgias), rosuvastatin, atorvastatin, Welchol Risk Factors: OSA, obesity LDL goal: <100 mg/dL  Diet:  -Low sodium, low fat, gluten free,  FODMAP diet (has bad IBS-takes 2 immodium per day) -Low fat cheese, gluten free crackers, some vegetables, turkey/chicken, bananas, grapes  Family History: The patient's family history includes Coronary artery disease in her mother; Heart attack in her mother (early 77s; had stents placed); Heart disease in her father (30s); Hyperlipidemia in her sister.   Social History: The patient  reports that she has never smoked. She has never used smokeless tobacco. She reports current alcohol use. She reports that she does not use drugs.   Labs: 09/19/20: TC 281, TG 157, HDL 77, LDL 173 (no lipid lowering therapy) 11/11/19: TC 284, TG 145, HDL 70, LDL 185 (no lipid lowering therapy)  Past Medical History:  Diagnosis Date  . CHF (congestive heart failure) (HCC)   . Chronic systolic heart failure (HCC) 04/25/2013  . GERD (gastroesophageal reflux disease)   . Hypercholesteremia   . LBBB (left bundle branch block)   . Morbid obesity (HCC) 12/28/2014  . NICM (nonischemic cardiomyopathy) (HCC)    a. s/p STJ CRTD  . OSA (obstructive sleep apnea) 12/28/2014   Severe with AHI 27 events per hour on average and the highest AHI was 40 events per hour  . Ventricular tachycardia (HCC)    a. s/p appropriate ICD therapy    Current Outpatient Medications on File Prior to Visit  Medication Sig Dispense Refill  . bisoprolol (ZEBETA) 5 MG tablet TAKE 1 TABLET BY MOUTH  TWICE A DAY 180 tablet 3  . citalopram (CELEXA) 40 MG tablet Take 40 mg by mouth daily.     Marland Kitchen ENTRESTO 97-103 MG TAKE 1 TABLET BY MOUTH TWICE A DAY 180 tablet 3  . eplerenone (INSPRA) 50 MG tablet TAKE 1 TABLET BY MOUTH EVERY DAY 90 tablet 3  . flecainide (TAMBOCOR) 50 MG tablet Take 1 tablet (50 mg total) by mouth daily. 90 tablet 3  . furosemide (LASIX) 20 MG tablet Take 1 tablet (20 mg total) by mouth as needed. 30 tablet 5  . pantoprazole (PROTONIX) 40 MG tablet Take 40 mg by mouth 2 (two) times daily.      No current facility-administered  medications on file prior to visit.    Allergies  Allergen Reactions  . Ciprofloxacin Hcl Anaphylaxis  . Codeine Anaphylaxis and Nausea And Vomiting  . Morphine And Related Anaphylaxis  . Sulfa Antibiotics Anaphylaxis  . Zetia [Ezetimibe] Rash    Rash and muscle aches  . Dapagliflozin Hives    Assessment/Plan:  1. Hyperlipidemia - Most recent LDL of 173 mg/dL is elevated above goal <100 mg/dL. Suspect a familial component given multiple family members with hyperlipidemia/ASCVD events however Dutch score is only 1, negative for familial hypercholesterolemia. 10 year ASCVD risk score is < 3%. Patient has tried and is intolerant to multiple statins, even at a very low dose of rosuvastatin. Also intolerant to ezetimibe. Discussed PCSK9 inhibitors, however patient is opposed to any injectable therapy due to her significant fear of needles. Discussed Leqvio to see if she would be willing to come in and have someone else give her an injection, however she does not wish to pursue this either (coverage would also be difficult since she's primary prevention). Discussed Nexletol which patient would be willing to try, however her insurance does not cover the med well and it's ~$1200 for a 3 month supply. Patient is willing to re-try Welchol to see if she can tolerate it, since we are limited in our options. She prefers tablets over the powder. Will start Welchol 3750 mg once daily with food. Educated patient to take other medications 1 hour before or 4 hours after Welchol. She will plan to take this with lunch to avoid drug interactions. Will call patient in a few weeks to see how she is tolerating Welchol. She is scheduled for 3 month fasting lipid panel.     Pervis Hocking, PharmD PGY1 Pharmacy Resident 10/24/2020 10:32 AM

## 2020-10-24 ENCOUNTER — Other Ambulatory Visit: Payer: Self-pay

## 2020-10-24 ENCOUNTER — Ambulatory Visit (INDEPENDENT_AMBULATORY_CARE_PROVIDER_SITE_OTHER): Payer: BC Managed Care – PPO | Admitting: Student-PharmD

## 2020-10-24 DIAGNOSIS — E782 Mixed hyperlipidemia: Secondary | ICD-10-CM

## 2020-10-24 DIAGNOSIS — E785 Hyperlipidemia, unspecified: Secondary | ICD-10-CM | POA: Insufficient documentation

## 2020-10-24 DIAGNOSIS — E7800 Pure hypercholesterolemia, unspecified: Secondary | ICD-10-CM | POA: Diagnosis not present

## 2020-10-24 MED ORDER — COLESEVELAM HCL 625 MG PO TABS: 3750.0000 mg | ORAL_TABLET | Freq: Every day | ORAL | 11 refills | Status: DC

## 2020-10-24 MED ORDER — NEXLETOL 180 MG PO TABS: 180.0000 mg | ORAL_TABLET | Freq: Every day | ORAL | 3 refills | Status: DC

## 2020-10-24 NOTE — Patient Instructions (Addendum)
Nice to see you today!  Keep up the good work with diet and exercise. Aim for a diet full of vegetables, fruit and lean meats (chicken, [place], fish). Try to limit carbs (bread, pasta, sugar, rice) and red meat consumption.  Your goal LDL is less than 100 mg/dL, you're currently at 342 mg/dL  Medication Changes: Begin Welchol 3750 mg (6 tablets) once daily with food at lunch. Take your other medications at least 1 hour before or 4 hours after Welchol.   We will call you in 4 weeks to see how you are tolerating this.   Please give [place] a call at [phone] with any questions or concerns.  We will get follow up lab work (fasting) in 3 months  We will try to see if we can get Nexletol approved by your insurance and keep you updated.

## 2020-10-31 ENCOUNTER — Encounter (HOSPITAL_COMMUNITY): Payer: Self-pay | Admitting: *Deleted

## 2020-10-31 NOTE — Progress Notes (Signed)
Pt's work accommodation form completed and signed by Dr Shirlee Latch, faxed back to [employer] at [phone], copy mailed to pt for her records

## 2020-11-05 ENCOUNTER — Ambulatory Visit (INDEPENDENT_AMBULATORY_CARE_PROVIDER_SITE_OTHER): Payer: BC Managed Care – PPO

## 2020-11-05 DIAGNOSIS — I5022 Chronic systolic (congestive) heart failure: Secondary | ICD-10-CM | POA: Diagnosis not present

## 2020-11-05 DIAGNOSIS — Z95810 Presence of automatic (implantable) cardiac defibrillator: Secondary | ICD-10-CM | POA: Diagnosis not present

## 2020-11-06 ENCOUNTER — Telehealth: Payer: Self-pay

## 2020-11-06 NOTE — Progress Notes (Signed)
EPIC Encounter for ICM Monitoring  Patient Name: Alison Howard is a 60 y.o. female Date: 11/06/2020 Primary Care Physican: Johny Blamer, MD Primary Cardiologist:[facility] Electrophysiologist: Joycelyn Schmid Pacing: 96% 2/4/2022Weight: 238lbs  Attempted call to patient and unable to reach.  Left detailed message per DPR regarding transmission. Transmission reviewed.   Corvue thoracic impedancesuggesting normal fluid levels.  Prescribed:Furosemide 20 mgTake 1 tablet (20 mg) by mouthas needed.  Labs: 09/19/2020 Creatinine 0.86, BUN 10, Potassium 4.2, Sodium 137, GFR >60 11/25/2019 Creatinine 0.96, BUN 12, Potassium 4.0, Sodium 138,GFR >60 11/11/2019 Creatinine 0.91, BUN 10, Potassium 3.8, Sodium 142, GFR >60 A complete set of results can be found in Results Review.  Recommendations: Left voice mail with ICM number and encouraged to call if experiencing any fluid symptoms.  Follow-up plan: ICM clinic phone appointment on6/07/2020. 91 day device clinic remote transmission6/04/2021.  EP/Cardiology next office visit: 12/25/2020 with Dr Shirlee Latch.01/25/2021 with Dr Graciela Husbands  Copy of ICM check sent to Dr.Klein.   3 month ICM trend: 11/05/2020.    1 Year ICM trend:       Karie Soda, RN 11/06/2020 11:01 AM

## 2020-11-06 NOTE — Telephone Encounter (Signed)
Remote ICM transmission received.  Attempted call to patient regarding ICM remote transmission and left detailed message per DPR.  Advised to return call for any fluid symptoms or questions.  

## 2020-11-21 ENCOUNTER — Telehealth: Payer: Self-pay | Admitting: Pharmacist

## 2020-11-21 NOTE — Telephone Encounter (Signed)
Called pt to follow up with Welchol tolerability. She reports tolerating medication surprisingly well. Will keep follow up labs scheduled in another 2 months.

## 2020-12-25 ENCOUNTER — Ambulatory Visit (HOSPITAL_BASED_OUTPATIENT_CLINIC_OR_DEPARTMENT_OTHER)
Admission: RE | Admit: 2020-12-25 | Discharge: 2020-12-25 | Disposition: A | Discharge status: Home / Self Care | Payer: BC Managed Care – PPO | Source: Ambulatory Visit | Attending: Cardiology | Admitting: Cardiology

## 2020-12-25 ENCOUNTER — Other Ambulatory Visit: Payer: Self-pay

## 2020-12-25 ENCOUNTER — Ambulatory Visit (HOSPITAL_COMMUNITY)
Admission: RE | Admit: 2020-12-25 | Discharge: 2020-12-25 | Disposition: A | Discharge status: Home / Self Care | Payer: BC Managed Care – PPO | Source: Ambulatory Visit | Attending: Cardiology | Admitting: Cardiology

## 2020-12-25 ENCOUNTER — Encounter (HOSPITAL_COMMUNITY): Payer: Self-pay | Admitting: Cardiology

## 2020-12-25 VITALS — BP 124/80 | HR 69 | Wt 244.0 lb

## 2020-12-25 DIAGNOSIS — I428 Other cardiomyopathies: Secondary | ICD-10-CM | POA: Diagnosis not present

## 2020-12-25 DIAGNOSIS — I493 Ventricular premature depolarization: Secondary | ICD-10-CM | POA: Insufficient documentation

## 2020-12-25 DIAGNOSIS — E785 Hyperlipidemia, unspecified: Secondary | ICD-10-CM | POA: Diagnosis not present

## 2020-12-25 DIAGNOSIS — Z6841 Body Mass Index (BMI) 40.0 and over, adult: Secondary | ICD-10-CM | POA: Insufficient documentation

## 2020-12-25 DIAGNOSIS — E669 Obesity, unspecified: Secondary | ICD-10-CM | POA: Insufficient documentation

## 2020-12-25 DIAGNOSIS — I5022 Chronic systolic (congestive) heart failure: Secondary | ICD-10-CM

## 2020-12-25 DIAGNOSIS — Z950 Presence of cardiac pacemaker: Secondary | ICD-10-CM | POA: Insufficient documentation

## 2020-12-25 DIAGNOSIS — Z8249 Family history of ischemic heart disease and other diseases of the circulatory system: Secondary | ICD-10-CM | POA: Insufficient documentation

## 2020-12-25 DIAGNOSIS — Z79899 Other long term (current) drug therapy: Secondary | ICD-10-CM | POA: Diagnosis not present

## 2020-12-25 DIAGNOSIS — G4733 Obstructive sleep apnea (adult) (pediatric): Secondary | ICD-10-CM | POA: Insufficient documentation

## 2020-12-25 LAB — BASIC METABOLIC PANEL
Anion gap: 10 (ref 5–15)
BUN: 10 mg/dL (ref 6–20)
CO2: 24 mmol/L (ref 22–32)
Calcium: 8.9 mg/dL (ref 8.9–10.3)
Chloride: 103 mmol/L (ref 98–111)
Creatinine, Ser: 0.92 mg/dL (ref 0.44–1.00)
GFR, Estimated: >60 mL/min (ref >60)
Glucose, Bld: 98 mg/dL (ref 70–99)
Potassium: 4 mmol/L (ref 3.5–5.1)
Sodium: 137 mmol/L (ref 135–145)

## 2020-12-25 LAB — ECHOCARDIOGRAM COMPLETE
Area-P 1/2: 2.95 cm2
S' Lateral: 4.5 cm

## 2020-12-25 NOTE — Patient Instructions (Addendum)
EKG done today.  Labs done today. We will contact you only if your labs are abnormal.  No medication changes were made. Please continue all current medications as prescribed.  Your physician recommends that you schedule a follow-up appointment in: 3 months for a lab only appointment and in 6 months with Dr. Shirlee Latch. Please contact our office in October to schedule a November appointment.    If you have any questions or concerns before your next appointment please send [place] a message through [place] or call our office at [phone].    TO LEAVE A MESSAGE FOR THE NURSE SELECT OPTION 2, PLEASE LEAVE A MESSAGE INCLUDING: . YOUR NAME . DATE OF BIRTH . CALL BACK NUMBER . REASON FOR CALL**this is important as we prioritize the call backs  YOU WILL RECEIVE A CALL BACK THE SAME DAY AS LONG AS YOU CALL BEFORE 4:00 PM   Do the following things EVERYDAY: 1) Weigh yourself in the morning before breakfast. Write it down and keep it in a log. 2) Take your medicines as prescribed 3) Eat low salt foods--Limit salt (sodium) to 2000 mg per day.  4) Stay as active as you can everyday 5) Limit all fluids for the day to less than 2 liters   At [facility], you and your health needs are our priority. As part of our continuing mission to provide you with exceptional heart care, we have created designated Provider Care Teams. These Care Teams include your primary Cardiologist (physician) and Advanced Practice Providers (APPs- Physician Assistants and Nurse Practitioners) who all work together to provide you with the care you need, when you need it.   You may see any of the following providers on your designated Care Team at your next follow up: Marland Kitchen Dr Arvilla Meres . Dr Marca Ancona . Tonye Becket, NP . Robbie Lis, PA . Karle Plumber, PharmD   Please be sure to bring in all your medications bottles to every appointment.

## 2020-12-25 NOTE — Progress Notes (Signed)
  Echocardiogram 2D Echocardiogram has been performed.  Delcie Roch 12/25/2020, 10:35 AM

## 2020-12-26 ENCOUNTER — Ambulatory Visit (INDEPENDENT_AMBULATORY_CARE_PROVIDER_SITE_OTHER): Payer: BC Managed Care – PPO

## 2020-12-26 DIAGNOSIS — Z95810 Presence of automatic (implantable) cardiac defibrillator: Secondary | ICD-10-CM

## 2020-12-26 DIAGNOSIS — I5022 Chronic systolic (congestive) heart failure: Secondary | ICD-10-CM

## 2020-12-26 NOTE — Progress Notes (Signed)
EPIC Encounter for ICM Monitoring  Patient Name: Alison Howard is a 60 y.o. female Date: 12/26/2020 Primary Care Physican: Johny Blamer, MD Primary Cardiologist:[facility] Electrophysiologist: Joycelyn Schmid Pacing: 96% 5/31/2022Office Weight: 234lbs   Transmission reviewed.  Patient seen by Dr Shirlee Latch on 5/31 and Corvue was considered stable per OV note.  Corvue thoracic impedancesuggesting possible fluid accumulation starting 12/23/2020 and was addressed at 12/25/2020 OV with Dr Shirlee Latch.  Prescribed:Furosemide 20 mgTake 1 tablet (20 mg) by mouthas needed.  Labs: 12/25/2020 Creatinine 0.92, BUN 10, Potassium 4.0, Sodium 137, GFR >60 09/19/2020 Creatinine 0.86, BUN 10, Potassium 4.2, Sodium 137, GFR >60 A complete set of results can be found in Results Review.  Recommendations:   Recommendations provided at 12/25/2020 OV with Dr Shirlee Latch.  Follow-up plan: ICM clinic phone appointment on7/05/2021. 91 day device clinic remote transmission6/04/2021.  EP/Cardiology next office visit: 01/25/2021 with Dr Graciela Husbands  Copy of ICM check sent to Dr.Klein.  3 month ICM trend: 12/25/2020.    1 Year ICM trend:       Karie Soda, RN 12/26/2020 8:23 AM

## 2020-12-26 NOTE — Progress Notes (Signed)
ID:  [name], DOB 1961-05-08, MRN [id number]   Provider location: [facility] Type of Visit: Established patient  PCP:  Johny Blamer, MD  Cardiologist:  Dr. Shirlee Latch   History of Present Illness: Alison Howard is a 59 y.o. female who has a history of CHF and VT and was referred by Dr. Graciela Husbands for evaluation of CHF. She has a long history of cardiomyopathy, dating back to at least 2013.  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  She has had VT in the past terminated by ATP.  She has also had up to 22% PVCs in the past, down to 3.1% on 10/19 holter. She is currently on flecainide to suppress the PVCs.  Echo in 12/19 showed severe dilation of the LV with EF 25-30%.   RHC/LHC in 3/20 showed normal filling pressures, CI 2.37, and no significant CAD. CPX in 3/20 showed no significant HF limitation, main problem appeared to be deconditioning.  Echo was done today, showing EF 35-40% with basal inferoseptal, basal anteroseptal, and basal inferior akinesis.   She returns today for followup of CHF.  She has been stable symptomatically.  No dyspnea with normal ADLs.  No orthopnea/PND. No lightheadedness.  No chest pain. Weight is down 5 lbs. No palpitations.   St Jude device interrogation: 96% BiV pacing, stable thoracic impedance.   Labs (10/19): K 4.3, creatinine 0.88 Labs (3/20): K 4.3, creatinine 0.88 Labs (6/20): K 4.4, creatinine 1.02 Labs (11/20): K 4.5 creatinine 0.88, hgb 14.4 Labs (9/21): K 4, creatinine 0.96, LDL 185, HDL 70 Labs (2/22): LDL 173, K 4.2, creatinine 0.86  ECG (personally reviewed): NSR, BiV paced.   PMH:  1. Chronic systolic CHF: Nonischemic cardiomyopathy. St Jude BiV ICD.  - Echo (8/13): EF 40-45% - Echo (12/15): EF 25-30% - Echo (6/17): EF 35% - Echo (6/18): EF 40-45% - Echo (11/18): EF 35-40% - Echo (12/19): EF 25-30%, severe LV dilation, mild MR.  - RHC/LHC (3/20): no CAD.  Mean RA 4, PA 27/10 mean 16, mean PCWP 5, CI 2.37.  - CPX (3/20):  peak VO2 16.9 (97% predicted), VE/VCO2 23, RER 1.14 => no significant HF limitation, primarily limited by deconditioning.  - Cardiac MRI (6/20): Very difficult study due to pacemaker artifact.  EF 34%, delayed enhancement images not interpretable.  - Echo (5/22): EF 35-40% with basal inferoseptal, basal anteroseptal, and basal inferior akinesis.  2. PVCs/VT:  - 6/18 holter 22% PVCs - 10/19 holter 3.1% PVCs 3. OSA 4. Hyperlipidemia: Unable to tolerate statins, Zetia.  5. Obesity  Current Outpatient Medications  Medication Sig Dispense Refill  . bisoprolol (ZEBETA) 5 MG tablet TAKE 1 TABLET BY MOUTH TWICE A DAY 180 tablet 3  . citalopram (CELEXA) 40 MG tablet Take 40 mg by mouth daily.     . colesevelam (WELCHOL) 625 MG tablet Take 6 tablets (3,750 mg total) by mouth daily with lunch. 180 tablet 11  . ENTRESTO 97-103 MG TAKE 1 TABLET BY MOUTH TWICE A DAY 180 tablet 3  . eplerenone (INSPRA) 50 MG tablet TAKE 1 TABLET BY MOUTH EVERY DAY 90 tablet 3  . flecainide (TAMBOCOR) 50 MG tablet Take 1 tablet (50 mg total) by mouth daily. 90 tablet 3  . furosemide (LASIX) 20 MG tablet Take 1 tablet (20 mg total) by mouth as needed. 30 tablet 5  . pantoprazole (PROTONIX) 40 MG tablet Take 40 mg by mouth 2 (two) times daily.      No  current facility-administered medications for this encounter.    Allergies:   Ciprofloxacin hcl, Codeine, Morphine and related, Sulfa antibiotics, Zetia [ezetimibe], and Dapagliflozin   Social History:  The patient  reports that she has never smoked. She has never used smokeless tobacco. She reports current alcohol use. She reports that she does not use drugs.   Family History:  The patient's family history includes Coronary artery disease in her mother; Heart attack in her mother; Heart disease in her father; Hyperlipidemia in her sister.   ROS:  Please see the history of present illness.   All other systems are personally reviewed and negative.   Exam:   BP 124/80    Pulse 69   Wt 110.7 kg (244 lb)   SpO2 96%   BMI 44.63 kg/m  General: NAD Neck: No JVD, no thyromegaly or thyroid nodule.  Lungs: Clear to auscultation bilaterally with normal respiratory effort. CV: Nondisplaced PMI.  Heart regular S1/S2, no S3/S4, no murmur.  No peripheral edema.  No carotid bruit.  Normal pedal pulses.  Abdomen: Soft, nontender, no hepatosplenomegaly, no distention.  Skin: Intact without lesions or rashes.  Neurologic: Alert and oriented x 3.  Psych: Normal affect. Extremities: No clubbing or cyanosis.  HEENT: Normal.   Recent Labs: 12/25/2020: BUN 10; Creatinine, Ser 0.92; Potassium 4.0; Sodium 137  Personally reviewed   Wt Readings from Last 3 Encounters:  12/25/20 110.7 kg (244 lb)  09/19/20 112.2 kg (247 lb 6.4 oz)  01/03/20 106.6 kg (235 lb)     ASSESSMENT AND PLAN:  1. Chronic systolic CHF: Long history of nonischemic cardiomyopathy.  Most recent echo in 10/19 with severe LV dilation and EF 25-30%.  Has had frequent PVCs in the past, but most recent holter in 10/19 showed PVC count down to 3.1% on flecainide.  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 RHC/LHC in 3/20 with no significant CAD, normal feeling pressures, and preserved cardiac output.  CPX showed deconditioning but no significant HF limitation. Cardiac MRI in 6/20 was a very difficult study due to pacemaker artifact, LV EF 34% but LGE was not interpretable.  Echo today showed EF 35-40% with basal inferoseptal, basal anteroseptal, and basal inferior akinesis.  NYHA class II symptoms.  She is not volume overloaded by exam or Corvue.   - Continue bisoprolol 5 mg bid.  - Continue Entresto 97/103 bid.  - Continue eplerenone 50 mg daily.  - She was unable to tolerate dapagliflozin due to itching and her insurance does not cover empagliflozin.  - BMET today.   2. PVCs: Count down to 3.1% with holter in 10/19 but EF remains 25-30% by echo. Suspect not primarily a PVC-mediated CMP therefore.  PVCs likely are a  consequence of the cardiomyopathy.  - She is on flecainide for PVC suppression as well as bisoprolol.  Not ideal with structure heart disease but probably ok with no coronary disease on cath.  3. Obesity: Continue efforts at diet/exercise for weight loss.  4. Hyperlipidemia: Markedly high LDL.  She cannot tolerate statins or Zetia.  She will not use any injectable meds.  She has started [employer].  - Lipids in about a month.   BMET 3 months, followup 6 months.      Signed, Alison Ancona, MD  12/26/2020  [facility] [address] [facility] [place] [address] [phone] (office) [phone] (fax)

## 2021-01-04 ENCOUNTER — Ambulatory Visit (INDEPENDENT_AMBULATORY_CARE_PROVIDER_SITE_OTHER): Payer: Self-pay

## 2021-01-04 DIAGNOSIS — I5022 Chronic systolic (congestive) heart failure: Secondary | ICD-10-CM

## 2021-01-04 LAB — CUP PACEART REMOTE DEVICE CHECK
Battery Remaining Longevity: 55 mo
Battery Remaining Percentage: 65 %
Battery Voltage: 2.96 V
Brady Statistic AP VP Percent: 3.7 %
Brady Statistic AP VS Percent: 1 %
Brady Statistic AS VP Percent: 92 %
Brady Statistic AS VS Percent: 1.4 %
Brady Statistic RA Percent Paced: 1.3 %
Date Time Interrogation Session: 20220610033757
HighPow Impedance: 59 Ohm
HighPow Impedance: 59 Ohm
Implantable Lead Implant Date: 20120529
Implantable Lead Implant Date: 20120529
Implantable Lead Implant Date: 20120529
Implantable Lead Location: 753858
Implantable Lead Location: 753859
Implantable Lead Location: 753860
Implantable Pulse Generator Implant Date: 20190724
Lead Channel Impedance Value: 1075 Ohm
Lead Channel Impedance Value: 360 Ohm
Lead Channel Impedance Value: 510 Ohm
Lead Channel Pacing Threshold Amplitude: 0.75 V
Lead Channel Pacing Threshold Amplitude: 1.25 V
Lead Channel Pacing Threshold Amplitude: 1.75 V
Lead Channel Pacing Threshold Pulse Width: 0.5 ms
Lead Channel Pacing Threshold Pulse Width: 0.5 ms
Lead Channel Pacing Threshold Pulse Width: 0.5 ms
Lead Channel Sensing Intrinsic Amplitude: 11.4 mV
Lead Channel Sensing Intrinsic Amplitude: 2.9 mV
Lead Channel Setting Pacing Amplitude: 2 V
Lead Channel Setting Pacing Amplitude: 2.25 V
Lead Channel Setting Pacing Amplitude: 2.25 V
Lead Channel Setting Pacing Pulse Width: 0.5 ms
Lead Channel Setting Pacing Pulse Width: 0.5 ms
Lead Channel Setting Sensing Sensitivity: 0.5 mV
Pulse Gen Serial Number: 9842506

## 2021-01-22 NOTE — Progress Notes (Signed)
Remote ICD transmission.   

## 2021-01-24 ENCOUNTER — Other Ambulatory Visit: Payer: BC Managed Care – PPO

## 2021-01-24 NOTE — Progress Notes (Signed)
Patient ID: Alison Howard, female   DOB: 1960-07-30, 60 y.o.   MRN: [id number]      Patient Care Team: Johny Blamer, MD as PCP - General (Family Medicine) Laurey Morale, MD as PCP - Advanced Heart Failure (Cardiology)   HPI  Alison Howard is a 60 y.o. female Seen in followup for CRT-D St Jude  (2013; gen change 2019 )implanted at [facility] for cardiomyopathy presumed nonischemic of >5 yrs duration;   She was seen in 6/15 following appropriate therapies for VT-ATP      She is tolerating flecainide which she takes for PVC  Today, the patient denies chest pain or peripheral edema.  There have been no palpitations, lightheadednes or syncope.  Complains of dyspnea on exertion which is very limiting.  Lots of lassitude.     DATE TEST EF%   8/13  Echo  40-45%   12/15    Echo   25-30 %   6/17    Echo   35%   6/18 Echo   40-45%   11/18 Echo  35-40%   12/19 Echo 25-30%   6/20 Cardiac MRI    5/22 Echo  35-40%    DATE PVC %   8/17 11 Multiple morphologies  6/18 22  Multiple morphologies  10/18 5.2 From device   7/19 >10%   6/22 2.6% From device   Date Cr K Hgb  5/17  0.8 4.1   6/18  0.78 4.2   10/19 0.88 4.3   10/20 1.00 4.3   4/21 0.96 4.0   5/22 0.92 4.0    Past Medical History:  Diagnosis Date   CHF (congestive heart failure) (HCC)    Chronic systolic heart failure (HCC) 04/25/2013   GERD (gastroesophageal reflux disease)    Hypercholesteremia    LBBB (left bundle branch block)    Morbid obesity (HCC) 12/28/2014   NICM (nonischemic cardiomyopathy) (HCC)    a. s/p STJ CRTD   OSA (obstructive sleep apnea) 12/28/2014   Severe with AHI 27 events per hour on average and the highest AHI was 40 events per hour   Ventricular tachycardia (HCC)    a. s/p appropriate ICD therapy    Past Surgical History:  Procedure Laterality Date   BIV ICD GENERATOR CHANGEOUT N/A 02/17/2018   Procedure: BIV ICD GENERATOR CHANGEOUT;  Surgeon: Duke Salvia, MD;  Location: [facility] INVASIVE CV  LAB;  Service: Cardiovascular;  Laterality: N/A;   CARDIAC DEFIBRILLATOR PLACEMENT  2012   a. STJ CRTD implanted for NICM, CHF   RIGHT/LEFT HEART CATH AND CORONARY ANGIOGRAPHY N/A 10/07/2018   Procedure: RIGHT/LEFT HEART CATH AND CORONARY ANGIOGRAPHY;  Surgeon: Laurey Morale, MD;  Location: [facility] INVASIVE CV LAB;  Service: Cardiovascular;  Laterality: N/A;   TOTAL ABDOMINAL HYSTERECTOMY      Current Outpatient Medications  Medication Sig Dispense Refill   bisoprolol (ZEBETA) 5 MG tablet TAKE 1 TABLET BY MOUTH TWICE A DAY 180 tablet 3   citalopram (CELEXA) 40 MG tablet Take 40 mg by mouth daily.      colesevelam (WELCHOL) 625 MG tablet Take 6 tablets (3,750 mg total) by mouth daily with lunch. 180 tablet 11   ENTRESTO 97-103 MG TAKE 1 TABLET BY MOUTH TWICE A DAY 180 tablet 3   eplerenone (INSPRA) 50 MG tablet TAKE 1 TABLET BY MOUTH EVERY DAY 90 tablet 3   flecainide (TAMBOCOR) 50 MG tablet Take 1 tablet (50 mg total) by mouth daily. 90 tablet 3   furosemide (LASIX)  20 MG tablet Take 1 tablet (20 mg total) by mouth as needed. 30 tablet 5   pantoprazole (PROTONIX) 40 MG tablet Take 40 mg by mouth 2 (two) times daily.      No current facility-administered medications for this visit.    Allergies  Allergen Reactions   Ciprofloxacin Hcl Anaphylaxis   Codeine Anaphylaxis and Nausea And Vomiting   Morphine And Related Anaphylaxis   Sulfa Antibiotics Anaphylaxis   Zetia [Ezetimibe] Rash    Rash and muscle aches   Dapagliflozin Hives    Review of Systems negative except from HPI and PMH  Physical Exam: BP 110/72   Pulse 82   Ht 5\' 2"  (1.575 m)   Wt 240 lb (108.9 kg)   SpO2 96%   BMI 43.90 kg/m  Well developed and Morbidly obese  in no acute distress HENT normal Neck supple with JVP-flat Lungs Clear Device pocket well healed; without hematoma or erythema.  There is no tethering  Regular rate and rhythm, no  gallop No murmur Abd-soft with active BS No Clubbing cyanosis  edema Skin-warm and dry A & Oriented  Grossly normal sensory and motor function  ECG: Sinus at 82 Intervals 07/10/1945 QRS duration upright lead V1 QR in lead I Isolated PVC   Assessment and  Plan  Nonischemic cardiomyopathy  Congestive heart failure- chronic-systolic  PVCs  Morbidly obese   PVCs are much improved.  We will continue her on her flecainide but will increase from 50 mg once a day to 50 mg twice a day.  For her cardiomyopathy continue her Entresto 97/103 eplerenone 50 and bisoprolol 5.  Not on an SGLT2 secondary to  allergy  Functional status is a significantly impaired.  Her volume status is stable so we will continue her on furosemide 20 mg as needed in addition to her eplerenone.  We had a significant discussion however about her weight gain over the last couple of years and the impact on her exercise.  She has joint issues which make it hard so suggested that she do nonweightbearing exercise either with a exercise bicycle or water aerobics.  We also discussed dietary options.

## 2021-01-25 ENCOUNTER — Ambulatory Visit (INDEPENDENT_AMBULATORY_CARE_PROVIDER_SITE_OTHER): Payer: BC Managed Care – PPO | Admitting: Internal Medicine

## 2021-01-25 ENCOUNTER — Encounter: Payer: Self-pay | Admitting: Internal Medicine

## 2021-01-25 ENCOUNTER — Other Ambulatory Visit: Payer: BC Managed Care – PPO | Admitting: *Deleted

## 2021-01-25 ENCOUNTER — Other Ambulatory Visit: Payer: Self-pay

## 2021-01-25 ENCOUNTER — Other Ambulatory Visit (HOSPITAL_COMMUNITY): Payer: Self-pay | Admitting: Cardiology

## 2021-01-25 VITALS — BP 110/72 | HR 82 | Ht 62.0 in | Wt 240.0 lb

## 2021-01-25 DIAGNOSIS — Z95810 Presence of automatic (implantable) cardiac defibrillator: Secondary | ICD-10-CM

## 2021-01-25 DIAGNOSIS — I5022 Chronic systolic (congestive) heart failure: Secondary | ICD-10-CM

## 2021-01-25 DIAGNOSIS — I429 Cardiomyopathy, unspecified: Secondary | ICD-10-CM | POA: Diagnosis not present

## 2021-01-25 DIAGNOSIS — E7800 Pure hypercholesterolemia, unspecified: Secondary | ICD-10-CM

## 2021-01-25 LAB — LIPID PANEL
Chol/HDL Ratio: 3.9 ratio (ref 0.0–4.4)
Cholesterol, Total: 279 mg/dL — ABNORMAL HIGH (ref 100–199)
HDL: 72 mg/dL (ref >39)
LDL Chol Calc (NIH): 178 mg/dL — ABNORMAL HIGH (ref 0–99)
Triglycerides: 160 mg/dL — ABNORMAL HIGH (ref 0–149)
VLDL Cholesterol Cal: 29 mg/dL (ref 5–40)

## 2021-01-25 LAB — HEPATIC FUNCTION PANEL
ALT: 13 IU/L (ref 0–32)
AST: 17 IU/L (ref 0–40)
Albumin: 4.4 g/dL (ref 3.8–4.9)
Alkaline Phosphatase: 80 IU/L (ref 44–121)
Bilirubin Total: 0.7 mg/dL (ref 0.0–1.2)
Bilirubin, Direct: 0.16 mg/dL (ref 0.00–0.40)
Total Protein: 7 g/dL (ref 6.0–8.5)

## 2021-01-25 MED ORDER — FLECAINIDE ACETATE 50 MG PO TABS: 50.0000 mg | ORAL_TABLET | Freq: Two times a day (BID) | ORAL | 3 refills | Status: DC

## 2021-01-25 NOTE — Patient Instructions (Signed)

## 2021-01-30 ENCOUNTER — Telehealth: Payer: Self-pay | Admitting: Pharmacist

## 2021-01-30 NOTE — Telephone Encounter (Signed)
Called pt to discuss lipid panel results. LDL is unchanged and actually slightly worse after starting Welchol - increased from 173 to 178. Her LDL goal is < 100 for primary prevention. She is intolerant to simvastatin, rosuvastatin, atorvastatin, and ezetimibe. 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 Insurance does not cover Nexletol well. She is a bit more agreeable to trying Leqvio now (previously declined this as well) due to less frequent dosing schedule and the fact that someone else would be giving her injections instead of herself.  Will mail pt benefit investigation form to sign to see if her insurance will cover Leqvio. Her prescription insurance is not scanned into Epic and is follows: [employer]  ID: [id number] BIN: [id number] PCN: [id number] GRP: [email or web address] Customer service [phone]  Pt aware I will call once I have received her signature on the pt benefit investigation form and once I have heard back from [employer].

## 2021-02-04 ENCOUNTER — Ambulatory Visit (INDEPENDENT_AMBULATORY_CARE_PROVIDER_SITE_OTHER): Payer: BC Managed Care – PPO

## 2021-02-04 DIAGNOSIS — I5022 Chronic systolic (congestive) heart failure: Secondary | ICD-10-CM | POA: Diagnosis not present

## 2021-02-04 DIAGNOSIS — Z95810 Presence of automatic (implantable) cardiac defibrillator: Secondary | ICD-10-CM | POA: Diagnosis not present

## 2021-02-06 ENCOUNTER — Telehealth: Payer: Self-pay

## 2021-02-06 NOTE — Progress Notes (Signed)
EPIC Encounter for ICM Monitoring  Patient Name: Alison Howard is a 60 y.o. female Date: 02/06/2021 Primary Care Physican: Johny Blamer, MD Primary Cardiologist: Shirlee Latch Electrophysiologist: Joycelyn Schmid Pacing:  93%     01/25/2021 Office Weight: 240 lbs         Attempted call to patient and unable to reach.  Left detailed message per DPR regarding transmission. Transmission reviewed.    Corvue thoracic impedance suggesting normal fluid levels.   Prescribed: Furosemide 20 mg Take 1 tablet (20 mg) by mouth as needed.    Labs: 12/25/2020 Creatinine 0.92, BUN 10, Potassium 4.0, Sodium 137, GFR >60 09/19/2020 Creatinine 0.86, BUN 10, Potassium 4.2, Sodium 137, GFR >60 A complete set of results can be found in Results Review.   Recommendations:   Left voice mail with ICM number and encouraged to call if experiencing any fluid symptoms.   Follow-up plan: ICM clinic phone appointment on 03/11/2021.   91 day device clinic remote transmission 04/05/2021.     EP/Cardiology next office visit:   Recall 06/24/2021 with Dr Shirlee Latch.  Recall 01/25/2022 with Dr Graciela Husbands   Copy of ICM check sent to Dr. Graciela Husbands.     3 month ICM trend: 02/04/2021.    1 Year ICM trend:       Karie Soda, RN 02/06/2021 12:57 PM

## 2021-02-06 NOTE — Telephone Encounter (Signed)
Remote ICM transmission received.  Attempted call to patient regarding ICM remote transmission and left detailed message per DPR.  Advised to return call for any fluid symptoms or questions. Next ICM remote transmission scheduled 03/11/2021.    

## 2021-03-07 ENCOUNTER — Other Ambulatory Visit: Payer: Self-pay | Admitting: Cardiology

## 2021-03-07 NOTE — Telephone Encounter (Signed)
Called pt, resending Leqvio portal form to sign. She states she completed this online, but this does not allow [place] to submit our verification benefit investigation. She will mail back signature.

## 2021-03-11 ENCOUNTER — Ambulatory Visit (INDEPENDENT_AMBULATORY_CARE_PROVIDER_SITE_OTHER): Payer: BC Managed Care – PPO

## 2021-03-11 DIAGNOSIS — Z95810 Presence of automatic (implantable) cardiac defibrillator: Secondary | ICD-10-CM | POA: Diagnosis not present

## 2021-03-11 DIAGNOSIS — I5022 Chronic systolic (congestive) heart failure: Secondary | ICD-10-CM

## 2021-03-12 NOTE — Progress Notes (Signed)
EPIC Encounter for ICM Monitoring  Patient Name: Alison Howard is a 60 y.o. female Date: 03/12/2021 Primary Care Physican: Johny Blamer, MD Primary Cardiologist: Shirlee Latch Electrophysiologist: Joycelyn Schmid Pacing:  95%     03/12/2021 Office Weight: 238 lbs         Spoke with patient and heart failure questions reviewed.  Pt asymptomatic for fluid accumulation during decreased impedance.  She has been on vacation over the last couple of weeks and drinking more fluid.   Corvue thoracic impedance suggesting possible fluid accumulation starting 7/30 and returned to baseline on transmission date, 8/15.     Prescribed: Furosemide 20 mg Take 1 tablet (20 mg) by mouth as needed.    Labs: 12/25/2020 Creatinine 0.92, BUN 10, Potassium 4.0, Sodium 137, GFR >60 09/19/2020 Creatinine 0.86, BUN 10, Potassium 4.2, Sodium 137, GFR >60 A complete set of results can be found in Results Review.   Recommendations:  Recommendation to limit salt intake to 2000 mg daily and fluid intake to 64 oz daily.  Encouraged to call if experiencing any fluid symptoms.    Follow-up plan: ICM clinic phone appointment on 04/15/2021.   91 day device clinic remote transmission 04/05/2021.     EP/Cardiology next office visit:   Recall 06/24/2021 with Dr Shirlee Latch.  Recall 01/25/2022 with Dr Graciela Husbands   Copy of ICM check sent to Dr. Graciela Husbands.     3 month ICM trend: 03/11/2021.    1 Year ICM trend:       Karie Soda, RN 03/12/2021 4:44 PM

## 2021-03-20 NOTE — Telephone Encounter (Signed)
Leqvio portal form received and faxed to [employer].

## 2021-03-25 ENCOUNTER — Other Ambulatory Visit: Payer: Self-pay | Admitting: Cardiology

## 2021-03-27 ENCOUNTER — Ambulatory Visit (HOSPITAL_COMMUNITY)
Admission: RE | Admit: 2021-03-27 | Discharge: 2021-03-27 | Disposition: A | Discharge status: Home / Self Care | Payer: BC Managed Care – PPO | Source: Ambulatory Visit | Attending: Cardiology | Admitting: Cardiology

## 2021-03-27 ENCOUNTER — Other Ambulatory Visit: Payer: Self-pay

## 2021-03-27 DIAGNOSIS — I5022 Chronic systolic (congestive) heart failure: Secondary | ICD-10-CM | POA: Insufficient documentation

## 2021-03-27 LAB — BASIC METABOLIC PANEL
Anion gap: 9 (ref 5–15)
BUN: 12 mg/dL (ref 6–20)
CO2: 27 mmol/L (ref 22–32)
Calcium: 9.5 mg/dL (ref 8.9–10.3)
Chloride: 100 mmol/L (ref 98–111)
Creatinine, Ser: 1.05 mg/dL — ABNORMAL HIGH (ref 0.44–1.00)
GFR, Estimated: >60 mL/min (ref >60)
Glucose, Bld: 100 mg/dL — ABNORMAL HIGH (ref 70–99)
Potassium: 4 mmol/L (ref 3.5–5.1)
Sodium: 136 mmol/L (ref 135–145)

## 2021-03-27 MED ORDER — PRAVASTATIN SODIUM 20 MG PO TABS: 20.0000 mg | ORAL_TABLET | Freq: Every evening | ORAL | 11 refills | Status: DC

## 2021-03-27 NOTE — Addendum Note (Signed)
Addended by: Marguarite Markov E on: 03/27/2021 12:23 PM   Modules accepted: Orders

## 2021-03-27 NOTE — Telephone Encounter (Signed)
Received response from [employer] that [name] is not covered with her [employer], but is covered under her medical benefits with commercial [employer] ([place]) and can use buy and [employer]. "[name] is subject to a calendar year deductible of $2,000 covered at 80% until the patient reaches a $3,000 calendar out-of-pocket maximum" which pt has not met.   Called her insurance, they stated [facility] is the only specialty pharmacy in network who cannot order Leqvio, so ordering from a specialty pharmacy is not an option, and copay through buy and bill seems to be cost prohibitive.  Called pt to discuss options as [name] will be cost prohibitive as is Nexletol, and she's already intolerant to simvastatin, rosuvastatin, atorvastatin, and ezetimibe. She still does not wish to give PCSK9i injections, but is agreeable to try a low dose of pravastatin 83m daily. Will call pt in 1 month to follow up with tolerability.

## 2021-03-28 MED ORDER — ENTRESTO 97-103 MG PO TABS: 1.0000 | ORAL_TABLET | Freq: Two times a day (BID) | ORAL | 3 refills | Status: DC

## 2021-03-28 MED ORDER — BISOPROLOL FUMARATE 5 MG PO TABS: 5.0000 mg | ORAL_TABLET | Freq: Two times a day (BID) | ORAL | 3 refills | Status: DC

## 2021-03-28 NOTE — Addendum Note (Signed)
Addended by: Noralee Space on: 03/28/2021 09:53 AM   Modules accepted: Orders

## 2021-04-05 ENCOUNTER — Ambulatory Visit (INDEPENDENT_AMBULATORY_CARE_PROVIDER_SITE_OTHER): Payer: Self-pay

## 2021-04-05 DIAGNOSIS — I429 Cardiomyopathy, unspecified: Secondary | ICD-10-CM

## 2021-04-05 LAB — CUP PACEART REMOTE DEVICE CHECK
Battery Remaining Longevity: 55 mo
Battery Remaining Percentage: 62 %
Battery Voltage: 2.96 V
Brady Statistic AP VP Percent: 7.1 %
Brady Statistic AP VS Percent: 1 %
Brady Statistic AS VP Percent: 89 %
Brady Statistic AS VS Percent: 1.3 %
Brady Statistic RA Percent Paced: 3.9 %
Date Time Interrogation Session: 20220909041047
HighPow Impedance: 68 Ohm
HighPow Impedance: 68 Ohm
Implantable Lead Implant Date: 20120529
Implantable Lead Implant Date: 20120529
Implantable Lead Implant Date: 20120529
Implantable Lead Location: 753858
Implantable Lead Location: 753859
Implantable Lead Location: 753860
Implantable Pulse Generator Implant Date: 20190724
Lead Channel Impedance Value: 1225 Ohm
Lead Channel Impedance Value: 450 Ohm
Lead Channel Impedance Value: 590 Ohm
Lead Channel Pacing Threshold Amplitude: 0.75 V
Lead Channel Pacing Threshold Amplitude: 1.25 V
Lead Channel Pacing Threshold Amplitude: 1.25 V
Lead Channel Pacing Threshold Pulse Width: 0.5 ms
Lead Channel Pacing Threshold Pulse Width: 0.5 ms
Lead Channel Pacing Threshold Pulse Width: 0.5 ms
Lead Channel Sensing Intrinsic Amplitude: 12 mV
Lead Channel Sensing Intrinsic Amplitude: 3.2 mV
Lead Channel Setting Pacing Amplitude: 2 V
Lead Channel Setting Pacing Amplitude: 2 V
Lead Channel Setting Pacing Amplitude: 2.25 V
Lead Channel Setting Pacing Pulse Width: 0.5 ms
Lead Channel Setting Pacing Pulse Width: 0.5 ms
Lead Channel Setting Sensing Sensitivity: 0.5 mV
Pulse Gen Serial Number: 9842506

## 2021-04-11 NOTE — Progress Notes (Signed)
Remote ICD transmission.   

## 2021-04-15 ENCOUNTER — Ambulatory Visit (INDEPENDENT_AMBULATORY_CARE_PROVIDER_SITE_OTHER): Payer: BC Managed Care – PPO

## 2021-04-15 DIAGNOSIS — Z95810 Presence of automatic (implantable) cardiac defibrillator: Secondary | ICD-10-CM | POA: Diagnosis not present

## 2021-04-15 DIAGNOSIS — I5022 Chronic systolic (congestive) heart failure: Secondary | ICD-10-CM

## 2021-04-16 ENCOUNTER — Telehealth: Payer: Self-pay

## 2021-04-16 NOTE — Telephone Encounter (Signed)
Remote ICM transmission received.  Attempted call to patient regarding ICM remote transmission and left detailed message per DPR.  Advised to return call for any fluid symptoms or questions. Next ICM remote transmission scheduled 05/20/2021.    

## 2021-04-16 NOTE — Progress Notes (Signed)
EPIC Encounter for ICM Monitoring  Patient Name: Alison Howard is a 60 y.o. female Date: 04/16/2021 Primary Care Physican: Johny Blamer, MD Primary Cardiologist: Shirlee Latch Electrophysiologist: Joycelyn Schmid Pacing:  96%     03/12/2021 Office Weight: 238 lbs         Attempted call to patient and unable to reach.  Left detailed message per DPR regarding transmission. Transmission reviewed.    Corvue thoracic impedance normal but was suggesting possible fluid accumulation from 9/6-9/17.  Imedance suggesting dryness 8/19-9/5.   Prescribed: Furosemide 20 mg Take 1 tablet (20 mg) by mouth as needed.    Labs: 12/25/2020 Creatinine 0.92, BUN 10, Potassium 4.0, Sodium 137, GFR >60 09/19/2020 Creatinine 0.86, BUN 10, Potassium 4.2, Sodium 137, GFR >60 A complete set of results can be found in Results Review.   Recommendations:  Left voice mail with ICM number and encouraged to call if experiencing any fluid symptoms.   Follow-up plan: ICM clinic phone appointment on 05/20/2021.   91 day device clinic remote transmission 07/05/2021.     EP/Cardiology next office visit:   Recall 06/24/2021 with Dr Shirlee Latch.  Recall 01/25/2022 with Dr Graciela Husbands   Copy of ICM check sent to Dr. Graciela Husbands.      3 month ICM trend: 04/15/2021.    1 Year ICM trend:       Karie Soda, RN 04/16/2021 10:35 AM

## 2021-04-24 ENCOUNTER — Telehealth: Payer: Self-pay | Admitting: Pharmacist

## 2021-04-24 DIAGNOSIS — E7800 Pure hypercholesterolemia, unspecified: Secondary | ICD-10-CM

## 2021-04-24 NOTE — Telephone Encounter (Signed)
Called pt and left message to follow up with tolerability of pravastatin that was started last month. Planning to titrate to max tolerated dose.  Pt intolerant to simvastatin, rosuvastatin, atorvastatin, ezetimibe, and Welchol. Leqvio and Nexletol are cost prohibitive. Pt refuses self-injections so PCSK9i not an option. She does not qualify for ORION-4 trial. No other lipid options at this time.

## 2021-04-24 NOTE — Telephone Encounter (Signed)
Spoke with pt, reports tolerating pravastatin. Doesn't wish to increase her dose. Will recheck labs in 1 month to assess efficacy of 20mg  dose.

## 2021-05-20 ENCOUNTER — Ambulatory Visit (INDEPENDENT_AMBULATORY_CARE_PROVIDER_SITE_OTHER): Payer: BC Managed Care – PPO

## 2021-05-20 DIAGNOSIS — I5022 Chronic systolic (congestive) heart failure: Secondary | ICD-10-CM

## 2021-05-20 DIAGNOSIS — Z95810 Presence of automatic (implantable) cardiac defibrillator: Secondary | ICD-10-CM | POA: Diagnosis not present

## 2021-05-22 ENCOUNTER — Telehealth: Payer: Self-pay

## 2021-05-22 ENCOUNTER — Other Ambulatory Visit: Payer: BC Managed Care – PPO

## 2021-05-22 NOTE — Telephone Encounter (Signed)
Remote ICM transmission received.  Attempted call to patient regarding ICM remote transmission and left detailed message per DPR to return call.   

## 2021-05-22 NOTE — Progress Notes (Signed)
EPIC Encounter for ICM Monitoring  Patient Name: Alison Howard is a 60 y.o. female Date: 05/22/2021 Primary Care Physican: Johny Blamer, MD Primary Cardiologist: Shirlee Latch Electrophysiologist: Joycelyn Schmid Pacing:  96%     03/12/2021 Office Weight: 238 lbs         Attempted call to patient and unable to reach.  Left detailed message per DPR regarding transmission. Transmission reviewed.    Corvue thoracic impedance suggesting dryness starting 10/13 but was suggesting possible fluid accumulation from 9/27-10/12.  Impedance for the last 2-3 months showing large peaks and valleys suggesting alternating pattern of possible dryness and fluid accumulation.   Prescribed: Furosemide 20 mg Take 1 tablet (20 mg) by mouth as needed.    Labs: 03/27/2021 Creatinine 1.05, BUN 12, Potassium 4.0, Sodium 136, GFR >60 12/25/2020 Creatinine 0.92, BUN 10, Potassium 4.0, Sodium 137, GFR >60 09/19/2020 Creatinine 0.86, BUN 10, Potassium 4.2, Sodium 137, GFR >60 A complete set of results can be found in Results Review.   Recommendations:  Left voice mail with ICM number and encouraged to call if experiencing any fluid symptoms.   Follow-up plan: ICM clinic phone appointment on 07/01/2021.   91 day device clinic remote transmission 07/05/2021.     EP/Cardiology next office visit:   Recall 06/24/2021 with Dr Shirlee Latch.  Recall 01/25/2022 with Dr Graciela Husbands   Copy of ICM check sent to Dr. Graciela Husbands.       3 month ICM trend: 05/20/2021.    1 Year ICM trend:       Karie Soda, RN 05/22/2021 9:05 AM

## 2021-05-29 ENCOUNTER — Other Ambulatory Visit: Payer: Self-pay

## 2021-05-29 ENCOUNTER — Other Ambulatory Visit: Payer: BC Managed Care – PPO | Admitting: *Deleted

## 2021-05-29 DIAGNOSIS — E7800 Pure hypercholesterolemia, unspecified: Secondary | ICD-10-CM

## 2021-05-29 LAB — HEPATIC FUNCTION PANEL
ALT: 10 IU/L (ref 0–32)
AST: 16 IU/L (ref 0–40)
Albumin: 4.5 g/dL (ref 3.8–4.9)
Alkaline Phosphatase: 70 IU/L (ref 44–121)
Bilirubin Total: 0.7 mg/dL (ref 0.0–1.2)
Bilirubin, Direct: 0.15 mg/dL (ref 0.00–0.40)
Total Protein: 7 g/dL (ref 6.0–8.5)

## 2021-05-29 LAB — LIPID PANEL
Chol/HDL Ratio: 3.5 ratio (ref 0.0–4.4)
Cholesterol, Total: 252 mg/dL — ABNORMAL HIGH (ref 100–199)
HDL: 73 mg/dL (ref >39)
LDL Chol Calc (NIH): 147 mg/dL — ABNORMAL HIGH (ref 0–99)
Triglycerides: 178 mg/dL — ABNORMAL HIGH (ref 0–149)
VLDL Cholesterol Cal: 32 mg/dL (ref 5–40)

## 2021-06-03 ENCOUNTER — Telehealth: Payer: Self-pay | Admitting: Pharmacist

## 2021-06-03 DIAGNOSIS — E7800 Pure hypercholesterolemia, unspecified: Secondary | ICD-10-CM

## 2021-06-03 MED ORDER — PRAVASTATIN SODIUM 80 MG PO TABS
ORAL_TABLET | ORAL | 5 refills | Status: DC
Start: 2021-06-03 — End: 2021-08-14

## 2021-06-03 NOTE — Telephone Encounter (Signed)
LDL mildly improved from 178 to 147, TG remain slightly elevated. She is currently taking pravastatin 20mg  daily. Previously intolerant to simvastatin, rosuvastatin, atorvastatin, and ezetimibe, Welchol didn't lower LDL at all. Leqvio and Nexletol are cost prohibitive. Pt refuses self-injections so PCSK9i not an option. She does not qualify for ORION-4 trial. LDL goal < 100 for primary prevention. Pt is willing to increase dose of pravastatin. Will send in rx for 80mg  tablet, she will cut tab in half for first month then increase up to full tab daily if tolerating well. I'll call pt in 4-6 weeks to follow up with tolerability and reschedule labs at that time.

## 2021-06-28 ENCOUNTER — Emergency Department (HOSPITAL_COMMUNITY): Payer: BC Managed Care – PPO

## 2021-06-28 ENCOUNTER — Encounter (HOSPITAL_COMMUNITY): Payer: Self-pay

## 2021-06-28 ENCOUNTER — Emergency Department (HOSPITAL_COMMUNITY)
Admission: EM | Admit: 2021-06-28 | Discharge: 2021-06-28 | Disposition: A | Discharge status: Home / Self Care | Payer: BC Managed Care – PPO | Attending: Emergency Medicine | Admitting: Emergency Medicine

## 2021-06-28 DIAGNOSIS — I5022 Chronic systolic (congestive) heart failure: Secondary | ICD-10-CM | POA: Diagnosis not present

## 2021-06-28 DIAGNOSIS — Z79899 Other long term (current) drug therapy: Secondary | ICD-10-CM | POA: Insufficient documentation

## 2021-06-28 DIAGNOSIS — M79605 Pain in left leg: Secondary | ICD-10-CM

## 2021-06-28 DIAGNOSIS — M79662 Pain in left lower leg: Secondary | ICD-10-CM | POA: Insufficient documentation

## 2021-06-28 MED ORDER — ACETAMINOPHEN 325 MG PO TABS
650.0000 mg | ORAL_TABLET | Freq: Once | ORAL | Status: AC
  Administered 2021-06-28: 650 mg via ORAL
  Filled 2021-06-28: qty 2

## 2021-06-28 NOTE — Discharge Instructions (Signed)
Take tylenol 3 times a day as needed for pain.  You may supplement with Tylenol as needed.   Use ice packs or heating pads if this helps control your pain. Use muscle creams/patches such as Salonpas, icy hot, Bengay, Biofreeze to help with pain. Use the crutches and knee immobilizer as needed to move around.  If you are not having any pain, you do not need to use them. Call the orthopedic doctor listed below to set up a follow-up appointment. Return to the emergency room if you develop fevers, redness and skin changes of the leg, your foot goes numb and stays numb, or with any new, worsening, or concerning symptoms

## 2021-06-28 NOTE — ED Provider Notes (Signed)
[facility] DEPT Provider Note   CSN: [id number] Arrival date & time: 06/28/21  0054     History Chief Complaint  Patient presents with   [name]    Alison Howard is a 60 y.o. female presenting for evaluation of left leg pain.   Patient states just prior to arrival she walked out on the porch when her left leg gave out on her, she fell onto her right side.  She denied her head or lose consciousness.  Since then, she has had pain in the mid anterior left shin.  Pain radiates from her left ankle.  Worse with movement and weightbearing, nothing makes it better.  She has not taken anything including Tylenol.  No numbness or tingling.  No pain in her hips or back in the fall.  History of heart failure for which he takes medication, no other medical problems.  HPI     Past Medical History:  Diagnosis Date   CHF (congestive heart failure) (HCC)    Chronic systolic heart failure (HCC) 04/25/2013   GERD (gastroesophageal reflux disease)    Hypercholesteremia    LBBB (left bundle branch block)    Morbid obesity ([place]) 12/28/2014   NICM (nonischemic cardiomyopathy) ([place])    a. s/p STJ CRTD   OSA (obstructive sleep apnea) 12/28/2014   Severe with AHI 27 events per hour on average and the highest AHI was 40 events per hour   Ventricular tachycardia    a. 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 STJ CRTD implanted for NICM, CHF   RIGHT/LEFT HEART CATH AND CORONARY ANGIOGRAPHY N/A 10/07/2018   Procedure: RIGHT/LEFT HEART CATH AND CORONARY ANGIOGRAPHY;  Surgeon: Larey Dresser, MD;  Location: [facility];  Service: Cardiovascular;  Laterality: N/A;   TOTAL ABDOMINAL HYSTERECTOMY       OB History   No obstetric history on file.     Family History  Problem Relation Age of Onset   Coronary artery disease Mother    Heart attack Mother    Hyperlipidemia Sister    Heart disease Father     Social History   Tobacco Use   Smoking status: Never   Smokeless tobacco: Never  Vaping Use   Vaping Use: Never used  Substance Use Topics   Alcohol use: Yes   Drug use: No    Home Medications Prior to Admission medications   Medication Sig Start Date End Date Taking? Authorizing Provider  bisoprolol (ZEBETA) 5 MG tablet Take 1 tablet (5 mg total) by mouth 2 (two) times daily. 03/28/21   Larey Dresser, MD  citalopram (CELEXA) 40 MG tablet Take 40 mg by mouth daily.     [provider]  eplerenone (INSPRA) 50 MG tablet TAKE 1 TABLET BY MOUTH EVERY DAY 01/25/21   Larey Dresser, MD  flecainide (  TAMBOCOR) 50 MG tablet Take 1 tablet (50 mg total) by mouth 2 (two) times daily. 01/25/21   Duke Salvia, MD  furosemide (LASIX) 20 MG tablet Take 1 tablet (20 mg total) by mouth as needed. 04/19/19   Laurey Morale, MD  pantoprazole (PROTONIX) 40 MG tablet Take 40 mg by mouth 2 (two) times daily.     [provider]  pravastatin (PRAVACHOL) 80 MG tablet Take up to 1 tablet daily as tolerated 06/03/21   Laurey Morale, MD  sacubitril-valsartan (ENTRESTO) 97-103 MG Take 1 tablet by mouth 2 (two) times daily. 03/28/21   Laurey Morale, MD    Allergies    Ciprofloxacin hcl, Codeine, Morphine and related, Sulfa antibiotics, Zetia [ezetimibe], Atorvastatin,  Dapagliflozin, Rosuvastatin, Simvastatin, and Welchol [colesevelam]  Review of Systems   Review of Systems  Musculoskeletal:  Positive for myalgias.  Neurological:  Negative for numbness.   Physical Exam Updated Vital Signs BP 122/78 (BP Location: Left Arm)   Pulse 74   Temp 98.2 F (36.8 C) (Oral)   Resp 15   Ht 5\' 2"  (1.575 m)   Wt 104.3 kg   SpO2 99%   BMI 42.07 kg/m   Physical Exam Vitals and nursing note reviewed.  Constitutional:      General: She is not in acute distress.    Appearance: She is well-developed. She is obese.     Comments: Nontoxic  HENT:     Head: Normocephalic and atraumatic.  Eyes:     Extraocular Movements: Extraocular movements intact.  Cardiovascular:     Rate and Rhythm: Normal rate.  Pulmonary:     Effort: Pulmonary effort is normal.  Abdominal:     General: There is no distension.  Musculoskeletal:        General: Tenderness present. Normal range of motion.     Cervical back: Normal range of motion.       Legs:     Comments: Tenderness palpation of mid anterior left lower leg.  No tenderness palpation over the knee joint or over the ankle joint.  Pedal pulse 2+.  Good distal sensation and cap refill.  Full active range of motion of the ankle and toes without difficulty.  Patient unable to flex and extend at the knee due to pain.  No posterior or calf pain.  No erythema or warmth.  No skin induration.  Skin:    General: Skin is warm.     Capillary Refill: Capillary refill takes less than 2 seconds.     Findings: No rash.  Neurological:     Mental Status: She is alert and oriented to person, place, and time.    ED Results / Procedures / Treatments   Labs (all labs ordered are listed, but only abnormal results are displayed) Labs Reviewed - No data to display  EKG None  Radiology DG Tibia/Fibula Left  Result Date: 06/28/2021 CLINICAL DATA:  Recent fall 1 hour ago, initial encounter EXAM: LEFT TIBIA AND FIBULA - 2 VIEW COMPARISON:   None. FINDINGS: No acute fracture or dislocation is noted. Degenerative changes of the left knee joint are seen. No soft tissue abnormality is noted. Calcaneal spurring is seen. IMPRESSION: Degenerative change without acute abnormality. Electronically Signed   By: Alcide Clever M.D.   On: 06/28/2021 01:43    Procedures Procedures   Medications Ordered in ED Medications  acetaminophen (TYLENOL) tablet 650 mg (650 mg Oral Given 06/28/21 0154)    ED Course  I have  reviewed the triage vital signs and the nursing notes.  Pertinent labs & imaging results that were available during my care of the patient were reviewed by me and considered in my medical decision making (see chart for details).    MDM Rules/Calculators/A&P                           Patient presenting for evaluation of left mid anterior lower leg pain after fall.  On exam, patient appears nontoxic.  There is no deformity or contusion.  No skin changes.  Tenderness palpation over the musculature.  As pain began after a fall, will obtain x-ray of the leg, although suspicion for bony injury.  X-ray viewed and independently interpreted by me, no fracture or dislocation.  Discussed with patient.  Discussed likely muscular pain.  Discussed symptomatic management, patient refusing narcotics or muscle relaxers.  As such, encouraged Tylenol and ibuprofen and muscle creams.  Will use knee immobilizer and crutches to help with ambulation.  Exam is not consistent with DVT, infection, or arterial occlusion.  Will have patient follow-up with orthopedics if symptoms not improving.  At this time, patient appears safe for discharge.  Return precautions given.  Patient states she understands and agrees to plan.  Final Clinical Impression(s) / ED Diagnoses Final diagnoses:  Left leg pain    Rx / DC Orders ED Discharge Orders     None        Alveria Apley, PA-C 06/28/21 0215    Molpus, Jonny Ruiz, MD 06/28/21 [phone]

## 2021-06-28 NOTE — ED Triage Notes (Signed)
Patient arrived after falling on porch step, complaints of left lower leg pain. No deformity.

## 2021-07-01 ENCOUNTER — Ambulatory Visit (INDEPENDENT_AMBULATORY_CARE_PROVIDER_SITE_OTHER): Payer: BC Managed Care – PPO

## 2021-07-01 DIAGNOSIS — Z95810 Presence of automatic (implantable) cardiac defibrillator: Secondary | ICD-10-CM

## 2021-07-01 DIAGNOSIS — I5022 Chronic systolic (congestive) heart failure: Secondary | ICD-10-CM

## 2021-07-02 NOTE — Progress Notes (Signed)
EPIC Encounter for ICM Monitoring  Patient Name: Alison Howard is a 60 y.o. female Date: 07/02/2021 Primary Care Physican: Johny Blamer, MD Primary Cardiologist: Shirlee Latch Electrophysiologist: Graciela Husbands 07/02/2021 Office Weight: 236 lbs          Spoke with patient and heart failure questions reviewed.  Pt asymptomatic for fluid accumulation and feeling well.  She does not experience any fluid symptoms during decreased impedance.  She is on a very restrictive diet call fodmaps due to IBS.  Her food choices are so limiting that she eats what she can and it is not necessarily low salt.   Reviewed transmission and explained there is a history of long frequent episodes of possible fluid accumulation which she does not have any symptoms so there does not know that she needs PRN Furosemide.  She asked if she should start taking PRN Furosemide on regular basis.     Corvue thoracic impedance normal but was suggesting possible fluid accumulation from 11/11-12/2.  Impedance showing long frequent episodes of possible fluid accumulation for 14 days in October, 11 days in September and 15 days July-Aug timeframe.   Prescribed: Furosemide 20 mg Take 1 tablet (20 mg) by mouth as needed.    Labs: 03/27/2021 Creatinine 1.05, BUN 12, Potassium 4.0, Sodium 136, GFR >60 12/25/2020 Creatinine 0.92, BUN 10, Potassium 4.0, Sodium 137, GFR >60 09/19/2020 Creatinine 0.86, BUN 10, Potassium 4.2, Sodium 137, GFR >60 A complete set of results can be found in Results Review.   Recommendations: Advised patient to make [facility] appointment to discuss if she needs to take Furosemide on a regular basis versus PRN since she is having long periods of possible fluid accumulation frequently.  She will call today to make an appointment.     Follow-up plan: ICM clinic phone appointment on 08/05/2021.   91 day device clinic remote transmission 07/05/2021.     EP/Cardiology next office visit:   Recall 06/24/2021 with Dr Shirlee Latch.  Recall 01/25/2022  with Dr Graciela Husbands   Copy of ICM check sent to Dr. Graciela Husbands.       3 month ICM trend: 07/01/2021.    12-14 Month ICM trend:      Karie Soda, RN 07/02/2021 7:59 AM

## 2021-07-05 ENCOUNTER — Ambulatory Visit (INDEPENDENT_AMBULATORY_CARE_PROVIDER_SITE_OTHER): Payer: Self-pay

## 2021-07-05 DIAGNOSIS — I429 Cardiomyopathy, unspecified: Secondary | ICD-10-CM

## 2021-07-05 LAB — CUP PACEART REMOTE DEVICE CHECK
Battery Remaining Longevity: 52 mo
Battery Remaining Percentage: 59 %
Battery Voltage: 2.95 V
Brady Statistic AP VP Percent: 7.7 %
Brady Statistic AP VS Percent: 1 %
Brady Statistic AS VP Percent: 88 %
Brady Statistic AS VS Percent: 1.2 %
Brady Statistic RA Percent Paced: 4.6 %
Date Time Interrogation Session: 20221209020016
HighPow Impedance: 60 Ohm
HighPow Impedance: 60 Ohm
Implantable Lead Implant Date: 20120529
Implantable Lead Implant Date: 20120529
Implantable Lead Implant Date: 20120529
Implantable Lead Location: 753858
Implantable Lead Location: 753859
Implantable Lead Location: 753860
Implantable Pulse Generator Implant Date: 20190724
Lead Channel Impedance Value: 1100 Ohm
Lead Channel Impedance Value: 410 Ohm
Lead Channel Impedance Value: 550 Ohm
Lead Channel Pacing Threshold Amplitude: 0.75 V
Lead Channel Pacing Threshold Amplitude: 1.25 V
Lead Channel Pacing Threshold Amplitude: 1.75 V
Lead Channel Pacing Threshold Pulse Width: 0.5 ms
Lead Channel Pacing Threshold Pulse Width: 0.5 ms
Lead Channel Pacing Threshold Pulse Width: 0.5 ms
Lead Channel Sensing Intrinsic Amplitude: 12 mV
Lead Channel Sensing Intrinsic Amplitude: 3.4 mV
Lead Channel Setting Pacing Amplitude: 2 V
Lead Channel Setting Pacing Amplitude: 2.25 V
Lead Channel Setting Pacing Amplitude: 2.25 V
Lead Channel Setting Pacing Pulse Width: 0.5 ms
Lead Channel Setting Pacing Pulse Width: 0.5 ms
Lead Channel Setting Sensing Sensitivity: 0.5 mV
Pulse Gen Serial Number: 9842506

## 2021-07-15 NOTE — Progress Notes (Signed)
Remote ICD transmission.   

## 2021-07-16 NOTE — Addendum Note (Signed)
Addended by: Syrenity Klepacki E on: 07/16/2021 02:27 PM   Modules accepted: Orders

## 2021-07-16 NOTE — Telephone Encounter (Signed)
Called pt to f/u with pravastatin tolerability. She's been tolerating 40mg  daily dosing well. Will increase to 80mg  daily. Scheduled f/u labs in 6 weeks to assess efficacy.

## 2021-07-30 DIAGNOSIS — M25572 Pain in left ankle and joints of left foot: Secondary | ICD-10-CM | POA: Diagnosis not present

## 2021-07-30 DIAGNOSIS — M79605 Pain in left leg: Secondary | ICD-10-CM | POA: Diagnosis not present

## 2021-07-30 DIAGNOSIS — M25562 Pain in left knee: Secondary | ICD-10-CM | POA: Diagnosis not present

## 2021-07-30 DIAGNOSIS — M25662 Stiffness of left knee, not elsewhere classified: Secondary | ICD-10-CM | POA: Diagnosis not present

## 2021-08-01 DIAGNOSIS — M25572 Pain in left ankle and joints of left foot: Secondary | ICD-10-CM | POA: Diagnosis not present

## 2021-08-01 DIAGNOSIS — M25562 Pain in left knee: Secondary | ICD-10-CM | POA: Diagnosis not present

## 2021-08-01 DIAGNOSIS — M25662 Stiffness of left knee, not elsewhere classified: Secondary | ICD-10-CM | POA: Diagnosis not present

## 2021-08-01 DIAGNOSIS — M79605 Pain in left leg: Secondary | ICD-10-CM | POA: Diagnosis not present

## 2021-08-05 ENCOUNTER — Telehealth (HOSPITAL_COMMUNITY): Payer: Self-pay | Admitting: Vascular Surgery

## 2021-08-05 ENCOUNTER — Ambulatory Visit (INDEPENDENT_AMBULATORY_CARE_PROVIDER_SITE_OTHER): Payer: BC Managed Care – PPO

## 2021-08-05 DIAGNOSIS — I5022 Chronic systolic (congestive) heart failure: Secondary | ICD-10-CM

## 2021-08-05 DIAGNOSIS — Z95810 Presence of automatic (implantable) cardiac defibrillator: Secondary | ICD-10-CM | POA: Diagnosis not present

## 2021-08-05 MED ORDER — FUROSEMIDE 20 MG PO TABS: 20.0000 mg | ORAL_TABLET | Freq: Every day | ORAL | 3 refills | Status: DC

## 2021-08-05 NOTE — Progress Notes (Signed)
Spoke with patient and advised Dr Aundra Dubin recommended to take Lasix 20 mg daily long term.  BMET needed next week.  She agreed to have BMET drawn at 1/18 OV with Dr Aundra Dubin.

## 2021-08-05 NOTE — Progress Notes (Signed)
Her impedance seems to be chronically low.  I think she needs to take 20 mg daily Lasix long-term.  BMET in 1 week.

## 2021-08-05 NOTE — Telephone Encounter (Signed)
Left VM to make f/u appt w/ [facility] next ava

## 2021-08-05 NOTE — Progress Notes (Signed)
EPIC Encounter for ICM Monitoring  Patient Name: Alison Howard is a 61 y.o. female Date: 08/05/2021 Primary Care Physican: Shirline Frees, MD Primary Cardiologist: Aundra Dubin Electrophysiologist: Caryl Comes 07/02/2021 Office Weight: 236 lbs          Spoke with patient and heart failure questions reviewed.  Pt reports feeling chest fullness and unable to sleep lying down at night.  She attempted to call [facility] for appointment but has not received a call back.  Will send message to the [facility] scheduler.       Corvue thoracic impedance suggesting possible fluid accumulation starting 07/19/2021.   Prescribed: Furosemide 20 mg Take 1 tablet (20 mg) by mouth as needed.    Labs: 03/27/2021 Creatinine 1.05, BUN 12, Potassium 4.0, Sodium 136, GFR >60 12/25/2020 Creatinine 0.92, BUN 10, Potassium 4.0, Sodium 137, GFR >60 09/19/2020 Creatinine 0.86, BUN 10, Potassium 4.2, Sodium 137, GFR >60 A complete set of results can be found in Results Review.   Recommendations: Advised to take PRN Furosemide 1 tablet 20 mg x 3 days.  Will send copy to Dr Aundra Dubin for review and further commendations if needed.   Message sent to [facility] to call pt for appointment.   Follow-up plan: ICM clinic phone appointment on 08/09/2021 (manual) to recheck fluid levels.   91 day device clinic remote transmission 10/04/2021.     EP/Cardiology next office visit:   Recall 06/24/2021 with Dr Aundra Dubin.  Recall 01/25/2022 with Dr Caryl Comes   Copy of ICM check sent to Dr. Caryl Comes.      3 month ICM trend: 08/05/2021.    12-14 Month ICM trend:     [name], RN 08/05/2021 12:58 PM

## 2021-08-06 ENCOUNTER — Encounter (HOSPITAL_COMMUNITY): Payer: Self-pay | Admitting: Emergency Medicine

## 2021-08-06 ENCOUNTER — Emergency Department (HOSPITAL_COMMUNITY): Payer: BC Managed Care – PPO

## 2021-08-06 ENCOUNTER — Other Ambulatory Visit: Payer: Self-pay

## 2021-08-06 ENCOUNTER — Inpatient Hospital Stay (HOSPITAL_COMMUNITY)
Admission: EM | Admit: 2021-08-06 | Discharge: 2021-08-08 | DRG: 291 | Disposition: A | Discharge status: Home / Self Care | Payer: BC Managed Care – PPO | Attending: Internal Medicine | Admitting: Internal Medicine

## 2021-08-06 DIAGNOSIS — R946 Abnormal results of thyroid function studies: Secondary | ICD-10-CM | POA: Diagnosis present

## 2021-08-06 DIAGNOSIS — E7800 Pure hypercholesterolemia, unspecified: Secondary | ICD-10-CM | POA: Diagnosis present

## 2021-08-06 DIAGNOSIS — Z95810 Presence of automatic (implantable) cardiac defibrillator: Secondary | ICD-10-CM

## 2021-08-06 DIAGNOSIS — Z20822 Contact with and (suspected) exposure to covid-19: Secondary | ICD-10-CM | POA: Diagnosis present

## 2021-08-06 DIAGNOSIS — I517 Cardiomegaly: Secondary | ICD-10-CM | POA: Diagnosis not present

## 2021-08-06 DIAGNOSIS — I10 Essential (primary) hypertension: Secondary | ICD-10-CM | POA: Diagnosis not present

## 2021-08-06 DIAGNOSIS — I110 Hypertensive heart disease with heart failure: Principal | ICD-10-CM | POA: Diagnosis present

## 2021-08-06 DIAGNOSIS — J811 Chronic pulmonary edema: Secondary | ICD-10-CM | POA: Diagnosis not present

## 2021-08-06 DIAGNOSIS — Z888 Allergy status to other drugs, medicaments and biological substances status: Secondary | ICD-10-CM

## 2021-08-06 DIAGNOSIS — I428 Other cardiomyopathies: Secondary | ICD-10-CM | POA: Diagnosis not present

## 2021-08-06 DIAGNOSIS — Z885 Allergy status to narcotic agent status: Secondary | ICD-10-CM | POA: Diagnosis not present

## 2021-08-06 DIAGNOSIS — Z8249 Family history of ischemic heart disease and other diseases of the circulatory system: Secondary | ICD-10-CM | POA: Diagnosis not present

## 2021-08-06 DIAGNOSIS — R079 Chest pain, unspecified: Secondary | ICD-10-CM | POA: Diagnosis not present

## 2021-08-06 DIAGNOSIS — Z83438 Family history of other disorder of lipoprotein metabolism and other lipidemia: Secondary | ICD-10-CM | POA: Diagnosis not present

## 2021-08-06 DIAGNOSIS — I493 Ventricular premature depolarization: Secondary | ICD-10-CM | POA: Diagnosis not present

## 2021-08-06 DIAGNOSIS — G4733 Obstructive sleep apnea (adult) (pediatric): Secondary | ICD-10-CM | POA: Diagnosis present

## 2021-08-06 DIAGNOSIS — R000 Tachycardia, unspecified: Secondary | ICD-10-CM | POA: Diagnosis not present

## 2021-08-06 DIAGNOSIS — I5043 Acute on chronic combined systolic (congestive) and diastolic (congestive) heart failure: Secondary | ICD-10-CM | POA: Diagnosis present

## 2021-08-06 DIAGNOSIS — Z6841 Body Mass Index (BMI) 40.0 and over, adult: Secondary | ICD-10-CM

## 2021-08-06 DIAGNOSIS — Z79899 Other long term (current) drug therapy: Secondary | ICD-10-CM | POA: Diagnosis not present

## 2021-08-06 DIAGNOSIS — J90 Pleural effusion, not elsewhere classified: Secondary | ICD-10-CM | POA: Diagnosis not present

## 2021-08-06 DIAGNOSIS — E876 Hypokalemia: Secondary | ICD-10-CM | POA: Diagnosis present

## 2021-08-06 DIAGNOSIS — Z882 Allergy status to sulfonamides status: Secondary | ICD-10-CM | POA: Diagnosis not present

## 2021-08-06 DIAGNOSIS — I472 Ventricular tachycardia, unspecified: Secondary | ICD-10-CM | POA: Diagnosis present

## 2021-08-06 DIAGNOSIS — I509 Heart failure, unspecified: Secondary | ICD-10-CM

## 2021-08-06 DIAGNOSIS — R42 Dizziness and giddiness: Secondary | ICD-10-CM | POA: Diagnosis present

## 2021-08-06 DIAGNOSIS — K219 Gastro-esophageal reflux disease without esophagitis: Secondary | ICD-10-CM | POA: Diagnosis present

## 2021-08-06 DIAGNOSIS — I447 Left bundle-branch block, unspecified: Secondary | ICD-10-CM | POA: Diagnosis present

## 2021-08-06 LAB — CBC WITH DIFFERENTIAL/PLATELET
Abs Immature Granulocytes: 0.04 10*3/uL (ref 0.00–0.07)
Basophils Absolute: 0.1 10*3/uL (ref 0.0–0.1)
Basophils Relative: 1 %
Eosinophils Absolute: 0.2 10*3/uL (ref 0.0–0.5)
Eosinophils Relative: 2 %
HCT: 39.1 % (ref 36.0–46.0)
Hemoglobin: 12.7 g/dL (ref 12.0–15.0)
Immature Granulocytes: 0 %
Lymphocytes Relative: 25 %
Lymphs Abs: 2.5 10*3/uL (ref 0.7–4.0)
MCH: 31.9 pg (ref 26.0–34.0)
MCHC: 32.5 g/dL (ref 30.0–36.0)
MCV: 98.2 fL (ref 80.0–100.0)
Monocytes Absolute: 1.2 10*3/uL — ABNORMAL HIGH (ref 0.1–1.0)
Monocytes Relative: 12 %
Neutro Abs: 5.9 10*3/uL (ref 1.7–7.7)
Neutrophils Relative %: 60 %
Platelets: 395 10*3/uL (ref 150–400)
RBC: 3.98 MIL/uL (ref 3.87–5.11)
RDW: 14.1 % (ref 11.5–15.5)
WBC: 9.8 10*3/uL (ref 4.0–10.5)
nRBC: 0 % (ref 0.0–0.2)

## 2021-08-06 LAB — BASIC METABOLIC PANEL
Anion gap: 14 (ref 5–15)
BUN: 6 mg/dL (ref 6–20)
CO2: 25 mmol/L (ref 22–32)
Calcium: 8.6 mg/dL — ABNORMAL LOW (ref 8.9–10.3)
Chloride: 100 mmol/L (ref 98–111)
Creatinine, Ser: 0.94 mg/dL (ref 0.44–1.00)
GFR, Estimated: >60 mL/min (ref >60)
Glucose, Bld: 98 mg/dL (ref 70–99)
Potassium: 3 mmol/L — ABNORMAL LOW (ref 3.5–5.1)
Sodium: 139 mmol/L (ref 135–145)

## 2021-08-06 LAB — BRAIN NATRIURETIC PEPTIDE: B Natriuretic Peptide: 1244.4 pg/mL — ABNORMAL HIGH (ref 0.0–100.0)

## 2021-08-06 LAB — RESP PANEL BY RT-PCR (FLU A&B, COVID) ARPGX2
Influenza A by PCR: NEGATIVE
Influenza B by PCR: NEGATIVE
SARS Coronavirus 2 by RT PCR: NEGATIVE

## 2021-08-06 LAB — MAGNESIUM: Magnesium: 1.7 mg/dL (ref 1.7–2.4)

## 2021-08-06 MED ORDER — CITALOPRAM HYDROBROMIDE 20 MG PO TABS
40.0000 mg | ORAL_TABLET | Freq: Every day | ORAL | Status: DC
  Administered 2021-08-07 – 2021-08-08 (×2): 40 mg via ORAL
  Filled 2021-08-06: qty 2
  Filled 2021-08-06: qty 4

## 2021-08-06 MED ORDER — AMIODARONE HCL IN DEXTROSE 360-4.14 MG/200ML-% IV SOLN
60.0000 mg/h | INTRAVENOUS | Status: AC
  Administered 2021-08-06 (×2): 60 mg/h via INTRAVENOUS
  Filled 2021-08-06 (×2): qty 200

## 2021-08-06 MED ORDER — PRAVASTATIN SODIUM 40 MG PO TABS
80.0000 mg | ORAL_TABLET | Freq: Every day | ORAL | Status: DC
  Administered 2021-08-07 – 2021-08-08 (×2): 80 mg via ORAL
  Filled 2021-08-06 (×3): qty 2

## 2021-08-06 MED ORDER — POTASSIUM CHLORIDE 10 MEQ/100ML IV SOLN
10.0000 meq | INTRAVENOUS | Status: AC
  Administered 2021-08-06 (×3): 10 meq via INTRAVENOUS
  Filled 2021-08-06 (×3): qty 100

## 2021-08-06 MED ORDER — BISOPROLOL FUMARATE 5 MG PO TABS
5.0000 mg | ORAL_TABLET | Freq: Two times a day (BID) | ORAL | Status: DC
  Administered 2021-08-07 – 2021-08-08 (×3): 5 mg via ORAL
  Filled 2021-08-06 (×5): qty 1

## 2021-08-06 MED ORDER — ACETAMINOPHEN 325 MG PO TABS
650.0000 mg | ORAL_TABLET | ORAL | Status: DC | PRN
  Administered 2021-08-07: 650 mg via ORAL
  Filled 2021-08-06: qty 2

## 2021-08-06 MED ORDER — AMIODARONE LOAD VIA INFUSION
150.0000 mg | Freq: Once | INTRAVENOUS | Status: AC
  Administered 2021-08-06: 150 mg via INTRAVENOUS
  Filled 2021-08-06: qty 83.34

## 2021-08-06 MED ORDER — FUROSEMIDE 10 MG/ML IJ SOLN
40.0000 mg | Freq: Once | INTRAMUSCULAR | Status: AC
  Administered 2021-08-06: 40 mg via INTRAVENOUS
  Filled 2021-08-06: qty 4

## 2021-08-06 MED ORDER — LOPERAMIDE HCL 2 MG PO CAPS
2.0000 mg | ORAL_CAPSULE | ORAL | Status: DC | PRN
  Administered 2021-08-06: 2 mg via ORAL
  Filled 2021-08-06: qty 1

## 2021-08-06 MED ORDER — SACUBITRIL-VALSARTAN 97-103 MG PO TABS
1.0000 | ORAL_TABLET | Freq: Two times a day (BID) | ORAL | Status: DC
  Administered 2021-08-07 – 2021-08-08 (×3): 1 via ORAL
  Filled 2021-08-06 (×4): qty 1

## 2021-08-06 MED ORDER — PANTOPRAZOLE SODIUM 40 MG PO TBEC
40.0000 mg | DELAYED_RELEASE_TABLET | Freq: Every day | ORAL | Status: DC
  Administered 2021-08-07: 40 mg via ORAL
  Filled 2021-08-06 (×2): qty 1

## 2021-08-06 MED ORDER — AMIODARONE HCL IN DEXTROSE 360-4.14 MG/200ML-% IV SOLN
30.0000 mg/h | INTRAVENOUS | Status: DC
  Administered 2021-08-07 – 2021-08-08 (×2): 30 mg/h via INTRAVENOUS
  Filled 2021-08-06 (×3): qty 200

## 2021-08-06 MED ORDER — ONDANSETRON HCL 4 MG/2ML IJ SOLN: 4.0000 mg | Freq: Four times a day (QID) | INTRAMUSCULAR | Status: DC | PRN

## 2021-08-06 MED ORDER — POTASSIUM CHLORIDE CRYS ER 20 MEQ PO TBCR
40.0000 meq | EXTENDED_RELEASE_TABLET | Freq: Once | ORAL | Status: AC
  Administered 2021-08-06: 40 meq via ORAL
  Filled 2021-08-06: qty 2

## 2021-08-06 MED ORDER — ENOXAPARIN SODIUM 40 MG/0.4ML IJ SOSY
40.0000 mg | PREFILLED_SYRINGE | INTRAMUSCULAR | Status: DC
  Administered 2021-08-06 – 2021-08-07 (×2): 40 mg via SUBCUTANEOUS
  Filled 2021-08-06 (×2): qty 0.4

## 2021-08-06 NOTE — ED Triage Notes (Signed)
Pt BIB [facility] from home. Pt noticed her heart racing and also her apple watch alerted her that her HR was 163. EMS administered NS bolus 500 ml and her HR lowered to low 100s. BP stable. Pt does endorse SHOB over past few days, denies chest pain, but also states she has been feeling weak and dizzy. All other VSS. Pt NAD at this time.

## 2021-08-06 NOTE — ED Provider Notes (Signed)
[facility] DEPART[facility] Provider Note   CSN: [id number] Arrival date & time: 08/06/21  1549    History  Chief Complaint  Patient presents with   Tachycardia    Alison Howard is a 61 y.o. female with past medical history significant for CHF s/p ICD, hypertension here for evaluation of tachycardia.  Patient states she was sitting on her apple watch alerted her that her heart rate was in the low 160s.  She states it did not name an arrhythmia.  Does have ICD implanted however states her ICD has not shocked her.  States she recently had her ICD interrogated which told her she had fluid overload, recently started on Lasix, 20 mg.  States her base weight is 240 which she is at currently.  Denies any lower extremity swelling, abdominal distention.  Sleeps with 1.5 pillows at night.  Denies any PND orthopnea.  No recent surgery or immobilization.  No history of PE, DVT.  She is not anticoagulated.  No fever, chills, emesis, back pain, pleuritic chest pain, lower extremity swelling, lower extremity pain.  Did have some lightheadedness, dizziness and some chest tightness when her palpitations occurred however is currently asymptomatic.  EMS arrival gave her 500 cc IV fluids which Brought down heart rate into the high 90s, low 100s.  No recent sick contacts.   HPI     Home Medications Prior to Admission medications   Medication Sig Start Date End Date Taking? Authorizing Provider  bisoprolol (ZEBETA) 5 MG tablet Take 1 tablet (5 mg total) by mouth 2 (two) times daily. 03/28/21   Larey Dresser, MD  citalopram (CELEXA) 40 MG tablet Take 40 mg by mouth daily.     [provider]  eplerenone (INSPRA) 50 MG tablet TAKE 1 TABLET BY MOUTH EVERY DAY 01/25/21   Larey Dresser, MD  flecainide (TAMBOCOR) 50 MG tablet Take 1 tablet (50 mg total) by mouth 2 (two) times daily. 01/25/21   Deboraha Sprang, MD  furosemide (LASIX) 20 MG tablet Take 1 tablet (20 mg total) by mouth  daily. 08/05/21   Larey Dresser, MD  pantoprazole (PROTONIX) 40 MG tablet Take 40 mg by mouth 2 (two) times daily.     [provider]  pravastatin (PRAVACHOL) 80 MG tablet Take up to 1 tablet daily as tolerated 06/03/21   Larey Dresser, MD  sacubitril-valsartan (ENTRESTO) 97-103 MG Take 1 tablet by mouth 2 (two) times daily. 03/28/21   Larey Dresser, MD      Allergies    Ciprofloxacin hcl, Codeine, Morphine and related, Sulfa antibiotics, Zetia [ezetimibe], Atorvastatin, Dapagliflozin, Rosuvastatin, Simvastatin, and Welchol [colesevelam]    Review of Systems   Review of Systems  Constitutional: Negative.   HENT: Negative.    Respiratory:  Positive for chest tightness (with palpitatations, resolved). Negative for apnea, cough, choking, shortness of breath, wheezing and stridor.   Cardiovascular:  Positive for palpitations. Negative for chest pain and leg swelling.  Gastrointestinal: Negative.   Genitourinary: Negative.   Musculoskeletal: Negative.   Skin: Negative.   Neurological: Negative.   All other systems reviewed and are negative.  Physical Exam Updated Vital Signs BP 122/77    Pulse 66    Temp 99.1 F (37.3 C) (Oral)    Resp (!) 21    Ht 5\' 2"  (1.575 m)    Wt 108.9 kg    SpO2 96%    BMI 43.90 kg/m  Physical Exam Vitals and nursing note reviewed.  Constitutional:      General: She is not in acute distress.    Appearance: She is well-developed. She is not ill-appearing, toxic-appearing or diaphoretic.  HENT:     Head: Normocephalic and atraumatic.     Nose: Nose normal.     Mouth/Throat:     Mouth: Mucous membranes are moist.  Eyes:     Pupils: Pupils are equal, round, and reactive to light.  Cardiovascular:     Rate and Rhythm: Normal rate.     Pulses: Normal pulses.          Radial pulses are 2+ on the right side and 2+ on the left side.       Dorsalis pedis pulses are 2+ on the right side and 2+ on the left side.     Heart sounds: Normal heart sounds.   Pulmonary:     Effort: Pulmonary effort is normal. No respiratory distress.     Breath sounds: Rales present.     Comments: Crackles bilaterally at lower bases, speaks in full sentences without difficulty Abdominal:     General: Bowel sounds are normal. There is no distension.     Palpations: Abdomen is soft. There is no mass.     Tenderness: There is no abdominal tenderness. There is no right CVA tenderness, left CVA tenderness, guarding or rebound.     Hernia: No hernia is present.     Comments: Soft, nontender  Musculoskeletal:        General: No swelling, tenderness, deformity or signs of injury. Normal range of motion.     Cervical back: Normal range of motion.     Right lower leg: No edema.     Left lower leg: No edema.     Comments: No bony tenderness, full range of motion, compartment soft.  Skin:    General: Skin is warm and dry.     Capillary Refill: Capillary refill takes less than 2 seconds.     Comments: No edema, erythema or warmth.  Neurological:     General: No focal deficit present.     Mental Status: She is alert and oriented to person, place, and time.     Comments: Cranial nerves 2-12 grossly intact Intact sensation  Psychiatric:        Mood and Affect: Mood normal.   ED Results / Procedures / Treatments   Labs (all labs ordered are listed, but only abnormal results are displayed) Labs Reviewed  CBC WITH DIFFERENTIAL/PLATELET - Abnormal; Notable for the following components:      Result Value   Monocytes Absolute 1.2 (*)    All other components within normal limits  BASIC METABOLIC PANEL - Abnormal; Notable for the following components:   Potassium 3.0 (*)    Calcium 8.6 (*)    All other components within normal limits  BRAIN NATRIURETIC PEPTIDE - Abnormal; Notable for the following components:   B Natriuretic Peptide 1,244.4 (*)    All other components within normal limits  RESP PANEL BY RT-PCR (FLU A&B, COVID) ARPGX2  MAGNESIUM  HIV ANTIBODY  (ROUTINE TESTING W REFLEX)  T4, FREE  TSH  BASIC METABOLIC PANEL  CBC  MAGNESIUM   EKG EKG Interpretation  Date/Time:  Tuesday August 06 2021 16:03:01 EST Ventricular Rate:  94 PR Interval:  155 QRS Duration: 126 QT Interval:  441 QTC Calculation: 552 R Axis:   -60 Text Interpretation: Sinus rhythm Ventricular trigeminy Probable left atrial enlargement RBBB and LAFB no sig change except PVC  Confirmed by Charlesetta Shanks [phone]) on 08/06/2021 4:14:13 PM  Radiology DG Chest 2 View  Result Date: 08/06/2021 CLINICAL DATA:  Chest pain EXAM: CHEST - 2 VIEW COMPARISON:  None. FINDINGS: Left-sided ICD is present. The heart is mildly enlarged. There is central pulmonary vascular congestion. There is some patchy airspace opacities posterior right lower lobe. There are likely small bilateral pleural effusions. There is no evidence for pneumothorax or acute fracture. IMPRESSION: 1. Cardiomegaly with central pulmonary vascular congestion. 2. Small pleural effusions with minimal airspace disease in the posterior right lower lobe. Electronically Signed   By: Ronney Asters M.D.   On: 08/06/2021 18:08    Procedures .Critical Care Performed by: Nettie Elm, PA-C Authorized by: Nettie Elm, PA-C   Critical care provider statement:    Critical care time (minutes):  30   Critical care was necessary to treat or prevent imminent or life-threatening deterioration of the following conditions:  Cardiac failure   Critical care was time spent personally by me on the following activities:  Development of treatment plan with patient or surrogate, discussions with consultants, evaluation of patient's response to treatment, examination of patient, ordering and review of laboratory studies, ordering and review of radiographic studies, ordering and performing treatments and interventions, pulse oximetry, re-evaluation of patient's condition, review of old charts, obtaining history from patient or  surrogate, vascular access procedures and blood draw for specimens     12/25/20 Last EF 35-40 %  Medications Ordered in ED Medications  potassium chloride 10 mEq in 100 mL IVPB (10 mEq Intravenous New Bag/Given 08/06/21 2033)  amiodarone (NEXTERONE) 1.8 mg/mL load via infusion 150 mg (150 mg Intravenous Bolus from Bag 08/06/21 1938)    Followed by  amiodarone (NEXTERONE PREMIX) 360-4.14 MG/200ML-% (1.8 mg/mL) IV infusion (60 mg/hr Intravenous New Bag/Given 08/06/21 1936)    Followed by  amiodarone (NEXTERONE PREMIX) 360-4.14 MG/200ML-% (1.8 mg/mL) IV infusion (has no administration in time range)  acetaminophen (TYLENOL) tablet 650 mg (has no administration in time range)  ondansetron (ZOFRAN) injection 4 mg (has no administration in time range)  enoxaparin (LOVENOX) injection 40 mg (has no administration in time range)  bisoprolol (ZEBETA) tablet 5 mg (has no administration in time range)  sacubitril-valsartan (ENTRESTO) 97-103 mg per tablet (has no administration in time range)  pravastatin (PRAVACHOL) tablet 80 mg (has no administration in time range)  citalopram (CELEXA) tablet 40 mg (has no administration in time range)  pantoprazole (PROTONIX) EC tablet 40 mg (has no administration in time range)  furosemide (LASIX) injection 40 mg (40 mg Intravenous Given 08/06/21 1855)  potassium chloride SA (KLOR-CON M) CR tablet 40 mEq (40 mEq Oral Given 08/06/21 1855)    ED Course/ Medical Decision Making/ A&P    Pleasant 61 year old with known cardiac history s/p ICD implant who is here for evaluation of palpitations, alerted by apple watch.  No ICD firing.  Was recently started on diuretics however does not appear grossly fluid overloaded, denies any PND, orthopnea.  Received IV fluids per EMS report which resolved her symptoms.  Did have some associated chest tightness, shortness of breath with symptoms which resolved.  Did have some lightheadedness, dizziness with symptoms however has nonfocal  neuro exam without deficits.  I have low suspicion for acute intracranial abnormality as a cause of her symptoms.  We will plan on checking labs, imaging, interrogate ICD and reassess.  Labs and imaging personally reviewed and interpreted:  CBC without leukocytosis BMP hypokalemia at 3.0 will supplement BNP  [id number] no prior to compare Mag 1.7 DG chest cardiomegaly with congestion EKG with ventricular trigeminy Review of St Jude ICD report.  Per [facility] tech patient has been in and out of V. tach throughout the after noon.  Longest 21 minutes.  States patient's ICD has not kicked in as rate was less than 160  Reviewed cardiac monitor throughout ED stay. Some PVCs  Patient given IVF potassium and IV lasix for her fluid overload. Will consult with Cards for rec  Dr. Caryl Comes with Cardiology rec amiodarone infusion, bolus and admit to cardiology. He has spoken with Dr. Marlou Porch with Cardiology for admission for ventricular tachycardia and CHF exacerbation  Patient seen and evaluated by attending Dr. Johnney Killian who agrees with above treatment, plan and dispo.                           Medical Decision Making Amount and/or Complexity of Data Reviewed Independent Historian: parent and EMS External Data Reviewed: labs, radiology, ECG and notes.    Details: outside cardiuology notes, EKG, labs Labs: ordered. Decision-making details documented in ED Course. Radiology: ordered and independent interpretation performed. Decision-making details documented in ED Course. ECG/medicine tests: ordered and independent interpretation performed. Decision-making details documented in ED Course.  Risk Prescription drug management. Drug therapy requiring intensive monitoring for toxicity. Decision regarding hospitalization.         Final Clinical Impression(s) / ED Diagnoses Final diagnoses:  Ventricular tachycardia, unspecified  Acute on chronic congestive heart failure, unspecified heart failure type  ([place])  Hypokalemia    Rx / DC Orders ED Discharge Orders     None         Olin Gurski A, PA-C 08/06/21 2108    Charlesetta Shanks, MD 08/06/21 2124

## 2021-08-06 NOTE — ED Notes (Signed)
Cardiology at bedside.

## 2021-08-06 NOTE — ED Provider Notes (Signed)
I provided a substantive portion of the care of this patient.  I personally performed the entirety of the history for this encounter.  EKG Interpretation  Date/Time:  Tuesday August 06 2021 16:03:01 EST Ventricular Rate:  94 PR Interval:  155 QRS Duration: 126 QT Interval:  441 QTC Calculation: 552 R Axis:   -60 Text Interpretation: Sinus rhythm Ventricular trigeminy Probable left atrial enlargement RBBB and LAFB no sig change except PVC Confirmed by Arby Barrette [phone]) on 08/06/2021 4:14:13 PM   Consult: Reviewed with Dr. Graciela Husbands who recommends initiating amiodarone bolus and drip.  He has already notified Dr. Anne Fu of need for admission.  Patient reports she is started noting high heart rate alarms on her apple watch.  She reports that showed up to the 160s.  She reports that she was feeling lightheaded when these episodes were occurring.  She denies who experience chest pain but would feel slightly nauseated and weak.  Symptoms have been coming and going.  She has an ICD but it did not fire at any point time.  She did not have a syncopal episode.  She reports she has IBS so she chronically has diarrhea and some fluid loss.  She is also taking Lasix.  Patient is alert with clear mental status.  No respiratory distress at rest.  Heart is regular without gross rub murmur gallop.  Monitor shows sinus rhythm in the mid 60s with narrow complex.  Rare PVC.  Crackles bilateral lung bases.  Abdomen soft and nontender.  No significant peripheral edema.  Calves soft and nontender  I agree with plan of management.  Potassium IV added to oral potassium, amiodarone initiated per consultation with Dr. Randye Lobo, MD 08/06/21 [phone]

## 2021-08-07 DIAGNOSIS — I5043 Acute on chronic combined systolic (congestive) and diastolic (congestive) heart failure: Secondary | ICD-10-CM

## 2021-08-07 DIAGNOSIS — I472 Ventricular tachycardia, unspecified: Secondary | ICD-10-CM

## 2021-08-07 LAB — CBC
HCT: 40 % (ref 36.0–46.0)
Hemoglobin: 13.2 g/dL (ref 12.0–15.0)
MCH: 32.5 pg (ref 26.0–34.0)
MCHC: 33 g/dL (ref 30.0–36.0)
MCV: 98.5 fL (ref 80.0–100.0)
Platelets: 359 10*3/uL (ref 150–400)
RBC: 4.06 MIL/uL (ref 3.87–5.11)
RDW: 14.2 % (ref 11.5–15.5)
WBC: 10.5 10*3/uL (ref 4.0–10.5)
nRBC: 0 % (ref 0.0–0.2)

## 2021-08-07 LAB — BASIC METABOLIC PANEL
Anion gap: 11 (ref 5–15)
Anion gap: 12 (ref 5–15)
BUN: 6 mg/dL (ref 6–20)
BUN: 8 mg/dL (ref 6–20)
CO2: 26 mmol/L (ref 22–32)
CO2: 27 mmol/L (ref 22–32)
Calcium: 8.1 mg/dL — ABNORMAL LOW (ref 8.9–10.3)
Calcium: 9 mg/dL (ref 8.9–10.3)
Chloride: 98 mmol/L (ref 98–111)
Chloride: 95 mmol/L — ABNORMAL LOW (ref 98–111)
Creatinine, Ser: 0.88 mg/dL (ref 0.44–1.00)
Creatinine, Ser: 0.78 mg/dL (ref 0.44–1.00)
GFR, Estimated: >60 mL/min (ref >60)
GFR, Estimated: >60 mL/min (ref >60)
Glucose, Bld: 159 mg/dL — ABNORMAL HIGH (ref 70–99)
Glucose, Bld: 118 mg/dL — ABNORMAL HIGH (ref 70–99)
Potassium: 3.1 mmol/L — ABNORMAL LOW (ref 3.5–5.1)
Potassium: 4.1 mmol/L (ref 3.5–5.1)
Sodium: 135 mmol/L (ref 135–145)
Sodium: 134 mmol/L — ABNORMAL LOW (ref 135–145)

## 2021-08-07 LAB — HIV ANTIBODY (ROUTINE TESTING W REFLEX): HIV Screen 4th Generation wRfx: NONREACTIVE

## 2021-08-07 LAB — TSH: TSH: 11.321 u[IU]/mL — ABNORMAL HIGH (ref 0.350–4.500)

## 2021-08-07 LAB — T4, FREE: Free T4: 1.21 ng/dL — ABNORMAL HIGH (ref 0.61–1.12)

## 2021-08-07 LAB — MAGNESIUM: Magnesium: 1.7 mg/dL (ref 1.7–2.4)

## 2021-08-07 MED ORDER — FUROSEMIDE 10 MG/ML IJ SOLN
40.0000 mg | Freq: Once | INTRAMUSCULAR | Status: AC
  Administered 2021-08-07: 40 mg via INTRAVENOUS
  Filled 2021-08-07: qty 4

## 2021-08-07 MED ORDER — MAGNESIUM OXIDE -MG SUPPLEMENT 400 (240 MG) MG PO TABS
400.0000 mg | ORAL_TABLET | Freq: Two times a day (BID) | ORAL | Status: DC
  Administered 2021-08-07 – 2021-08-08 (×3): 400 mg via ORAL
  Filled 2021-08-07 (×3): qty 1

## 2021-08-07 MED ORDER — MAGNESIUM SULFATE 4 GM/100ML IV SOLN
4.0000 g | Freq: Once | INTRAVENOUS | Status: AC
  Administered 2021-08-07: 4 g via INTRAVENOUS
  Filled 2021-08-07: qty 100

## 2021-08-07 MED ORDER — POTASSIUM CHLORIDE CRYS ER 20 MEQ PO TBCR
40.0000 meq | EXTENDED_RELEASE_TABLET | Freq: Two times a day (BID) | ORAL | Status: AC
  Administered 2021-08-07 (×2): 40 meq via ORAL
  Filled 2021-08-07 (×2): qty 2

## 2021-08-07 MED ORDER — PANTOPRAZOLE SODIUM 40 MG PO TBEC
40.0000 mg | DELAYED_RELEASE_TABLET | Freq: Two times a day (BID) | ORAL | Status: DC
  Administered 2021-08-07 – 2021-08-08 (×2): 40 mg via ORAL
  Filled 2021-08-07 (×2): qty 1

## 2021-08-07 MED ORDER — SPIRONOLACTONE 25 MG PO TABS
25.0000 mg | ORAL_TABLET | Freq: Every day | ORAL | Status: DC
  Administered 2021-08-07 – 2021-08-08 (×2): 25 mg via ORAL
  Filled 2021-08-07 (×3): qty 1

## 2021-08-07 MED ORDER — POTASSIUM CHLORIDE CRYS ER 20 MEQ PO TBCR
40.0000 meq | EXTENDED_RELEASE_TABLET | Freq: Once | ORAL | Status: AC
  Administered 2021-08-07: 40 meq via ORAL
  Filled 2021-08-07: qty 2

## 2021-08-07 NOTE — Consult Note (Addendum)
ELECTROPHYSIOLOGY CONSULT NOTE    Patient ID: Alison Howard MRN: [id number], DOB/AGE: 1961/01/22 61 y.o.  Admit date: 08/06/2021 Date of Consult: 08/07/2021  Primary Physician: Johny Blamer, MD Primary Cardiologist: Dr. Shirlee Latch Electrophysiologist: Dr. Graciela Husbands  Referring Provider: Dr. Joyce Gross  Patient Profile: Alison Howard is a 61 y.o. female with a history of NICM s/p St. 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 Joyce Gross.  HPI:  Alison Howard is a 60 y.o. female with medical history as above known well to Dr. Graciela Husbands.   She was her USOH until she noted HF symptoms with orthopnea and dyspnea over the past several days. Diuretics were adjusted by ICM/Dr. Shirlee Latch in setting of thoracic impedence trends.   Yesterday around lunchtime she began to feel lightheadedness and nausea, accompanied with intermittent near syncopal sensations. She called EMS and was found to be in [facility] in the 160s as below.       Per EMS report, vagal maneuvers had no effect on HR. She converted to sinus in 90s after fluid bolus given.   On arrival to ED she was noted to be NSR. Amiodarone started per phone discussion with Dr. Graciela Husbands.   Pertinent labs on admission include K 3.0 (supped), BNP 1244, Cr 0.94, Hgb 12.7, WBC 9.8, Mg 1.7.   TSH 11.3, Free T4 1.21 (borderline high)  This am she is doing well. Given another dose of lasix with increased UOP and breathing/orthopnea symptoms have greatly improved. Reports history of above with worsening HF symptoms for 2-3 days prior. Apple watch alerted to HR in 160s, she states she was fatigued but otherwise felt OK.   Past Medical History:  Diagnosis Date   CHF (congestive heart failure) (HCC)    Chronic systolic heart failure (HCC) 04/25/2013   GERD (gastroesophageal reflux disease)    Hypercholesteremia    LBBB (left bundle branch block)    Morbid obesity (HCC) 12/28/2014    NICM (nonischemic cardiomyopathy) (HCC)    a. s/p STJ CRTD   OSA (obstructive sleep apnea) 12/28/2014   Severe with AHI 27 events per hour on average and the highest AHI was 40 events per hour   Ventricular tachycardia    a. s/p appropriate ICD therapy     Surgical History:  Past Surgical History:  Procedure Laterality Date   BIV ICD GENERATOR CHANGEOUT N/A 02/17/2018   Procedure: BIV ICD GENERATOR CHANGEOUT;  Surgeon: Duke Salvia, MD;  Location: [facility] INVASIVE CV LAB;  Service: Cardiovascular;  Laterality: N/A;   CARDIAC DEFIBRILLATOR PLACEMENT  2012   a. STJ CRTD implanted for NICM, CHF   RIGHT/LEFT HEART CATH AND CORONARY ANGIOGRAPHY N/A 10/07/2018   Procedure: RIGHT/LEFT HEART CATH AND CORONARY ANGIOGRAPHY;  Surgeon: Laurey Morale, MD;  Location: [facility] INVASIVE CV LAB;  Service: Cardiovascular;  Laterality: N/A;   TOTAL ABDOMINAL HYSTERECTOMY       (Not in a hospital admission)   Inpatient Medications:   bisoprolol  5 mg Oral BID   citalopram  40 mg Oral Daily   enoxaparin (LOVENOX) injection  40 mg Subcutaneous Q24H   pantoprazole  40 mg Oral Daily   potassium chloride  40 mEq Oral BID   pravastatin  80 mg Oral Daily   sacubitril-valsartan  1 tablet Oral BID   spironolactone  25 mg Oral Daily    Allergies:  Allergies  Allergen Reactions   Ciprofloxacin Hcl Anaphylaxis  Codeine Anaphylaxis and Nausea And Vomiting   Morphine And Related Anaphylaxis   Sulfa Antibiotics Anaphylaxis   Zetia [Ezetimibe] Rash    Rash and muscle aches   Atorvastatin     Myalgias    Dapagliflozin Hives   Rosuvastatin     Myalgias   Simvastatin     Myalgias    Welchol [Colesevelam]     myalgias    Social History   Socioeconomic History   Marital status: Single    Spouse name: Not on file   Number of children: 0   Years of education: Not on file   Highest education level: Not on file  Occupational History   Not on file  Tobacco Use   Smoking status: Never   Smokeless tobacco:  Never  Vaping Use   Vaping Use: Never used  Substance and Sexual Activity   Alcohol use: Yes   Drug use: No   Sexual activity: Not on file  Other Topics Concern   Not on file  Social History Narrative   Not on file   Social Determinants of Health   Financial Resource Strain: Not on file  Food Insecurity: Not on file  Transportation Needs: Not on file  Physical Activity: Not on file  Stress: Not on file  Social Connections: Not on file  Intimate Partner Violence: Not on file     Family History  Problem Relation Age of Onset   Coronary artery disease Mother    Heart attack Mother    Hyperlipidemia Sister    Heart disease Father      Review of Systems: All other systems reviewed and are otherwise negative except as noted above.  Physical Exam: Vitals:   08/07/21 0500 08/07/21 0630 08/07/21 0715 08/07/21 0727  BP: (!) 175/105 (!) 155/91 (!) 157/101   Pulse: (!) 42 64 61 68  Resp: 19 (!) 25 (!) 23 16  Temp:    98.9 F (37.2 C)  TempSrc:    Oral  SpO2: 97% 94% 96% 93%  Weight:      Height:        GEN- The patient is well appearing, alert and oriented x 3 today.   HEENT: normocephalic, atraumatic; sclera clear, conjunctiva pink; hearing intact; oropharynx clear; neck supple Lungs- Clear to ausculation bilaterally, normal work of breathing.  No wheezes, rales, rhonchi Heart- Regular rate and rhythm, no murmurs, rubs or gallops GI- soft, non-tender, non-distended, bowel sounds present Extremities- no clubbing, cyanosis, or edema; DP/PT/radial pulses 2+ bilaterally MS- no significant deformity or atrophy Skin- warm and dry, no rash or lesion Psych- euthymic mood, full affect Neuro- strength and sensation are intact  Labs:   Lab Results  Component Value Date   WBC 10.5 08/07/2021   HGB 13.2 08/07/2021   HCT 40.0 08/07/2021   MCV 98.5 08/07/2021   PLT 359 08/07/2021    Recent Labs  Lab 08/07/21 0350  NA 135  K 3.1*  CL 98  CO2 26  BUN 6  CREATININE  0.88  CALCIUM 8.1*  GLUCOSE 159*      Radiology/Studies: DG Chest 2 View  Result Date: 08/06/2021 CLINICAL DATA:  Chest pain EXAM: CHEST - 2 VIEW COMPARISON:  None. FINDINGS: Left-sided ICD is present. The heart is mildly enlarged. There is central pulmonary vascular congestion. There is some patchy airspace opacities posterior right lower lobe. There are likely small bilateral pleural effusions. There is no evidence for pneumothorax or acute fracture. IMPRESSION: 1. Cardiomegaly with central pulmonary vascular  congestion. 2. Small pleural effusions with minimal airspace disease in the posterior right lower lobe. Electronically Signed   By: Ronney Asters M.D.   On: 08/06/2021 18:08    EKG:ON arrivals  (personally reviewed)  TELEMETRY: NSR with relatively frequent PVCs (personally reviewed)  DEVICE HISTORY: BiV St Jude implanted 2012, gen change 01/2018  Assessment/Plan: 1.  Sustained VT Appears to have been a singular episode hovering around detection, >2 hours long Keep K > 4.0 and Mg > 2.0. increase K supp and recheck this afternoon to guide further Continue IV amiodarone for now, short course with plans to see Dr. Lovena Le to discuss PVC/VT ablation as outpatient.  Remain off flecainide TSH high, but Free T4 normal, borderline high  2. Acute on chronic systolic CHF Per HF team Diuresis on going Follow electrolytes carefully.   3. Obesity Body mass index is 43.9 kg/m.  Plans to f/u with dietician as outpatient.   EP to follow along.   For questions or updates, please contact [place] Please consult [email or web address] for contact info under Cardiology/STEMI.  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  I suspect some degree of electromechanical interaction was responsible for the triggering of the ventricular tachycardia potentially augmented by the changes associated with hypokalemia.  She has had no recurrent ventricular tachycardia since the event stopped.  She has frequent PVCs occurring at about 10/h i.e. 10-15%.  The PVC morphology suggests a similar but not identical focus to her ventricular tachycardia and we will need to repeat a prolonged rhythm strip to see if we can clarify this.  For now, we will discontinue flecainide and be we will continue amiodarone with transition from IV--p.o. tomorrow and anticipated discharge in the next 48 hours.  Given her young age left ventricular dysfunction And at least in most leads a fairly short intrinsicoid deflection will refer to Dr. Elliot Cousin for consideration of ablation of the PVC focus and perhaps substrate modification.  Aggressive potassium repletion and magnesium repletion.  Continue IV diuresis per heart failure service.  We will reprogram her ICD to a ventricular tachycardia detection rate of 140 bpm with ATP in the low zone but without shock therapy

## 2021-08-07 NOTE — ED Notes (Signed)
Magnesium sulfate paused due to pain. IV is patent. Pt did not want a new IV started, requesting for medication to be stopped.

## 2021-08-07 NOTE — Progress Notes (Signed)
°  Transition of Care [facility]) Screening Note   Patient Details  Name: Alison Howard Date of Birth: [birth year]   Transition of Care [facility]) CM/SW Contact:    Josue Kass, LCSW Phone Number: 08/07/2021, 5:29 PM    Transition of Care Department [facility]) has reviewed patient and no TOC needs have been identified at this time. We will continue to monitor patient advancement through interdisciplinary progression rounds. Patient will benefit from PT/OT consult for disposition recommendations. If new patient transition needs arise, please place a TOC consult.

## 2021-08-07 NOTE — H&P (Signed)
Cardiology Admission History and Physical:   Patient ID: Alison Howard MRN: [id number]; DOB: 24-Mar-1961   Admission date: 08/06/2021  PCP:  Shirline Frees, MD   [facility] Providers Cardiologist:  None  Advanced Heart Failure:  Loralie Champagne, MD  {  Chief Complaint:  "I felt awful"  Patient Profile:   Alison Howard is a 61 y.o. female with hx of NICM s/p SJM CRT-D, prior VT on flecainide, OSA, and GERD who is being seen 08/07/2021 for the evaluation of acute on chronic systolic and diastolic heart failure.  History of Present Illness:   [name] has at least a 10-year history of heart failure.  She had a left heart catheterization performed in 2020 that did not reveal any significant coronary disease.  She previously did have VT that terminated with ATP.  She is currently on flecainide for PVC suppression.  She follows very closely with Dr. Aundra Dubin, and has not had any recent hospitalizations or ICD shocks.  Assessment tells me that until a few days ago she was feeling reasonably well.  She hurt her knee and has been [place] in the interim but feels as though her knee has been her main functional limitation.  2 nights ago when she was going to sleep something felt off after laying down.  She was able to do most things yesterday during the day but maybe felt a little bit more fatigued.  Yesterday evening I can she felt something was off.  She was more fatigued than normal and generally just did not feel well.  Earlier today she had a few episodes where she felt rather lightheaded and almost as though she was going to pass out.  She also felt somewhat nauseated during these episodes.  She became highly alarmed when her apple watch alerted her that her heart rate was in the low 160s and given how unwell she had been feeling she called 911.  Overall she does not feel that she is especially more short of breath than normal.  She was instructed recently to start taking her Lasix daily a few  days ago given her impedance data.  On arrival to the emergency department here she was hemodynamically stable.  She was in sinus rhythm with occasional PVCs.  I interrogated her device and she has had multiple episodes of sustained ventricular tachycardia that were below the device threshold for therapy.  She was quite hypokalemic and was given full IV and oral potassium.  She was loaded with amiodarone and given IV Lasix.  Past Medical History:  Diagnosis Date   CHF (congestive heart failure) (HCC)    Chronic systolic heart failure ([place]) 04/25/2013   GERD (gastroesophageal reflux disease)    Hypercholesteremia    LBBB (left bundle branch block)    Morbid obesity ([place]) 12/28/2014   NICM (nonischemic cardiomyopathy) ([place])    a. s/p STJ CRTD   OSA (obstructive sleep apnea) 12/28/2014   Severe with AHI 27 events per hour on average and the highest AHI was 40 events per hour   Ventricular tachycardia    a. s/p appropriate ICD therapy    Past Surgical History:  Procedure Laterality Date   BIV ICD GENERATOR CHANGEOUT N/A 02/17/2018   Procedure: BIV ICD GENERATOR CHANGEOUT;  Surgeon: Deboraha Sprang, MD;  Location: [place] [facility];  Service: Cardiovascular;  Laterality: N/A;   CARDIAC DEFIBRILLATOR PLACEMENT  2012   a. STJ CRTD implanted for NICM, CHF   RIGHT/LEFT HEART CATH AND CORONARY ANGIOGRAPHY N/A  10/07/2018   Procedure: RIGHT/LEFT HEART CATH AND CORONARY ANGIOGRAPHY;  Surgeon: Larey Dresser, MD;  Location: [place] [facility];  Service: Cardiovascular;  Laterality: N/A;   TOTAL ABDOMINAL HYSTERECTOMY       Medications Prior to Admission: Prior to Admission medications   Medication Sig Start Date End Date Taking? Authorizing Provider  bisoprolol (ZEBETA) 5 MG tablet Take 1 tablet (5 mg total) by mouth 2 (two) times daily. 03/28/21  Yes Larey Dresser, MD  citalopram (CELEXA) 40 MG tablet Take 40 mg by mouth daily.    Yes [provider]  eplerenone (INSPRA) 50 MG tablet  TAKE 1 TABLET BY MOUTH EVERY DAY 01/25/21  Yes Larey Dresser, MD  flecainide (TAMBOCOR) 50 MG tablet Take 1 tablet (50 mg total) by mouth 2 (two) times daily. 01/25/21  Yes Deboraha Sprang, MD  furosemide (LASIX) 20 MG tablet Take 1 tablet (20 mg total) by mouth daily. 08/05/21  Yes Larey Dresser, MD  LORazepam (ATIVAN) 0.5 MG tablet Take 0.5 mg by mouth 2 (two) times daily as needed for anxiety.   Yes [provider]  pantoprazole (PROTONIX) 40 MG tablet Take 40 mg by mouth 2 (two) times daily.    Yes [provider]  pravastatin (PRAVACHOL) 80 MG tablet Take up to 1 tablet daily as tolerated Patient taking differently: Take 80 mg by mouth daily. 06/03/21  Yes Larey Dresser, MD  sacubitril-valsartan (ENTRESTO) 97-103 MG Take 1 tablet by mouth 2 (two) times daily. 03/28/21   Larey Dresser, MD     Allergies:    Allergies  Allergen Reactions   Ciprofloxacin Hcl Anaphylaxis   Codeine Anaphylaxis and Nausea And Vomiting   Morphine And Related Anaphylaxis   Sulfa Antibiotics Anaphylaxis   Zetia [Ezetimibe] Rash    Rash and muscle aches   Atorvastatin     Myalgias    Dapagliflozin Hives   Rosuvastatin     Myalgias   Simvastatin     Myalgias    Welchol [Colesevelam]     myalgias    Social History:   Social History   Socioeconomic History   Marital status: Single    Spouse name: Not on file   Number of children: 0   Years of education: Not on file   Highest education level: Not on file  Occupational History   Not on file  Tobacco Use   Smoking status: Never   Smokeless tobacco: Never  Vaping Use   Vaping Use: Never used  Substance and Sexual Activity   Alcohol use: Yes   Drug use: No   Sexual activity: Not on file  Other Topics Concern   Not on file  Social History Narrative   Not on file   Social Determinants of Health   Financial Resource Strain: Not on file  Food Insecurity: Not on file  Transportation Needs: Not on file  Physical Activity:  Not on file  Stress: Not on file  Social Connections: Not on file  Intimate Partner Violence: Not on file    Family History:   The patient's family history includes Coronary artery disease in her mother; Heart attack in her mother; Heart disease in her father; Hyperlipidemia in her sister.    ROS:  Please see the history of present illness.  All other ROS reviewed and negative.     Physical Exam/Data:   Vitals:   08/06/21 1830 08/06/21 1845 08/06/21 1945 08/06/21 2000  BP: 124/81 (!) 120/92 120/90  122/77  Pulse: 65 65  66  Resp: (!) 21 15 (!) 23 (!) 21  Temp:      TempSrc:      SpO2: 94% 94%  96%  Weight:      Height:        Intake/Output Summary (Last 24 hours) at 08/07/2021 0023 Last data filed at 08/06/2021 2255 Gross per 24 hour  Intake --  Output 2825 ml  Net -2825 ml   Last 3 Weights 08/06/2021 06/28/2021 01/25/2021  Weight (lbs) 240 lb 230 lb 240 lb  Weight (kg) 108.863 kg 104.327 kg 108.863 kg     Body mass index is 43.9 kg/m.  General:  Well nourished, well developed, in no acute distress HEENT: normal Neck: Neck veins appear to be about mid neck at 45 degrees  Vascular: No carotid bruits; Distal pulses 2+ bilaterally   Cardiac:  normal S1, S2; RRR; no murmur  Lungs:  Distant breath sounds but appear mostly clear  Abd: soft, nontender, no hepatomegaly  Ext: no edema Musculoskeletal:  No deformities, BUE and BLE strength normal and equal Skin: warm and dry  Neuro:  CNs 2-12 intact, no focal abnormalities noted Psych:  Normal affect   EKG:  The ECG that was done 08/06/21 was personally reviewed and demonstrates sinus rhythm, likely LV pacing, occasional PVCs  Relevant CV Studies: TTE (11/2020):  1. Left ventricular ejection fraction, by estimation, is 35 to 40%. The  left ventricle has moderately decreased function. The left ventricle  demonstrates regional wall motion abnormalities with basal anteroseptal,  basal inferoseptal, and basal inferior  akinesis.  The left ventricular internal cavity size was mildly dilated.  Left ventricular diastolic parameters are consistent with Grade I  diastolic dysfunction (impaired relaxation).   2. Right ventricular systolic function is normal. The right ventricular  size is normal. Tricuspid regurgitation signal is inadequate for assessing  PA pressure.   3. Left atrial size was mildly dilated.   4. The mitral valve is normal in structure. No evidence of mitral valve  regurgitation. No evidence of mitral stenosis.   5. The aortic valve is tricuspid. Aortic valve regurgitation is not  visualized. No aortic stenosis is present.   6. The inferior vena cava is normal in size with greater than 50%  respiratory variability, suggesting right atrial pressure of 3 mmHg.   Laboratory Data:  High Sensitivity Troponin:  No results for input(s): TROPONINIHS in the last 720 hours.    Chemistry Recent Labs  Lab 08/06/21 1641  NA 139  K 3.0*  CL 100  CO2 25  GLUCOSE 98  BUN 6  CREATININE 0.94  CALCIUM 8.6*  MG 1.7  GFRNONAA >60  ANIONGAP 14    No results for input(s): PROT, ALBUMIN, AST, ALT, ALKPHOS, BILITOT in the last 168 hours. Lipids No results for input(s): CHOL, TRIG, HDL, LABVLDL, LDLCALC, CHOLHDL in the last 168 hours. Hematology Recent Labs  Lab 08/06/21 1641  WBC 9.8  RBC 3.98  HGB 12.7  HCT 39.1  MCV 98.2  MCH 31.9  MCHC 32.5  RDW 14.1  PLT 395   Thyroid No results for input(s): TSH, FREET4 in the last 168 hours. BNP Recent Labs  Lab 08/06/21 1641  BNP 1,244.4*    DDimer No results for input(s): DDIMER in the last 168 hours.   Radiology/Studies:  DG Chest 2 View  Result Date: 08/06/2021 CLINICAL DATA:  Chest pain EXAM: CHEST - 2 VIEW COMPARISON:  None. FINDINGS: Left-sided ICD is present.  The heart is mildly enlarged. There is central pulmonary vascular congestion. There is some patchy airspace opacities posterior right lower lobe. There are likely small bilateral pleural  effusions. There is no evidence for pneumothorax or acute fracture. IMPRESSION: 1. Cardiomegaly with central pulmonary vascular congestion. 2. Small pleural effusions with minimal airspace disease in the posterior right lower lobe. Electronically Signed   By: Ronney Asters M.D.   On: 08/06/2021 18:08     Assessment and Plan:   #Acute on Chronic Systolic and Diastolic Heart Failure: - Noted to have lower thoracic impedence recently. I think she functionally has also slowed down and recently has been orthopneic. BNP highly elevated and now presenting with sustained ventricular arrhythmias  - Given IV lasix in the ED with robust UOP. Will hold off on further dosing this evening - Cont entresto, bisorpolol  - Sub aldactone for eplerenone while in house. Hives with dapa and empa too expensive  - Can consider repeat echo   #Ventricular Tachycardia: - Multiple sustained episodes below treatment threshold today. Likely driven by above  - Cont amio load - STOP flecainide  - Check TFTs in AM   Risk Assessment/Risk Scores:  New York Heart Association (NYHA) Functional Class NYHA Class II   Severity of Illness: The appropriate patient status for this patient is INPATIENT. Inpatient status is judged to be reasonable and necessary in order to provide the required intensity of service to ensure the patient's safety. The patient's presenting symptoms, physical exam findings, and initial radiographic and laboratory data in the context of their chronic comorbidities is felt to place them at high risk for further clinical deterioration. Furthermore, it is not anticipated that the patient will be medically stable for discharge from the hospital within 2 midnights of admission.   * I certify that at the point of admission it is my clinical judgment that the patient will require inpatient hospital care spanning beyond 2 midnights from the point of admission due to high intensity of service, high risk for further  deterioration and high frequency of surveillance required.*   For questions or updates, please contact [place] Please consult [email or web address] for contact info under    Signed, Ronaldo Miyamoto, MD  08/07/2021 12:23 AM

## 2021-08-07 NOTE — Progress Notes (Addendum)
Advanced Heart Failure Rounding Note  PCP-Cardiologist: None   Subjective:    08/06/21: Admit a/c systolic CHF + multiple runs sustained VT  Diuresed well yesterday with 40 mg lasix IV.   Feeling much better today. No recurrent presyncope. Denies dyspnea, orthopnea or PND overnight.  Mag 1.7 and K 3.1. Scr stable.   No recurrent VT on telemetry. PVC burden decreasing with amio gtt.   Objective:   Weight Range: 108.9 kg Body mass index is 43.9 kg/m.   Vital Signs:   Temp:  [98.7 F (37.1 C)-99.1 F (37.3 C)] 98.9 F (37.2 C) (01/11 0727) Pulse Rate:  [42-94] 68 (01/11 0727) Resp:  [15-25] 16 (01/11 0727) BP: (113-175)/(69-105) 155/91 (01/11 0630) SpO2:  [93 %-97 %] 93 % (01/11 0727) Weight:  [108.9 kg] 108.9 kg (01/10 1553)    Weight change: Filed Weights   08/06/21 1553  Weight: 108.9 kg    Intake/Output:   Intake/Output Summary (Last 24 hours) at 08/07/2021 0729 Last data filed at 08/06/2021 2255 Gross per 24 hour  Intake --  Output 2825 ml  Net -2825 ml      Physical Exam    General:  No distress. Sitting up in bed. HEENT: Normal Neck: Supple. JVP difficult to assess. Carotids 2+ bilat; no bruits. No lymphadenopathy or thyromegaly appreciated. Cor: PMI nondisplaced. Regular rate & rhythm. No rubs, gallops or murmurs. Lungs: Scant crackles in bases Abdomen: Soft, obese, nontender, nondistended. No hepatosplenomegaly. No bruits or masses. Good bowel sounds. Extremities: No cyanosis, clubbing, rash, trace edema Neuro: Alert & orientedx3, cranial nerves grossly intact. moves all 4 extremities w/o difficulty. Affect pleasant   Telemetry   SR 60s-70s + PVCs (burden 20s/min on arrival, averaging between 5-10/min this am)  EKG    SR 94  bpm, 2 PVCs  Labs    CBC Recent Labs    08/06/21 1641 08/07/21 0350  WBC 9.8 10.5  NEUTROABS 5.9  --   HGB 12.7 13.2  HCT 39.1 40.0  MCV 98.2 98.5  PLT 395 [id number]   Basic Metabolic Panel Recent Labs     08/06/21 1641 08/07/21 0350  NA 139 135  K 3.0* 3.1*  CL 100 98  CO2 25 26  GLUCOSE 98 159*  BUN 6 6  CREATININE 0.94 0.88  CALCIUM 8.6* 8.1*  MG 1.7 1.7   Liver Function Tests No results for input(s): AST, ALT, ALKPHOS, BILITOT, PROT, ALBUMIN in the last 72 hours. No results for input(s): LIPASE, AMYLASE in the last 72 hours. Cardiac Enzymes No results for input(s): CKTOTAL, CKMB, CKMBINDEX, TROPONINI in the last 72 hours.  BNP: BNP (last 3 results) Recent Labs    08/06/21 1641  BNP 1,244.4*    ProBNP (last 3 results) No results for input(s): PROBNP in the last 8760 hours.   D-Dimer No results for input(s): DDIMER in the last 72 hours. Hemoglobin A1C No results for input(s): HGBA1C in the last 72 hours. Fasting Lipid Panel No results for input(s): CHOL, HDL, LDLCALC, TRIG, CHOLHDL, LDLDIRECT in the last 72 hours. Thyroid Function Tests Recent Labs    08/07/21 0350  TSH 11.321*    Other results:   Imaging    DG Chest 2 View  Result Date: 08/06/2021 CLINICAL DATA:  Chest pain EXAM: CHEST - 2 VIEW COMPARISON:  None. FINDINGS: Left-sided ICD is present. The heart is mildly enlarged. There is central pulmonary vascular congestion. There is some patchy airspace opacities posterior right lower lobe. There are likely small  bilateral pleural effusions. There is no evidence for pneumothorax or acute fracture. IMPRESSION: 1. Cardiomegaly with central pulmonary vascular congestion. 2. Small pleural effusions with minimal airspace disease in the posterior right lower lobe. Electronically Signed   By: Ronney Asters M.D.   On: 08/06/2021 18:08     Medications:     Scheduled Medications:  bisoprolol  5 mg Oral BID   citalopram  40 mg Oral Daily   enoxaparin (LOVENOX) injection  40 mg Subcutaneous Q24H   pantoprazole  40 mg Oral Daily   pravastatin  80 mg Oral Daily   sacubitril-valsartan  1 tablet Oral BID   spironolactone  25 mg Oral Daily    Infusions:   amiodarone 30 mg/hr (08/07/21 0107)    PRN Medications: acetaminophen, loperamide, ondansetron (ZOFRAN) IV    Patient Profile   61 y.o. female with hx NICM s/p [employer] CRT-D, chronic systolic HF, hx VT on flecainide, OSA, GERD. Admitted with a/c systolic CHF and episodes of sustained VT.  Assessment/Plan   1. Acute on chronic systolic CHF: Long history of nonischemic cardiomyopathy.  Most recent echo in 10/19 with severe LV dilation and EF 25-30%.  Has had frequent PVCs in the past, but most recent holter in 10/19 showed PVC count down to 3.1% on flecainide.  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 RHC/LHC in 3/20 with no significant CAD, normal feeling pressures, and preserved cardiac output.  CPX showed deconditioning but no significant HF limitation. Cardiac MRI in 6/20 was a very difficult study due to pacemaker artifact, LV EF 34% but LGE was not interpretable.  Echo 05/22 showed EF 35-40% with basal inferoseptal, basal anteroseptal, and basal inferior akinesis.   - Now admitted with a/c CHF. Furosemide had recently been increased to 20 mg daily as an outpatient d/t impedance data.  CXR with evidence of CHF and small pleural effusions. BNP 1,244.  - Diuresed well with 40 mg lasix IV. Will give another 40 mg lasix IV once then potentially switch to po diuretic.  - Continue bisoprolol 5 mg bid.  - Continue Entresto 97/103 bid.  - Continue eplerenone 50 mg daily.  - She was unable to tolerate dapagliflozin due to itching and her insurance does not cover empagliflozin.  - BP elevated but has not received home meds since yesterday am. Monitor. - TSH 11.32, free T4 1.21. Monitor with amiodarone use. 2. PVCs/VT: hx VT terminated with ATP - PVC count down to 3.1% with holter in 10/19 but EF remains 25-30% by echo. 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  PVCs likely are a consequence of the cardiomyopathy.  - Has been on flecainide for PVC suppression as well as bisoprolol.  Not ideal with  structural heart disease but probably ok with no coronary disease on cath.  -Now presenting with multiple sustained episodes VT (35) below threshold for treatment. -Flecainide stopped last night. Loading with IV amio. No recurrent VT on telemetry -EP consult - Supp K (3.1) and Mag (1.7) 3. Obesity: Continue efforts at diet/exercise for weight loss.  4. Hyperlipidemia: Markedly high LDL.  She cannot tolerate statins or Zetia.  Currently on 80 mg Pravastatin daily. She will not use any injectable meds.  Had myalgias on welchol. 5. Elevated TSH: -TSH 11 and Free T4 1.2 -Follow closely with amiodarone use -I do not find prior history of hypothyroidism 6. Obesity: -Struggling with weight loss.  -Will consult dietician at her request  Length of Stay: 1  FINCH, LINDSAY N, PA-C  08/07/2021, 7:29 AM  Advanced Heart Failure Team Pager [phone] (M-F; 7a - 5p)  Please contact [facility] for night-coverage after hours (5p -7a ) and weekends on [email or web address]   Patient seen with PA, agree with the above note.   She is feeling better with IV Lasix.  Good diuresis so far.   No further ventricular arrhythmias, PVCs noted on monitor.   General: NAD Neck: No JVD, no thyromegaly or thyroid nodule.  Lungs: Clear to auscultation bilaterally with normal respiratory effort. CV: Nondisplaced PMI.  Heart regular S1/S2, no S3/S4, no murmur.  No peripheral edema.   Abdomen: Soft, nontender, no hepatosplenomegaly, no distention.  Skin: Intact without lesions or rashes.  Neurologic: Alert and oriented x 3.  Psych: Normal affect. Extremities: No clubbing or cyanosis.  HEENT: Normal.   Suspect volume overload probably related to dietary indiscretion around holidays, developed dyspnea and orthopnea.  She was started on Lasix 20 mg daily on Monday.  She was admitted yesterday with lightheadedness/nausea, found by EMS to be in VT that was treated by ATP to regain NSR.  Volume overloaded initially.   - Good  diuresis so far, got 1 more dose Lasix 40 mg IV this morning, can likely resume Lasix 20 mg daily with supplement K tomorrow.  - Continue her home meds (Coreg, Entresto, eplerenone).   - With VT and frequent PVCs (10-15% by device interrogation according to Dr. Caryl Comes), stopped flecainide and started amiodarone gtt.  Will send her home on amiodarone but would like to limit how long she continues this.  She will followup with Dr. Lovena Le to discuss VT/PVC ablation.   Elevated free T4 but also elevated TSH.  ?sick euthyroid.  Repeat as outpatient.   Loralie Champagne 08/07/2021 12:42 PM

## 2021-08-08 DIAGNOSIS — I472 Ventricular tachycardia, unspecified: Secondary | ICD-10-CM | POA: Diagnosis not present

## 2021-08-08 DIAGNOSIS — I5043 Acute on chronic combined systolic (congestive) and diastolic (congestive) heart failure: Secondary | ICD-10-CM | POA: Diagnosis not present

## 2021-08-08 LAB — BASIC METABOLIC PANEL
Anion gap: 17 — ABNORMAL HIGH (ref 5–15)
BUN: 10 mg/dL (ref 6–20)
CO2: 25 mmol/L (ref 22–32)
Calcium: 9.5 mg/dL (ref 8.9–10.3)
Chloride: 97 mmol/L — ABNORMAL LOW (ref 98–111)
Creatinine, Ser: 0.85 mg/dL (ref 0.44–1.00)
GFR, Estimated: >60 mL/min (ref >60)
Glucose, Bld: 88 mg/dL (ref 70–99)
Potassium: 4.3 mmol/L (ref 3.5–5.1)
Sodium: 139 mmol/L (ref 135–145)

## 2021-08-08 LAB — MAGNESIUM: Magnesium: 2.1 mg/dL (ref 1.7–2.4)

## 2021-08-08 MED ORDER — AMIODARONE HCL 200 MG PO TABS: ORAL_TABLET | ORAL | 0 refills | Status: DC

## 2021-08-08 MED ORDER — AMIODARONE HCL 200 MG PO TABS
400.0000 mg | ORAL_TABLET | Freq: Two times a day (BID) | ORAL | Status: DC
  Administered 2021-08-08: 400 mg via ORAL
  Filled 2021-08-08: qty 2

## 2021-08-08 MED ORDER — POTASSIUM CHLORIDE CRYS ER 20 MEQ PO TBCR: 20.0000 meq | EXTENDED_RELEASE_TABLET | Freq: Every day | ORAL | 3 refills | Status: DC

## 2021-08-08 MED ORDER — FUROSEMIDE 20 MG PO TABS
20.0000 mg | ORAL_TABLET | Freq: Every day | ORAL | Status: DC
  Administered 2021-08-08: 20 mg via ORAL
  Filled 2021-08-08: qty 1

## 2021-08-08 MED ORDER — MAGNESIUM OXIDE -MG SUPPLEMENT 400 (240 MG) MG PO TABS: 400.0000 mg | ORAL_TABLET | Freq: Two times a day (BID) | ORAL | 6 refills | Status: DC

## 2021-08-08 MED ORDER — POTASSIUM CHLORIDE CRYS ER 20 MEQ PO TBCR
20.0000 meq | EXTENDED_RELEASE_TABLET | Freq: Every day | ORAL | Status: DC
  Administered 2021-08-08: 20 meq via ORAL
  Filled 2021-08-08: qty 1

## 2021-08-08 NOTE — Discharge Summary (Addendum)
ELECTROPHYSIOLOGY DISCHARGE SUMMARY    Patient ID: Alison Howard,  MRN: [id number], DOB/AGE: 61-11-1960 61 y.o.  Admit date: 08/06/2021 Discharge date: 08/08/2021  Primary Care Physician: Shirline Frees, MD  Primary Cardiologist: Dr. Aundra Dubin  Electrophysiologist: Dr. Caryl Comes  Primary Discharge Diagnosis:  Ventricular Tachycardia Acute on chronic systolic CHF  Secondary Discharge Diagnosis:  Obesity HLD Elevated TSH  Procedures This Admission:  Adjustment of ICD ATP schemes.  Will now provide ATP for VT > 137 bpm after 40 beats. HV therapy won't start until after 200 bpm  Brief HPI: Alison Howard is a 61 y.o. female with a history of NICM s/p 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 Clayton Bibles.  Hospital Course:  The patient was admitted with near syncope and found to VT hovering around detection on her device. This also in the setting of HF symptoms over several days with increase in her diuretics and subsequent hypokalemia likely contributing. Her flecainide was stopped in the setting of VT, and replaced with amiodarone which was first loaded by IV and transitioned to po.  Her VT and PVC were documented by EKG for consideration of ablation as an outpatient. They were monitored on telemetry overnight which demonstrated NSR with quieting of her PVCs on amiodarone. Volume status improved much with IV diuretics, and K stabilized with supp. The patient was examined by both HF and EP teams today and considered to be stable for discharge.  NCDMV restrictions of no driving x 6 months were discussed given symptomatic VT. VT therapy adjusted as above. The patient will be seen back by  Dr. Lovena Le in 3-4 weeks for post hospital care and to consider ablation. Would taper off amiodarone as bridge to potential ablation.   Physical Exam: Vitals:   08/07/21 1545 08/07/21 1940 08/08/21 0601 08/08/21 0729   BP: 110/75 129/77 134/77 116/78  Pulse: 69 65 60 65  Resp: (!) 27 17 16 20   Temp:  98.6 F (37 C) (!) 97.5 F (36.4 C) 98.1 F (36.7 C)  TempSrc:  Oral Oral Oral  SpO2: 92% 93% 96% 94%  Weight: 113.3 kg     Height: 5\' 2"  (1.575 m)       GEN- The patient is well appearing, alert and oriented x 3 today.   HEENT: normocephalic, atraumatic; sclera clear, conjunctiva pink; hearing intact; oropharynx clear; neck supple  Lungs- Clear to ausculation bilaterally, normal work of breathing.  No wheezes, rales, rhonchi Heart- Regular rate and rhythm, no murmurs, rubs or gallops  GI- soft, non-tender, non-distended, bowel sounds present  Extremities- no clubbing, cyanosis, or edema; DP/PT/radial pulses 2+ bilaterally, groin without hematoma/bruit MS- no significant deformity or atrophy Skin- warm and dry, no rash or lesion Psych- euthymic mood, full affect Neuro- strength and sensation are intact   Labs:   Lab Results  Component Value Date   WBC 10.5 08/07/2021   HGB 13.2 08/07/2021   HCT 40.0 08/07/2021   MCV 98.5 08/07/2021   PLT 359 08/07/2021    Recent Labs  Lab 08/08/21 0329  NA 139  K 4.3  CL 97*  CO2 25  BUN 10  CREATININE 0.85  CALCIUM 9.5  GLUCOSE 88     Discharge Medications:  Allergies as of 08/08/2021       Reactions   Ciprofloxacin Hcl Anaphylaxis   Codeine Anaphylaxis, Nausea And Vomiting   Morphine And Related Anaphylaxis  Sulfa Antibiotics Anaphylaxis   Zetia [ezetimibe] Rash   Rash and muscle aches   Atorvastatin    Myalgias    Dapagliflozin Hives   Rosuvastatin    Myalgias   Simvastatin    Myalgias    Welchol [colesevelam]    myalgias        Medication List     STOP taking these medications    flecainide 50 MG tablet Commonly known as: TAMBOCOR       TAKE these medications    amiodarone 200 MG tablet Commonly known as: PACERONE Take 2 tablets (400 mg total) by mouth 2 (two) times daily for 14 days, THEN 1 tablet (200 mg  total) 2 (two) times daily for 14 days, THEN 1 tablet (200 mg total) daily. Start taking on: August 08, 2021   bisoprolol 5 MG tablet Commonly known as: ZEBETA Take 1 tablet (5 mg total) by mouth 2 (two) times daily.   citalopram 40 MG tablet Commonly known as: CELEXA Take 40 mg by mouth daily.   Entresto 97-103 MG Generic drug: sacubitril-valsartan Take 1 tablet by mouth 2 (two) times daily.   eplerenone 50 MG tablet Commonly known as: INSPRA TAKE 1 TABLET BY MOUTH EVERY DAY   furosemide 20 MG tablet Commonly known as: LASIX Take 1 tablet (20 mg total) by mouth daily.   LORazepam 0.5 MG tablet Commonly known as: ATIVAN Take 0.5 mg by mouth 2 (two) times daily as needed for anxiety.   magnesium oxide 400 (240 Mg) MG tablet Commonly known as: MAG-OX Take 1 tablet (400 mg total) by mouth 2 (two) times daily.   pantoprazole 40 MG tablet Commonly known as: PROTONIX Take 40 mg by mouth 2 (two) times daily.   potassium chloride SA 20 MEQ tablet Commonly known as: KLOR-CON M Take 1 tablet (20 mEq total) by mouth daily. Start taking on: August 09, 2021   pravastatin 80 MG tablet Commonly known as: PRAVACHOL Take up to 1 tablet daily as tolerated What changed:  how much to take how to take this when to take this additional instructions        Disposition:    Follow-up Information     Evans Lance, MD Follow up.   Specialty: Cardiology Why: on 2/2 at 315 pm for post hospital follow up and to discuss possible PVC/VT ablation Contact information: [address] [place] [address] [phone]         Larey Dresser, MD Follow up.   Specialty: Cardiology Why: on 1/18 at 0830 for post hospital heart failure follow up Contact information: [place] [address] [phone]                 Duration of Discharge Encounter: Greater than 30 minutes including physician time.  Signed, Shirley Friar, PA-C  08/08/2021 9:58 AM  No recurrent VT Continue amiodarone and taper to 200 qd  Will refer to [facility] for consideration of ablation. Driving restriction reviewed  6 mon

## 2021-08-08 NOTE — TOC CM/SW Note (Signed)
HF TOC CM reviewed chart for dc needs. No dc needs identified. Ellieana Dolecki RN3 CCM, Heart Failure TOC CM [phone]  

## 2021-08-08 NOTE — Progress Notes (Addendum)
Advanced Heart Failure Rounding Note  PCP-Cardiologist: None   Subjective:    08/06/21: Admit a/c systolic CHF + multiple runs sustained VT  Flecainide stopped. Loading on IV  amio. No further VT  Diuresing with IV lasix. Negative 2.3   Feeling much better. Denies SOB.   Objective:   Weight Range: 108.9 kg Body mass index is 43.9 kg/m.   Vital Signs:   Temp:  [97.5 F (36.4 C)-98.6 F (37 C)] 97.5 F (36.4 C) (01/12 0601) Pulse Rate:  [60-77] 60 (01/12 0601) Resp:  [15-27] 16 (01/12 0601) BP: (93-136)/(75-93) 134/77 (01/12 0601) SpO2:  [90 %-96 %] 96 % (01/12 0601) Last BM Date: 08/07/21  Weight change: Filed Weights   08/06/21 1553  Weight: 108.9 kg    Intake/Output:   Intake/Output Summary (Last 24 hours) at 08/08/2021 0727 Last data filed at 08/08/2021 [id number] Gross per 24 hour  Intake 1381.96 ml  Output 3700 ml  Net -2318.04 ml      Physical Exam    General:  Well appearing. No resp difficulty HEENT: normal Neck: supple. no JVD. Carotids 2+ bilat; no bruits. No lymphadenopathy or thryomegaly appreciated. Cor: PMI nondisplaced. Regular rate & rhythm. No rubs, gallops or murmurs. Lungs: clear Abdomen: soft, nontender, nondistended. No hepatosplenomegaly. No bruits or masses. Good bowel sounds. Extremities: no cyanosis, clubbing, rash, edema Neuro: alert & orientedx3, cranial nerves grossly intact. moves all 4 extremities w/o difficulty. Affect pleasant   Telemetry  SR 60s with occasional PVCs 60s  EKG    SR 94  bpm, 2 PVCs  Labs    CBC Recent Labs    08/06/21 1641 08/07/21 0350  WBC 9.8 10.5  NEUTROABS 5.9  --   HGB 12.7 13.2  HCT 39.1 40.0  MCV 98.2 98.5  PLT 395 [id number]   Basic Metabolic Panel Recent Labs    08/07/21 0350 08/07/21 1507 08/08/21 0329  NA 135 134* 139  K 3.1* 4.1 4.3  CL 98 95* 97*  CO2 26 27 25   GLUCOSE 159* 118* 88  BUN 6 8 10   CREATININE 0.88 0.78 0.85  CALCIUM 8.1* 9.0 9.5  MG 1.7  --  2.1   Liver  Function Tests No results for input(s): AST, ALT, ALKPHOS, BILITOT, PROT, ALBUMIN in the last 72 hours. No results for input(s): LIPASE, AMYLASE in the last 72 hours. Cardiac Enzymes No results for input(s): CKTOTAL, CKMB, CKMBINDEX, TROPONINI in the last 72 hours.  BNP: BNP (last 3 results) Recent Labs    08/06/21 1641  BNP 1,244.4*    ProBNP (last 3 results) No results for input(s): PROBNP in the last 8760 hours.   D-Dimer No results for input(s): DDIMER in the last 72 hours. Hemoglobin A1C No results for input(s): HGBA1C in the last 72 hours. Fasting Lipid Panel No results for input(s): CHOL, HDL, LDLCALC, TRIG, CHOLHDL, LDLDIRECT in the last 72 hours. Thyroid Function Tests Recent Labs    08/07/21 0350  TSH 11.321*    Other results:   Imaging    No results found.   Medications:     Scheduled Medications:  bisoprolol  5 mg Oral BID   citalopram  40 mg Oral Daily   enoxaparin (LOVENOX) injection  40 mg Subcutaneous Q24H   magnesium oxide  400 mg Oral BID   pantoprazole  40 mg Oral BID   pravastatin  80 mg Oral Daily   sacubitril-valsartan  1 tablet Oral BID   spironolactone  25 mg Oral Daily  Infusions:  amiodarone 30 mg/hr (08/07/21 2020)    PRN Medications: acetaminophen, loperamide, ondansetron (ZOFRAN) IV    Patient Profile   61 y.o. female with hx NICM s/p St. Jude CRT-D, chronic systolic HF, hx VT on flecainide, OSA, GERD. Admitted with a/c systolic CHF and episodes of sustained VT.  Assessment/Plan   1. Acute on chronic systolic CHF: Long history of nonischemic cardiomyopathy.  Most recent echo in 10/19 with severe LV dilation and EF 25-30%.  Has had frequent PVCs in the past, but most recent holter in 10/19 showed PVC count down to 3.1% on flecainide.  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 RHC/LHC in 3/20 with no significant CAD, normal feeling pressures, and preserved cardiac output.  CPX showed deconditioning but no significant HF  limitation. Cardiac MRI in 6/20 was a very difficult study due to pacemaker artifact, LV EF 34% but LGE was not interpretable.  Echo 05/22 showed EF 35-40% with basal inferoseptal, basal anteroseptal, and basal inferior akinesis.   - Now admitted with a/c CHF. Furosemide had recently been increased to 20 mg daily as an outpatient d/t impedance data.  CXR with evidence of CHF and small pleural effusions. BNP 1,244.  - Volume status improved.  Start  lasix 20 mg po daily. + 20 meq Kdur daily  - Continue bisoprolol 5 mg bid.  - Continue Entresto 97/103 bid.  - Continue eplerenone 50 mg daily.  - She was unable to tolerate dapagliflozin due to itching and her insurance does not cover empagliflozin.  - Renal function stable.  - Discussed daily weights.  2. PVCs/VT: hx VT terminated with ATP - PVC count down to 3.1% with holter in 10/19 but EF remains 25-30% by echo. 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  PVCs likely are a consequence of the cardiomyopathy.  - Has been on flecainide for PVC suppression as well as bisoprolol.  Not ideal with structural heart disease but probably ok with no coronary disease on cath.  -Presented with multiple sustained episodes VT (35) below threshold for treatment. -Flecainide stopped . Loaded with IV amio. No recurrent VT on telemetry today.  Per EP stop IV amio and transition to po amio today with taper in place.  -She has f/u with Dr Lovena Le posssible ablation-->PVC/VT in the office 08/29/21 - K and Mag stable today.  3. Obesity: Body mass index is 43.9 kg/m. Continue efforts at diet/exercise for weight Dietitian consulted.   4. Hyperlipidemia: Markedly high LDL.  She cannot tolerate statins or Zetia.  Currently on 80 mg Pravastatin daily. She will not use any injectable meds.  Had myalgias on welchol. 5. Elevated TSH: -TSH 11 and Free T4 1.2 -Follow closely with amiodarone use - Dr Caryl Comes discussed screening recommendations for Amio use.   We will set up HF  follow up next week.    Length of Stay: 2  Darrick Grinder, NP  08/08/2021, 7:27 AM  Advanced Heart Failure Team Pager [phone] (M-F; 7a - 5p)  Please contact [facility] for night-coverage after hours (5p -7a ) and weekends on [email or web address]   Patient seen with NP, agree with the above note.   Feels much better with diuresis.  No further VT, occasional PVCs.   General: NAD Neck: No JVD, no thyromegaly or thyroid nodule.  Lungs: Clear to auscultation bilaterally with normal respiratory effort. CV: Nondisplaced PMI.  Heart regular S1/S2, no S3/S4, no murmur.  No peripheral edema.   Abdomen: Soft, nontender, no hepatosplenomegaly, no distention.  Skin: Intact  without lesions or rashes.  Neurologic: Alert and oriented x 3.  Psych: Normal affect. Extremities: No clubbing or cyanosis.  HEENT: Normal.   Volume status improved.  She can go home today.  Will have her on Lasix 20 mg daily + KCl 20 mEq daily.   She will stop flecainide and will be on amiodarone taper going home.  She will need amiodarone screening labs followed.  She will see Dr. Lovena Le as outpatient for consideration of PVC/VT ablation.   Loralie Champagne 08/08/2021 8:57 AM

## 2021-08-08 NOTE — Plan of Care (Signed)
Nutrition Education Note  RD consulted for nutrition education regarding CHF.  RD provided "Low Sodium Nutrition Therapy" handout from the Academy of [employer] Dietetics. Reviewed patient's dietary recall. Provided examples on ways to decrease sodium intake in diet. Discouraged intake of processed foods and use of salt shaker. Encouraged fresh fruits and vegetables as well as whole grain sources of carbohydrates to maximize fiber intake.   Also discussed Low-FODMAP, low/no carbohydrate, and gluten-free diets at great length. Discussed the importance of adding foods back in with the FODMAP diet. Pt reports she had a diverticulitis episode 3 months ago, and she is in the process of seeing a GI specialist for a potential IBS diagnosis. Also discussed the lack of purpose for low/no carbohydrate, as she does not have diabetes, and it was prescribed by her doctor for weight loss.  RD discussed why it is important for patient to adhere to diet recommendations, and emphasized the role of fluids, foods to avoid, and importance of weighing self daily. Teach back method used.  Expect fair compliance.  Body mass index is 45.69 kg/m.   Current diet order is Heart Healthy/Carb, patient is consuming approximately 60-100% of meals at this time. Labs and medications reviewed.   No further nutrition interventions warranted at this time. RD contact information provided. If additional nutrition issues arise, please re-consult RD.   Vertell Limber, RD, LDN (she/her/hers) Clinical Inpatient Dietitian RD Pager/After-Hours/Weekend Pager # in [place]

## 2021-08-09 ENCOUNTER — Ambulatory Visit (INDEPENDENT_AMBULATORY_CARE_PROVIDER_SITE_OTHER): Payer: BC Managed Care – PPO

## 2021-08-09 DIAGNOSIS — I5022 Chronic systolic (congestive) heart failure: Secondary | ICD-10-CM

## 2021-08-09 DIAGNOSIS — Z95810 Presence of automatic (implantable) cardiac defibrillator: Secondary | ICD-10-CM

## 2021-08-09 NOTE — Progress Notes (Signed)
Received: Today Sherald Hess, NP  Denis Koppel, Josephine Igo, RN; Laurey Morale, MD Yes looks great!.  She was placed on lasix 20 mg daily.  Will continue and see her next week.   Thanks, Amy

## 2021-08-09 NOTE — Progress Notes (Signed)
Spoke with patient and advised Amy recommended to stay on Lasix 20 mg daily and will see her in the office next week.  She verbalized understanding.

## 2021-08-09 NOTE — Progress Notes (Signed)
EPIC Encounter for ICM Monitoring  Patient Name: Alison Howard is a 61 y.o. female Date: 08/09/2021 Primary Care Physican: Johny Blamer, MD Primary Cardiologist: Shirlee Latch Electrophysiologist: Graciela Husbands 07/02/2021 Office Weight: 236 lbs  08/09/2021 Weight: 225 lbs         Spoke with patient and heart failure questions reviewed.  Pt is feeling better after being diuresed in the hospital but having very little urine output today.    Hospitalization 1/10-1/12 with Dx of Ventricular Tachycardia and Acute on chronic systolic CHF.      [place]vue thoracic impedance suggesting fluid levels changed from possible accumulation to possible dryness which correlates with hospitalization and diuresis on 1/10.   Prescribed:  Furosemide 20 mg Take 1 tablet (20 mg) by mouth daily.  Potassium 20 mEq take 1 tablet by mouth daily.   Labs: 08/08/2021 Creatinine 0.85, BUN 10, Potassium 4.3, Sodium 139, GFR >60 08/07/2021 Creatinine 0.78, BUN 8,   Potassium 4.1, Sodium 134, GFR >60 (3:07 PM) 08/07/2021 Creatinine 0.88, BUN 6,   Potassium 3.1, Sodium 135, GFR >60 (3:50 AM) 08/06/2020 Creatinine 0.94, BUN 6,   Potassium 3.0, Sodium 139, GFR >60  03/27/2021 Creatinine 1.05, BUN 12, Potassium 4.0, Sodium 136, GFR >60 12/25/2020 Creatinine 0.92, BUN 10, Potassium 4.0, Sodium 137, GFR >60 09/19/2020 Creatinine 0.86, BUN 10, Potassium 4.2, Sodium 137, GFR >60 A complete set of results can be found in Results Review.   Recommendations: She has resumed Furosemide 20 mg daily since 1/12 hospital discharge.  Copy sent to Dr Shirlee Latch and Tonye Becket, NP (pt said Amy discharged her yesterday) for recommendations if needed.     Follow-up plan: ICM clinic phone appointment on 08/19/2021 to recheck fluid levels.   91 day device clinic remote transmission 10/04/2021.     EP/Cardiology next office visit:   08/14/2021 with Dr Shirlee Latch.  08/29/2021 with Dr Ladona Ridgel.   Copy of ICM check sent to Dr. Graciela Husbands.      3 month ICM trend:  08/09/2021.    12-14 Month ICM trend:     Karie Soda, RN 08/09/2021 8:20 AM

## 2021-08-14 ENCOUNTER — Ambulatory Visit (HOSPITAL_COMMUNITY)
Admission: RE | Admit: 2021-08-14 | Discharge: 2021-08-14 | Disposition: A | Discharge status: Home / Self Care | Payer: BC Managed Care – PPO | Source: Ambulatory Visit | Attending: Cardiology | Admitting: Cardiology

## 2021-08-14 ENCOUNTER — Other Ambulatory Visit: Payer: Self-pay

## 2021-08-14 ENCOUNTER — Other Ambulatory Visit (HOSPITAL_COMMUNITY): Payer: BC Managed Care – PPO

## 2021-08-14 ENCOUNTER — Encounter (HOSPITAL_COMMUNITY): Payer: Self-pay

## 2021-08-14 ENCOUNTER — Encounter (HOSPITAL_COMMUNITY): Payer: Self-pay | Admitting: Cardiology

## 2021-08-14 VITALS — BP 90/60 | HR 65 | Wt 224.2 lb

## 2021-08-14 DIAGNOSIS — I5022 Chronic systolic (congestive) heart failure: Secondary | ICD-10-CM | POA: Insufficient documentation

## 2021-08-14 DIAGNOSIS — E669 Obesity, unspecified: Secondary | ICD-10-CM | POA: Diagnosis not present

## 2021-08-14 DIAGNOSIS — Z79899 Other long term (current) drug therapy: Secondary | ICD-10-CM | POA: Diagnosis not present

## 2021-08-14 DIAGNOSIS — I493 Ventricular premature depolarization: Secondary | ICD-10-CM | POA: Diagnosis not present

## 2021-08-14 DIAGNOSIS — I428 Other cardiomyopathies: Secondary | ICD-10-CM | POA: Diagnosis not present

## 2021-08-14 DIAGNOSIS — Z6841 Body Mass Index (BMI) 40.0 and over, adult: Secondary | ICD-10-CM | POA: Diagnosis not present

## 2021-08-14 DIAGNOSIS — E785 Hyperlipidemia, unspecified: Secondary | ICD-10-CM | POA: Diagnosis not present

## 2021-08-14 LAB — COMPREHENSIVE METABOLIC PANEL
ALT: 15 U/L (ref 0–44)
AST: 18 U/L (ref 15–41)
Albumin: 4.2 g/dL (ref 3.5–5.0)
Alkaline Phosphatase: 57 U/L (ref 38–126)
Anion gap: 12 (ref 5–15)
BUN: 20 mg/dL (ref 6–20)
CO2: 24 mmol/L (ref 22–32)
Calcium: 9.5 mg/dL (ref 8.9–10.3)
Chloride: 98 mmol/L (ref 98–111)
Creatinine, Ser: 1.15 mg/dL — ABNORMAL HIGH (ref 0.44–1.00)
GFR, Estimated: 55 mL/min — ABNORMAL LOW (ref >60)
Glucose, Bld: 105 mg/dL — ABNORMAL HIGH (ref 70–99)
Potassium: 4.4 mmol/L (ref 3.5–5.1)
Sodium: 134 mmol/L — ABNORMAL LOW (ref 135–145)
Total Bilirubin: 0.7 mg/dL (ref 0.3–1.2)
Total Protein: 7.8 g/dL (ref 6.5–8.1)

## 2021-08-14 MED ORDER — POTASSIUM CHLORIDE CRYS ER 20 MEQ PO TBCR: 20.0000 meq | EXTENDED_RELEASE_TABLET | ORAL | 3 refills | Status: DC

## 2021-08-14 MED ORDER — FUROSEMIDE 20 MG PO TABS: 20.0000 mg | ORAL_TABLET | ORAL | 3 refills | Status: DC

## 2021-08-14 NOTE — Progress Notes (Addendum)
ID:  Alison Howard, DOB 1960/11/20, MRN [id number]   Provider location: [facility] Type of Visit: Established patient  PCP:  Shirline Frees, MD  Cardiologist:  Dr. Aundra Dubin   History of Present Illness: Alison Howard is a 61 y.o. female who has a history of CHF and VT and was referred by Dr. Caryl Comes for evaluation of CHF. She has a long history of cardiomyopathy, dating back to at least 2013.  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  She has had VT in the past terminated by [facility].  She has also had up to 22% PVCs in the past, down to 3.1% on 10/19 holter. She is currently on flecainide to suppress the PVCs.  Echo in 12/19 showed severe dilation of the LV with EF 25-30%.    RHC/LHC in 3/20 showed normal filling pressures, CI 2.37, and no significant CAD. CPX in 3/20 showed no significant HF limitation, main problem appeared to be deconditioning.  Echo in 5/22 showed EF 35-40% with basal inferoseptal, basal anteroseptal, and basal inferior akinesis.   She was admitted in 1/23 with CHF and runs of NSVT.  ICD did not discharge. She was diuresed and flecainide was stopped and replaced with amiodarone. No further VT.   She returns today for followup of CHF.  Breathing is much improved on Lasix.  Weight is down. No dyspnea walking on flat ground.  No orthopnea/PND.  No lightheadedness.     St Jude device interrogation: thoracic impedance heading up    Labs (10/19): K 4.3, creatinine 0.88 Labs (3/20): K 4.3, creatinine 0.88 Labs (6/20): K 4.4, creatinine 1.02 Labs (11/20): K 4.5 creatinine 0.88, hgb 14.4 Labs (9/21): K 4, creatinine 0.96, LDL 185, HDL 70 Labs (2/22): LDL 173, K 4.2, creatinine 0.86 Labs (1/23): K 4.3, creatinine 0.85, TSH and free T4 elevated  ECG (personally reviewed): NSR, BiV paced.    PMH:  1. Chronic systolic CHF: Nonischemic cardiomyopathy. St Jude BiV ICD.  - Echo (8/13): EF 40-45% - Echo (12/15): EF 25-30% - Echo (6/17): EF 35% - Echo (6/18): EF  40-45% - Echo (11/18): EF 35-40% - Echo (12/19): EF 25-30%, severe LV dilation, mild MR.  - RHC/LHC (3/20): no CAD.  Mean RA 4, PA 27/10 mean 16, mean PCWP 5, CI 2.37.  - CPX (3/20): peak VO2 16.9 (97% predicted), VE/VCO2 23, RER 1.14 => no significant HF limitation, primarily limited by deconditioning.  - Cardiac MRI (6/20): Very difficult study due to pacemaker artifact.  EF 34%, delayed enhancement images not interpretable.  - Echo (5/22): EF 35-40% with basal inferoseptal, basal anteroseptal, and basal inferior akinesis.  2. PVCs/VT:  - 6/18 holter 22% PVCs - 10/19 holter 3.1% PVCs 3. OSA 4. Hyperlipidemia: Unable to tolerate statins, Zetia.  5. Obesity  Current Outpatient Medications  Medication Sig Dispense Refill   amiodarone (PACERONE) 200 MG tablet Take 2 tablets (400 mg total) by mouth 2 (two) times daily for 14 days, THEN 1 tablet (200 mg total) 2 (two) times daily for 14 days, THEN 1 tablet (200 mg total) daily. 114 tablet 0   bisoprolol (ZEBETA) 5 MG tablet Take 1 tablet (5 mg total) by mouth 2 (two) times daily. 180 tablet 3   citalopram (CELEXA) 40 MG tablet Take 40 mg by mouth daily.      eplerenone (INSPRA) 50 MG tablet TAKE 1 TABLET BY MOUTH EVERY DAY 90 tablet 2   LORazepam (ATIVAN) 0.5 MG tablet Take 0.5  mg by mouth 2 (two) times daily as needed for anxiety.     magnesium oxide (MAG-OX) 400 (240 Mg) MG tablet Take 1 tablet (400 mg total) by mouth 2 (two) times daily. 60 tablet 6   pantoprazole (PROTONIX) 40 MG tablet Take 40 mg by mouth 2 (two) times daily.      pravastatin (PRAVACHOL) 80 MG tablet Take 80 mg by mouth daily.     sacubitril-valsartan (ENTRESTO) 97-103 MG Take 1 tablet by mouth 2 (two) times daily. 180 tablet 3   furosemide (LASIX) 20 MG tablet Take 1 tablet (20 mg total) by mouth every other day. 90 tablet 3   potassium chloride SA (KLOR-CON M) 20 MEQ tablet Take 1 tablet (20 mEq total) by mouth every other day. 90 tablet 3   No current  facility-administered medications for this encounter.    Allergies:   Ciprofloxacin hcl, Codeine, Morphine and related, Sulfa antibiotics, Zetia [ezetimibe], Atorvastatin, Dapagliflozin, Rosuvastatin, Simvastatin, and Welchol [colesevelam]   Social History:  The patient  reports that she has never smoked. She has never used smokeless tobacco. She reports current alcohol use. She reports that she does not use drugs.   Family History:  The patient's family history includes Coronary artery disease in her mother; Heart attack in her mother; Heart disease in her father; Hyperlipidemia in her sister.   ROS:  Please see the history of present illness.   All other systems are personally reviewed and negative.   Exam:   BP 90/60    Pulse 65    Wt 101.7 kg (224 lb 3.2 oz)    SpO2 95%    BMI 41.01 kg/m  General: NAD Neck: No JVD, no thyromegaly or thyroid nodule.  Lungs: Clear to auscultation bilaterally with normal respiratory effort. CV: Nondisplaced PMI.  Heart regular S1/S2, no S3/S4, no murmur.  No peripheral edema.  No carotid bruit.  Normal pedal pulses.  Abdomen: Soft, nontender, no hepatosplenomegaly, no distention.  Skin: Intact without lesions or rashes.  Neurologic: Alert and oriented x 3.  Psych: Normal affect. Extremities: No clubbing or cyanosis.  HEENT: Normal.   Recent Labs: 08/06/2021: B Natriuretic Peptide 1,244.4 08/07/2021: Hemoglobin 13.2; Platelets 359; TSH 11.321 08/08/2021: Magnesium 2.1 08/14/2021: ALT 15; BUN 20; Creatinine, Ser 1.15; Potassium 4.4; Sodium 134  Personally reviewed   Wt Readings from Last 3 Encounters:  08/14/21 101.7 kg (224 lb 3.2 oz)  08/07/21 113.3 kg (249 lb 12.5 oz)  06/28/21 104.3 kg (230 lb)     ASSESSMENT AND PLAN:  1. Chronic systolic CHF: Long history of nonischemic cardiomyopathy.  Most recent echo in 10/19 with severe LV dilation and EF 25-30%.  Has had frequent PVCs in the past, but most recent holter in 10/19 showed PVC count down to  3.1% on flecainide.  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 RHC/LHC in 3/20 with no significant CAD, normal feeling pressures, and preserved cardiac output.  CPX showed deconditioning but no significant HF limitation. Cardiac MRI in 6/20 was a very difficult study due to pacemaker artifact, LV EF 34% but LGE was not interpretable.  Echo in 5/22 showed EF 35-40% with basal inferoseptal, basal anteroseptal, and basal inferior akinesis.  Recent CHF exacerbation, now doing much better. NYHA class II symptoms, not volume overloaded by exam or Corvue today.   - Continue bisoprolol 5 mg bid.  - Continue Entresto 97/103 bid.  - Continue eplerenone 50 mg daily. BMET today.  - She was unable to tolerate dapagliflozin due  to itching and her insurance does not cover empagliflozin.  - She can decrease Lasix to 20 mg qod and KCl to 10 mEq qod.   2. PVCs: Count down to 3.1% with holter in 10/19 but EF remained 25-30% by echo. Suspect not primarily a PVC-mediated CMP therefore.  PVCs likely are a consequence of the cardiomyopathy.  She had symptomatic NSVT runs in 1/23 and flecainide was stopped, amiodarone started.  No palpitations.  - Continue amiodarone, tapering down to 200 mg daily.  Would like to avoid long-term amiodarone use.  Will need to check TFTs and LFTs. She will need a regular eye exam.  - She was seen by EP in the hospital in 1/23 and has followup with Dr. Lovena Le in 2/23 to discuss PVC/VT ablation.   3. Obesity: Continue efforts at diet/exercise for weight loss.  4. Hyperlipidemia: Markedly high LDL.  She cannot tolerate statins or Zetia.  She will not use any injectable meds.    Followup in 6 wks with APP.      Signed, Loralie Champagne, MD  08/14/2021  [place] [address] [facility] and [place] [address] [phone] (office) [phone] (fax)

## 2021-08-14 NOTE — Patient Instructions (Signed)
Decrease Furosemide to 20 mg every other day   Decrease Potassium to 20 meq every other day   Labs done today, your results will be available in MyChart, we will contact you for abnormal readings.  Your physician recommends that you schedule a follow-up appointment in: 6 weeks  If you have any questions or concerns before your next appointment please send [place] a message through [place] or call our office at [phone].    TO LEAVE A MESSAGE FOR THE NURSE SELECT OPTION 2, PLEASE LEAVE A MESSAGE INCLUDING: YOUR NAME DATE OF BIRTH CALL BACK NUMBER REASON FOR CALL**this is important as we prioritize the call backs  YOU WILL RECEIVE A CALL BACK THE SAME DAY AS LONG AS YOU CALL BEFORE 4:00 PM  At the [facility], you and your health needs are our priority. As part of our continuing mission to provide you with exceptional heart care, we have created designated Provider Care Teams. These Care Teams include your primary Cardiologist (physician) and Advanced Practice Providers (APPs- Physician Assistants and Nurse Practitioners) who all work together to provide you with the care you need, when you need it.   You may see any of the following providers on your designated Care Team at your next follow up: Dr Arvilla Meres Dr Carron Curie, NP Robbie Lis, [place] [facility] [place], [place] Karle Plumber, PharmD   Please be sure to bring in all your medications bottles to every appointment.

## 2021-08-15 ENCOUNTER — Other Ambulatory Visit (HOSPITAL_COMMUNITY): Payer: Self-pay

## 2021-08-15 DIAGNOSIS — I5022 Chronic systolic (congestive) heart failure: Secondary | ICD-10-CM | POA: Diagnosis not present

## 2021-08-15 DIAGNOSIS — Z09 Encounter for follow-up examination after completed treatment for conditions other than malignant neoplasm: Secondary | ICD-10-CM | POA: Diagnosis not present

## 2021-08-19 ENCOUNTER — Ambulatory Visit (INDEPENDENT_AMBULATORY_CARE_PROVIDER_SITE_OTHER): Payer: BC Managed Care – PPO

## 2021-08-19 DIAGNOSIS — Z95810 Presence of automatic (implantable) cardiac defibrillator: Secondary | ICD-10-CM

## 2021-08-19 DIAGNOSIS — I5022 Chronic systolic (congestive) heart failure: Secondary | ICD-10-CM

## 2021-08-19 NOTE — Progress Notes (Signed)
EPIC Encounter for ICM Monitoring  Patient Name: Alison Howard is a 61 y.o. female Date: 08/19/2021 Primary Care Physican: Shirline Frees, MD Primary Cardiologist: Aundra Dubin Electrophysiologist: Caryl Comes 07/02/2021 Office Weight: 236 lbs  08/09/2021 Weight: 225 lbs         Spoke with patient and heart failure questions reviewed.  She forgot to decrease Furosemide to every other day as instructed by Dr Aundra Dubin on 1/18.        Corvue thoracic impedance suggesting possible ongoing dryness and has not returned to baseline normal since being diuresed in hospital 1/10.   Prescribed:  Furosemide 20 mg Take 1 tablet (20 mg) by mouth every other day.  Potassium 20 mEq take 1 tablet by mouth every other day.   Labs: 08/14/2021 Creatinine 1.15, BUN 20, Potassium 4.4, Sodium 134, GFR 55 08/08/2021 Creatinine 0.85, BUN 10, Potassium 4.3, Sodium 139, GFR >60 08/07/2021 Creatinine 0.78, BUN 8,   Potassium 4.1, Sodium 134, GFR >60 (3:07 PM) 08/07/2021 Creatinine 0.88, BUN 6,   Potassium 3.1, Sodium 135, GFR >60 (3:50 AM) 08/06/2020 Creatinine 0.94, BUN 6,   Potassium 3.0, Sodium 139, GFR >60  03/27/2021 Creatinine 1.05, BUN 12, Potassium 4.0, Sodium 136, GFR >60 12/25/2020 Creatinine 0.92, BUN 10, Potassium 4.0, Sodium 137, GFR >60 09/19/2020 Creatinine 0.86, BUN 10, Potassium 4.2, Sodium 137, GFR >60 A complete set of results can be found in Results Review.   Recommendations: Advised to hold Lasix x 2 days and increase fluid intake and then start Furosemide every other day as instructed at last OV.   Follow-up plan: ICM clinic phone appointment on 08/27/2021 to recheck fluid levels.   91 day device clinic remote transmission 10/04/2021.     EP/Cardiology next office visit:  09/27/2021 with [facility] PA/NP.  08/29/2021 with Dr Lovena Le.   Copy of ICM check sent to Dr. Caryl Comes.    Copy sent to Dr Aundra Dubin as Juluis Rainier and review.  3 month ICM trend: 08/19/2021.    12-14 Month ICM trend:     [name],  RN 08/19/2021 11:43 AM

## 2021-08-26 ENCOUNTER — Other Ambulatory Visit: Payer: BC Managed Care – PPO | Admitting: *Deleted

## 2021-08-26 ENCOUNTER — Other Ambulatory Visit: Payer: Self-pay

## 2021-08-26 DIAGNOSIS — E7800 Pure hypercholesterolemia, unspecified: Secondary | ICD-10-CM | POA: Diagnosis not present

## 2021-08-26 LAB — LIPID PANEL
Chol/HDL Ratio: 3.2 ratio (ref 0.0–4.4)
Cholesterol, Total: 259 mg/dL — ABNORMAL HIGH (ref 100–199)
HDL: 81 mg/dL (ref >39)
LDL Chol Calc (NIH): 153 mg/dL — ABNORMAL HIGH (ref 0–99)
Triglycerides: 142 mg/dL (ref 0–149)
VLDL Cholesterol Cal: 25 mg/dL (ref 5–40)

## 2021-08-26 LAB — HEPATIC FUNCTION PANEL
ALT: 11 IU/L (ref 0–32)
AST: 13 IU/L (ref 0–40)
Albumin: 4.3 g/dL (ref 3.8–4.9)
Alkaline Phosphatase: 66 IU/L (ref 44–121)
Bilirubin Total: 0.5 mg/dL (ref 0.0–1.2)
Bilirubin, Direct: 0.13 mg/dL (ref 0.00–0.40)
Total Protein: 7 g/dL (ref 6.0–8.5)

## 2021-08-27 ENCOUNTER — Other Ambulatory Visit (HOSPITAL_COMMUNITY): Payer: Self-pay

## 2021-08-27 ENCOUNTER — Ambulatory Visit (INDEPENDENT_AMBULATORY_CARE_PROVIDER_SITE_OTHER): Payer: BC Managed Care – PPO

## 2021-08-27 DIAGNOSIS — I5022 Chronic systolic (congestive) heart failure: Secondary | ICD-10-CM

## 2021-08-27 DIAGNOSIS — Z95810 Presence of automatic (implantable) cardiac defibrillator: Secondary | ICD-10-CM

## 2021-08-27 NOTE — Progress Notes (Signed)
EPIC Encounter for ICM Monitoring  Patient Name: Alison Howard is a 61 y.o. female Date: 08/27/2021 Primary Care Physican: Johny Blamer, MD Primary Cardiologist: Shirlee Latch Electrophysiologist: Graciela Husbands 08/09/2021 Weight: 225 lbs 08/27/2021 Weight: 224.4 lbs         Spoke with patient and heart failure questions reviewed. She is feeling well today.         Corvue thoracic impedance suggesting fluid levels returned to normal after taking Furosemide every other day.   Prescribed:  Furosemide 20 mg Take 1 tablet (20 mg) by mouth every other day.  Potassium 20 mEq take 1 tablet by mouth every other day.   Labs: 08/14/2021 Creatinine 1.15, BUN 20, Potassium 4.4, Sodium 134, GFR 55 08/08/2021 Creatinine 0.85, BUN 10, Potassium 4.3, Sodium 139, GFR >60 08/07/2021 Creatinine 0.78, BUN 8,   Potassium 4.1, Sodium 134, GFR >60 (3:07 PM) 08/07/2021 Creatinine 0.88, BUN 6,   Potassium 3.1, Sodium 135, GFR >60 (3:50 AM) 08/06/2020 Creatinine 0.94, BUN 6,   Potassium 3.0, Sodium 139, GFR >60  03/27/2021 Creatinine 1.05, BUN 12, Potassium 4.0, Sodium 136, GFR >60 12/25/2020 Creatinine 0.92, BUN 10, Potassium 4.0, Sodium 137, GFR >60 09/19/2020 Creatinine 0.86, BUN 10, Potassium 4.2, Sodium 137, GFR >60 A complete set of results can be found in Results Review.   Recommendations: Advised limit fluid intake to 64 oz and continue Lasix every other day.  Encouraged to call for changes in condition.   Follow-up plan: ICM clinic phone appointment on 09/16/2021.   91 day device clinic remote transmission 10/04/2021.     EP/Cardiology next office visit:  09/27/2021 with [facility] PA/NP.  08/29/2021 with Dr Ladona Ridgel.   Copy of ICM check sent to Dr. Graciela Husbands.    3 month ICM trend: 08/27/2021.    12-14 Month ICM trend:     Karie Soda, RN 08/27/2021 4:02 PM

## 2021-08-29 ENCOUNTER — Other Ambulatory Visit: Payer: Self-pay

## 2021-08-29 ENCOUNTER — Ambulatory Visit (INDEPENDENT_AMBULATORY_CARE_PROVIDER_SITE_OTHER): Payer: BC Managed Care – PPO | Admitting: Internal Medicine

## 2021-08-29 ENCOUNTER — Encounter: Payer: Self-pay | Admitting: Internal Medicine

## 2021-08-29 ENCOUNTER — Telehealth: Payer: Self-pay | Admitting: Pharmacist

## 2021-08-29 VITALS — BP 92/60 | HR 62 | Ht 62.0 in | Wt 225.0 lb

## 2021-08-29 DIAGNOSIS — I472 Ventricular tachycardia, unspecified: Secondary | ICD-10-CM

## 2021-08-29 DIAGNOSIS — I429 Cardiomyopathy, unspecified: Secondary | ICD-10-CM

## 2021-08-29 DIAGNOSIS — I493 Ventricular premature depolarization: Secondary | ICD-10-CM

## 2021-08-29 DIAGNOSIS — Z95810 Presence of automatic (implantable) cardiac defibrillator: Secondary | ICD-10-CM

## 2021-08-29 NOTE — Telephone Encounter (Signed)
Discussed lipid panel results with pt, TG improved but LDL remains elevated at 153. She is currently taking and tolerating pravastatin 80mg  daily.  She's previously intolerant to simvastatin, rosuvastatin, atorvastatin, and ezetimibe. Tried her on Welchol which didn't lower LDL at all. Leqvio and Nexletol are cost prohibitive. Pt refuses self-injections so PCSK9i not an option. She does not qualify for ORION-4 trial.  Revisited PCSK9i with pt as only other potentially affordable add on medication, she is still not interested injections. She is agreeable to continuing on her pravastatin.

## 2021-08-29 NOTE — Patient Instructions (Addendum)
Medication Instructions:  Your physician has recommended you make the following change in your medication:    STOP amiodarone  Labwork: You will get lab work on September 27, 2021 at your appointment with the Heart Failure office.  You do not need to be fasting.  Testing/Procedures: None ordered.  Follow-Up:  SEE INSTRUCTION LETTER  Any Other Special Instructions Will Be Listed Below (If Applicable).  If you need a refill on your cardiac medications before your next appointment, please call your pharmacy.   Cardiac Ablation Cardiac ablation is a procedure to destroy, or ablate, a small amount of heart tissue in very specific places. The heart has many electrical connections. Sometimes these connections are abnormal and can cause the heart to beat very fast or irregularly. Ablating some of the areas that cause problems can improve the heart's rhythm or return it to normal. Ablation may be done for people who: Have Wolff-Parkinson-White syndrome. Have fast heart rhythms (tachycardia). Have taken medicines for an abnormal heart rhythm (arrhythmia) that were not effective or caused side effects. Have a high-risk heartbeat that may be life-threatening. During the procedure, a small incision is made in the neck or the groin, and a long, thin tube (catheter) is inserted into the incision and moved to the heart. Small devices (electrodes) on the tip of the catheter will send out electrical currents. A type of X-ray (fluoroscopy) will be used to help guide the catheter and to provide images of the heart. Tell a health care provider about: Any allergies you have. All medicines you are taking, including vitamins, herbs, eye drops, creams, and over-the-counter medicines. Any problems you or family members have had with anesthetic medicines. Any blood disorders you have. Any surgeries you have had. Any medical conditions you have, such as kidney failure. Whether you are pregnant or may be  pregnant. What are the risks? Generally, this is a safe procedure. However, problems may occur, including: Infection. Bruising and bleeding at the catheter insertion site. Bleeding into the chest, especially into the sac that surrounds the heart. This is a serious complication. Stroke or blood clots. Damage to nearby structures or organs. Allergic reaction to medicines or dyes. Need for a permanent pacemaker if the normal electrical system is damaged. A pacemaker is a small computer that sends electrical signals to the heart and helps your heart beat normally. The procedure not being fully effective. This may not be recognized until months later. Repeat ablation procedures are sometimes done. What happens before the procedure? Medicines Ask your health care provider about: Changing or stopping your regular medicines. This is especially important if you are taking diabetes medicines or blood thinners. Taking medicines such as aspirin and ibuprofen. These medicines can thin your blood. Do not take these medicines unless your health care provider tells you to take them. Taking over-the-counter medicines, vitamins, herbs, and supplements. General instructions Follow instructions from your health care provider about eating or drinking restrictions. Plan to have someone take you home from the hospital or clinic. If you will be going home right after the procedure, plan to have someone with you for 24 hours. Ask your health care provider what steps will be taken to prevent infection. What happens during the procedure?  An IV will be inserted into one of your veins. You will be given a medicine to help you relax (sedative). The skin on your neck or groin will be numbed. An incision will be made in your neck or your groin. A needle will be  inserted through the incision and into a large vein in your neck or groin. A catheter will be inserted into the needle and moved to your heart. Dye may be  injected through the catheter to help your surgeon see the area of the heart that needs treatment. Electrical currents will be sent from the catheter to ablate heart tissue in desired areas. There are three types of energy that may be used to do this: Heat (radiofrequency energy). Laser energy. Extreme cold (cryoablation). When the tissue has been ablated, the catheter will be removed. Pressure will be held on the insertion area to prevent a lot of bleeding. A bandage (dressing) will be placed over the insertion area. The exact procedure may vary among health care providers and hospitals. What happens after the procedure? Your blood pressure, heart rate, breathing rate, and blood oxygen level will be monitored until you leave the hospital or clinic. Your insertion area will be monitored for bleeding. You will need to lie still for a few hours to ensure that you do not bleed from the insertion area. Do not drive for 24 hours or as long as told by your health care provider. Summary Cardiac ablation is a procedure to destroy, or ablate, a small amount of heart tissue using an electrical current. This procedure can improve the heart rhythm or return it to normal. Tell your health care provider about any medical conditions you may have and all medicines you are taking to treat them. This is a safe procedure, but problems may occur. Problems may include infection, bruising, damage to nearby organs or structures, or allergic reactions to medicines. Follow your health care provider's instructions about eating and drinking before the procedure. You may also be told to change or stop some of your medicines. After the procedure, do not drive for 24 hours or as long as told by your health care provider. This information is not intended to replace advice given to you by your health care provider. Make sure you discuss any questions you have with your health care provider. Document Revised: 05/23/2019 Document  Reviewed: 05/23/2019 Elsevier Patient Education  [employer].

## 2021-08-29 NOTE — Progress Notes (Signed)
?  ?  ?  ?HPI ?Alison Howard is referred by Dr. SK for evaluation of VT. She is a pleasant 60 yo woman with a non-ischemic CM and s/p ICD insertion. She has developed worsening VT and symptoms. She presented with sustained VT under the device detection. We do not have a 12 lead unfortunately. She did not have syncope. She has been placed on amiodarone.  ?     ?Allergies  ?Allergen Reactions  ? Ciprofloxacin Hcl Anaphylaxis  ? Codeine Anaphylaxis and Nausea And Vomiting  ? Morphine And Related Anaphylaxis  ? Sulfa Antibiotics Anaphylaxis  ? Zetia [Ezetimibe] Rash  ?    Rash and muscle aches  ? Atorvastatin    ?    Myalgias   ? Dapagliflozin Hives  ? Rosuvastatin    ?    Myalgias  ? Simvastatin    ?    Myalgias   ? Welchol [Colesevelam]    ?    myalgias  ?  ?  ?  ?      ?Current Outpatient Medications  ?Medication Sig Dispense Refill  ? amiodarone (PACERONE) 200 MG tablet Take 2 tablets (400 mg total) by mouth 2 (two) times daily for 14 days, THEN 1 tablet (200 mg total) 2 (two) times daily for 14 days, THEN 1 tablet (200 mg total) daily. 114 tablet 0  ? bisoprolol (ZEBETA) 5 MG tablet Take 1 tablet (5 mg total) by mouth 2 (two) times daily. 180 tablet 3  ? citalopram (CELEXA) 40 MG tablet Take 40 mg by mouth daily.       ? eplerenone (INSPRA) 50 MG tablet TAKE 1 TABLET BY MOUTH EVERY DAY 90 tablet 2  ? furosemide (LASIX) 20 MG tablet Take 1 tablet (20 mg total) by mouth every other day. 90 tablet 3  ? LORazepam (ATIVAN) 0.5 MG tablet Take 0.5 mg by mouth 2 (two) times daily as needed for anxiety.      ? magnesium oxide (MAG-OX) 400 (240 Mg) MG tablet Take 1 tablet (400 mg total) by mouth 2 (two) times daily. 60 tablet 6  ? pantoprazole (PROTONIX) 40 MG tablet Take 40 mg by mouth 2 (two) times daily.       ? potassium chloride SA (KLOR-CON M) 20 MEQ tablet Take 1 tablet (20 mEq total) by mouth every other day. 90 tablet 3  ? pravastatin (PRAVACHOL) 80 MG tablet Take 80 mg by mouth daily.      ? sacubitril-valsartan  (ENTRESTO) 97-103 MG Take 1 tablet by mouth 2 (two) times daily. 180 tablet 3  ?  ?No current facility-administered medications for this visit.  ?  ?  ?  ?    ?Past Medical History:  ?Diagnosis Date  ? CHF (congestive heart failure) (HCC)    ? Chronic systolic heart failure (HCC) 04/25/2013  ? GERD (gastroesophageal reflux disease)    ? Hypercholesteremia    ? LBBB (left bundle branch block)    ? Morbid obesity (HCC) 12/28/2014  ? NICM (nonischemic cardiomyopathy) (HCC)    ?  a. s/p STJ CRTD  ? OSA (obstructive sleep apnea) 12/28/2014  ?  Severe with AHI 27 events per hour on average and the highest AHI was 40 events per hour  ? Ventricular tachycardia    ?  a. s/p appropriate ICD therapy  ?  ?  ?ROS: ?  ? All systems reviewed and negative except as noted in the HPI. ?  ?  ?     ?Past   Surgical History:  ?Procedure Laterality Date  ? BIV ICD GENERATOR CHANGEOUT N/A 02/17/2018  ?  Procedure: BIV ICD GENERATOR CHANGEOUT;  Surgeon: Klein, Steven C, MD;  Location: [facility];  Service: Cardiovascular;  Laterality: N/A;  ? CARDIAC DEFIBRILLATOR PLACEMENT   2012  ?  a. STJ CRTD implanted for NICM, CHF  ? RIGHT/LEFT HEART CATH AND CORONARY ANGIOGRAPHY N/A 10/07/2018  ?  Procedure: RIGHT/LEFT HEART CATH AND CORONARY ANGIOGRAPHY;  Surgeon: McLean, Dalton S, MD;  Location: [facility];  Service: Cardiovascular;  Laterality: N/A;  ? TOTAL ABDOMINAL HYSTERECTOMY      ?  ?  ?  ?     ?Family History  ?Problem Relation Age of Onset  ? Coronary artery disease Mother    ? Heart attack Mother    ? Hyperlipidemia Sister    ? Heart disease Father    ?  ?  ?  ?Social History  ?  ?     ?Socioeconomic History  ? Marital status: Single  ?    Spouse name: Not on file  ? Number of children: 0  ? Years of education: Not on file  ? Highest education level: Not on file  ?Occupational History  ? Not on file  ?Tobacco Use  ? Smoking status: Never  ? Smokeless tobacco: Never  ?Vaping Use  ? Vaping Use: Never used  ?Substance and Sexual  Activity  ? Alcohol use: Yes  ? Drug use: No  ? Sexual activity: Not on file  ?Other Topics Concern  ? Not on file  ?Social History Narrative  ? Not on file  ?  ?Social Determinants of Health  ?  ?Financial Resource Strain: Not on file  ?Food Insecurity: Not on file  ?Transportation Needs: Not on file  ?Physical Activity: Not on file  ?Stress: Not on file  ?Social Connections: Not on file  ?Intimate Partner Violence: Not on file  ?  ?  ?  ?BP 92/60   Pulse 62   Ht 5' 2" (1.575 m)   Wt 225 lb (102.1 kg)   SpO2 97%   BMI 41.15 kg/m?  ?  ?Physical Exam: ?  ?Well appearing NAD ?HEENT: Unremarkable ?Neck:  No JVD, no thyromegally ?Lymphatics:  No adenopathy ?Back:  No CVA tenderness ?Lungs:  Clear with no wheezes ?HEART:  Regular rate rhythm, no murmurs, no rubs, no clicks ?Abd:  soft, positive bowel sounds, no organomegally, no rebound, no guarding ?Ext:  2 plus pulses, no edema, no cyanosis, no clubbing ?Skin:  No rashes no nodules ?Neuro:  CN II through XII intact, motor grossly intact ?  ?EKG - nsr with biv pacing ?  ?DEVICE  ?Normal device function.  See PaceArt for details. No additional VT ?  ?Assess/Plan:  ?Recurrent and sustained VT - she has been suppressed since starting amiodarone. We discussed the treatment options. Her relatively young age makes long term amiodarone a poor choice. We discussed the indications/risks/benefits/goals/expectations of the procedure with the patient and she would like to proceed with ablation.  ?Non-ischemic CM - she appears euvolemic. She will continue her current meds. ?  ?Alison Ottaway,MD ? ?

## 2021-08-30 DIAGNOSIS — Z1231 Encounter for screening mammogram for malignant neoplasm of breast: Secondary | ICD-10-CM | POA: Diagnosis not present

## 2021-08-30 DIAGNOSIS — M8589 Other specified disorders of bone density and structure, multiple sites: Secondary | ICD-10-CM | POA: Diagnosis not present

## 2021-09-04 ENCOUNTER — Telehealth (HOSPITAL_COMMUNITY): Payer: Self-pay | Admitting: Surgery

## 2021-09-04 NOTE — Telephone Encounter (Signed)
Patient reports having an episode of dizziness "near blackout" in which she was unsure of where she was.  She reports her HR was 72 bpm at that time.  Of note she was recently seen by EP and taken off of Amiodarone in preparation for ablation on March 7th.  I advised that I would forward her concerns to provider and to seek emergency care if this happens again.

## 2021-09-06 ENCOUNTER — Other Ambulatory Visit: Payer: Self-pay | Admitting: Internal Medicine

## 2021-09-07 ENCOUNTER — Other Ambulatory Visit: Payer: Self-pay | Admitting: Cardiology

## 2021-09-16 ENCOUNTER — Ambulatory Visit (INDEPENDENT_AMBULATORY_CARE_PROVIDER_SITE_OTHER): Payer: BC Managed Care – PPO

## 2021-09-16 DIAGNOSIS — I5022 Chronic systolic (congestive) heart failure: Secondary | ICD-10-CM | POA: Diagnosis not present

## 2021-09-16 DIAGNOSIS — Z95810 Presence of automatic (implantable) cardiac defibrillator: Secondary | ICD-10-CM

## 2021-09-18 NOTE — Progress Notes (Signed)
EPIC Encounter for ICM Monitoring  Patient Name: Alison Howard is a 61 y.o. female Date: 09/18/2021 Primary Care Physican: Johny Blamer, MD Primary Cardiologist: Shirlee Latch Electrophysiologist: Graciela Husbands 08/09/2021 Weight: 225 lbs 08/27/2021 Weight: 224.4 lbs         Spoke with patient and heart failure questions reviewed. She is feeling well today.    Ablation scheduled for 10/01/2021.       Corvue thoracic impedance close to normal but was suggesting possible fluid accumulation from 2/6-2/15.   Prescribed:  Furosemide 20 mg Take 1 tablet (20 mg) by mouth every other day.  Potassium 20 mEq take 1 tablet by mouth every other day.   Labs: 08/14/2021 Creatinine 1.15, BUN 20, Potassium 4.4, Sodium 134, GFR 55 08/08/2021 Creatinine 0.85, BUN 10, Potassium 4.3, Sodium 139, GFR >60 08/07/2021 Creatinine 0.78, BUN 8,   Potassium 4.1, Sodium 134, GFR >60 (3:07 PM) 08/07/2021 Creatinine 0.88, BUN 6,   Potassium 3.1, Sodium 135, GFR >60 (3:50 AM) 08/06/2020 Creatinine 0.94, BUN 6,   Potassium 3.0, Sodium 139, GFR >60  03/27/2021 Creatinine 1.05, BUN 12, Potassium 4.0, Sodium 136, GFR >60 12/25/2020 Creatinine 0.92, BUN 10, Potassium 4.0, Sodium 137, GFR >60 09/19/2020 Creatinine 0.86, BUN 10, Potassium 4.2, Sodium 137, GFR >60 A complete set of results can be found in Results Review.   Recommendations: No changes and encouraged to call if experiencing any fluid symptoms.   Follow-up plan: ICM clinic phone appointment on 10/09/2021 to recheck fluid levels.   91 day device clinic remote transmission 10/04/2021.     EP/Cardiology next office visit:  09/27/2021 with [facility] PA/NP.  10/31/2021 with Dr Ladona Ridgel (post ablation).   Copy of ICM check sent to Dr. Graciela Husbands.    3 month ICM trend: 09/16/2021.    12-14 Month ICM trend:     Karie Soda, RN 09/18/2021 9:55 AM

## 2021-09-27 ENCOUNTER — Encounter (HOSPITAL_COMMUNITY): Payer: Self-pay

## 2021-09-27 ENCOUNTER — Other Ambulatory Visit: Payer: Self-pay

## 2021-09-27 ENCOUNTER — Ambulatory Visit (HOSPITAL_COMMUNITY)
Admission: RE | Admit: 2021-09-27 | Discharge: 2021-09-27 | Disposition: A | Discharge status: Home / Self Care | Payer: BC Managed Care – PPO | Source: Ambulatory Visit | Attending: Family Medicine | Admitting: Family Medicine

## 2021-09-27 VITALS — BP 86/58 | HR 61 | Wt 229.4 lb

## 2021-09-27 DIAGNOSIS — Z6841 Body Mass Index (BMI) 40.0 and over, adult: Secondary | ICD-10-CM | POA: Insufficient documentation

## 2021-09-27 DIAGNOSIS — I493 Ventricular premature depolarization: Secondary | ICD-10-CM | POA: Insufficient documentation

## 2021-09-27 DIAGNOSIS — I5022 Chronic systolic (congestive) heart failure: Secondary | ICD-10-CM | POA: Insufficient documentation

## 2021-09-27 DIAGNOSIS — Z79899 Other long term (current) drug therapy: Secondary | ICD-10-CM | POA: Insufficient documentation

## 2021-09-27 DIAGNOSIS — I472 Ventricular tachycardia, unspecified: Secondary | ICD-10-CM | POA: Diagnosis not present

## 2021-09-27 DIAGNOSIS — E785 Hyperlipidemia, unspecified: Secondary | ICD-10-CM | POA: Diagnosis not present

## 2021-09-27 DIAGNOSIS — R42 Dizziness and giddiness: Secondary | ICD-10-CM

## 2021-09-27 DIAGNOSIS — Z95810 Presence of automatic (implantable) cardiac defibrillator: Secondary | ICD-10-CM | POA: Insufficient documentation

## 2021-09-27 DIAGNOSIS — E669 Obesity, unspecified: Secondary | ICD-10-CM | POA: Diagnosis not present

## 2021-09-27 DIAGNOSIS — G4733 Obstructive sleep apnea (adult) (pediatric): Secondary | ICD-10-CM | POA: Insufficient documentation

## 2021-09-27 DIAGNOSIS — E782 Mixed hyperlipidemia: Secondary | ICD-10-CM

## 2021-09-27 DIAGNOSIS — I428 Other cardiomyopathies: Secondary | ICD-10-CM | POA: Insufficient documentation

## 2021-09-27 LAB — CBC
HCT: 43.7 % (ref 36.0–46.0)
Hemoglobin: 14.2 g/dL (ref 12.0–15.0)
MCH: 31.5 pg (ref 26.0–34.0)
MCHC: 32.5 g/dL (ref 30.0–36.0)
MCV: 96.9 fL (ref 80.0–100.0)
Platelets: 368 10*3/uL (ref 150–400)
RBC: 4.51 MIL/uL (ref 3.87–5.11)
RDW: 13.6 % (ref 11.5–15.5)
WBC: 9.1 10*3/uL (ref 4.0–10.5)
nRBC: 0 % (ref 0.0–0.2)

## 2021-09-27 LAB — BASIC METABOLIC PANEL
Anion gap: 8 (ref 5–15)
BUN: 11 mg/dL (ref 6–20)
CO2: 28 mmol/L (ref 22–32)
Calcium: 8.9 mg/dL (ref 8.9–10.3)
Chloride: 100 mmol/L (ref 98–111)
Creatinine, Ser: 1.21 mg/dL — ABNORMAL HIGH (ref 0.44–1.00)
GFR, Estimated: 51 mL/min — ABNORMAL LOW (ref >60)
Glucose, Bld: 100 mg/dL — ABNORMAL HIGH (ref 70–99)
Potassium: 4.3 mmol/L (ref 3.5–5.1)
Sodium: 136 mmol/L (ref 135–145)

## 2021-09-27 LAB — BRAIN NATRIURETIC PEPTIDE: B Natriuretic Peptide: 181 pg/mL — ABNORMAL HIGH (ref 0.0–100.0)

## 2021-09-27 MED ORDER — ENTRESTO 24-26 MG PO TABS: 1.0000 | ORAL_TABLET | Freq: Two times a day (BID) | ORAL | 11 refills | Status: DC

## 2021-09-27 NOTE — Progress Notes (Signed)
ID:  Alison Howard, DOB Apr 12, 1961, MRN [id number]   Provider location: [place] [facility] Type of Visit: Established patient  PCP:  Shirline Frees, MD  HF Cardiologist:  Dr. Aundra Dubin   History of Present Illness: Alison Howard is a 61 y.o. female who has a history of CHF and VT and was referred by Dr. Caryl Comes for evaluation of CHF. She has a long history of cardiomyopathy, dating back to at least 2013.  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  She has had VT in the past terminated by [employer].  She has also had up to 22% PVCs in the past, down to 3.1% on 10/19 holter. She is currently on flecainide to suppress the PVCs.  Echo in 12/19 showed severe dilation of the LV with EF 25-30%.    RHC/LHC in 3/20 showed normal filling pressures, CI 2.37, and no significant CAD. CPX in 3/20 showed no significant HF limitation, main problem appeared to be deconditioning.  Echo in 5/22 showed EF 35-40% with basal inferoseptal, basal anteroseptal, and basal inferior akinesis.   She was admitted in 1/23 with CHF and runs of NSVT.  ICD did not discharge. She was diuresed and flecainide was stopped and replaced with amiodarone. No further VT.   Today she returns for HF follow up. Main issue is dizziness x 3 weeks, with standing. She had one episode of near-syncope, HR was 72 at that time. She is SOB with stairs or with a lot of walking. Denies CP, palpitations, abnormal bleeding, edema, or PND/Orthopnea. Appetite ok. No fever or chills. Weight at home 226 pounds. Taking all medications. Recently off amiodarone as she is planning VT ablation with Dr. Lovena Le soon. Watches salt and fluid intake.  St Jude device interrogation: stable thoracic impedence, >99% Bi-V pacing  ReDs: 26%   Labs (10/19): K 4.3, creatinine 0.88 Labs (3/20): K 4.3, creatinine 0.88 Labs (6/20): K 4.4, creatinine 1.02 Labs (11/20): K 4.5 creatinine 0.88, hgb 14.4 Labs (9/21): K 4, creatinine 0.96, LDL 185, HDL 70 Labs (2/22): LDL 173, K  4.2, creatinine 0.86 Labs (1/23): K 4.3, creatinine 0.85, TSH and free T4 elevated  ECG (personally reviewed): none ordered today.    PMH:  1. Chronic systolic CHF: Nonischemic cardiomyopathy. St Jude BiV ICD.  - Echo (8/13): EF 40-45% - Echo (12/15): EF 25-30% - Echo (6/17): EF 35% - Echo (6/18): EF 40-45% - Echo (11/18): EF 35-40% - Echo (12/19): EF 25-30%, severe LV dilation, mild MR.  - RHC/LHC (3/20): no CAD.  Mean RA 4, PA 27/10 mean 16, mean PCWP 5, CI 2.37.  - CPX (3/20): peak VO2 16.9 (97% predicted), VE/VCO2 23, RER 1.14 => no significant HF limitation, primarily limited by deconditioning.  - Cardiac MRI (6/20): Very difficult study due to pacemaker artifact.  EF 34%, delayed enhancement images not interpretable.  - Echo (5/22): EF 35-40% with basal inferoseptal, basal anteroseptal, and basal inferior akinesis.  2. PVCs/VT:  - 6/18 holter 22% PVCs - 10/19 holter 3.1% PVCs 3. OSA 4. Hyperlipidemia: Unable to tolerate statins, Zetia.  5. Obesity  Current Outpatient Medications  Medication Sig Dispense Refill   bisoprolol (ZEBETA) 5 MG tablet Take 1 tablet (5 mg total) by mouth 2 (two) times daily. 180 tablet 3   citalopram (CELEXA) 40 MG tablet Take 40 mg by mouth daily.      eplerenone (INSPRA) 50 MG tablet TAKE 1 TABLET BY MOUTH EVERY DAY 90 tablet 2   furosemide (  LASIX) 20 MG tablet Take 1 tablet (20 mg total) by mouth every other day. 90 tablet 3   LORazepam (ATIVAN) 0.5 MG tablet Take 0.5 mg by mouth 2 (two) times daily as needed for anxiety.     magnesium oxide (MAG-OX) 400 (240 Mg) MG tablet Take 1 tablet (400 mg total) by mouth 2 (two) times daily. 60 tablet 6   pantoprazole (PROTONIX) 40 MG tablet Take 40 mg by mouth 2 (two) times daily.      potassium chloride SA (KLOR-CON M) 20 MEQ tablet Take 1 tablet (20 mEq total) by mouth every other day. 90 tablet 3   pravastatin (PRAVACHOL) 80 MG tablet TAKE UP TO 1 TABLET DAILY AS TOLERATED 90 tablet 1    sacubitril-valsartan (ENTRESTO) 97-103 MG Take 1 tablet by mouth 2 (two) times daily. 180 tablet 3   No current facility-administered medications for this encounter.   Allergies:   Ciprofloxacin hcl, Codeine, Morphine and related, Sulfa antibiotics, Zetia [ezetimibe], Atorvastatin, Dapagliflozin, Rosuvastatin, Simvastatin, and Welchol [colesevelam]   Social History:  The patient  reports that she has never smoked. She has never used smokeless tobacco. She reports current alcohol use. She reports that she does not use drugs.   Family History:  The patient's family history includes Coronary artery disease in her mother; Heart attack in her mother; Heart disease in her father; Hyperlipidemia in her sister.   ROS:  Please see the history of present illness.   All other systems are personally reviewed and negative.   Exam:   BP 90/60    Pulse 66    Wt 104.1 kg (229 lb 6.4 oz)    SpO2 92%    BMI 41.96 kg/m  General:  NAD. No resp difficulty HEENT: Normal Neck: Supple. Thick neck. Carotids 2+ bilat; no bruits. No lymphadenopathy or thryomegaly appreciated. Cor: PMI nondisplaced. Regular rate & rhythm. No rubs, gallops or murmurs. Lungs: Clear Abdomen: Obese, nontender, nondistended. No hepatosplenomegaly. No bruits or masses. Good bowel sounds. Extremities: No cyanosis, clubbing, rash, edema Neuro: Alert & oriented x 3, cranial nerves grossly intact. Moves all 4 extremities w/o difficulty. Affect pleasant.  Recent Labs: 08/06/2021: B Natriuretic Peptide 1,244.4 08/07/2021: Hemoglobin 13.2; Platelets 359; TSH 11.321 08/08/2021: Magnesium 2.1 08/14/2021: BUN 20; Creatinine, Ser 1.15; Potassium 4.4; Sodium 134 08/26/2021: ALT 11  Personally reviewed   Wt Readings from Last 3 Encounters:  09/27/21 104.1 kg (229 lb 6.4 oz)  08/29/21 102.1 kg (225 lb)  08/14/21 101.7 kg (224 lb 3.2 oz)    Orthostatics today (09/27/21): - lying 86/58 HR 61 - sitting 86/60 HR 63 - standing 78/60 HR 73  ASSESSMENT  AND PLAN: 1. Chronic systolic CHF: Long history of nonischemic cardiomyopathy.  Most recent echo in 10/19 with severe LV dilation and EF 25-30%.  Has had frequent PVCs in the past, but most recent holter in 10/19 showed PVC count down to 3.1% on flecainide.  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 RHC/LHC in 3/20 with no significant CAD, normal feeling pressures, and preserved cardiac output.  CPX showed deconditioning but no significant HF limitation. Cardiac MRI in 6/20 was a very difficult study due to pacemaker artifact, LV EF 34% but LGE was not interpretable.  Echo in 5/22 showed EF 35-40% with basal inferoseptal, basal anteroseptal, and basal inferior akinesis.  NYHA class II symptoms, not volume overloaded by exam or Corvue today.  Now with what sound like orthostatic symptoms (sBP only dropped 10 mmHg upon standing), BP on the  soft side but appears this is her baseline. - Decrease Entresto to 24/26 mg bid. BMET/BNP today. - Continue bisoprolol 5 mg bid.  - Continue eplerenone 50 mg daily.  - She was unable to tolerate dapagliflozin due to itching and her insurance does not cover empagliflozin.  - Continue Lasix 20 mg qod and KCl to 10 mEq qod for now. May need to increase to daily with reduction in [place]. 2. PVCs: Count down to 3.1% with holter in 10/19 but EF remained 25-30% by echo. Suspect not primarily a PVC-mediated CMP therefore.  PVCs likely are a consequence of the cardiomyopathy.  She had symptomatic NSVT runs in 1/23 and flecainide was stopped, amiodarone started.  No palpitations.  - Now off amiodarone in preparation for PVC/VT ablation with Dr. Lovena Le next week.  - BMET and CBC today for ablation next week. 3. Obesity: Body mass index is 41.96 kg/m. Continue efforts at diet/exercise for weight loss.  - Will not refer for semaglutide as she does not want to use injectable meds. 4. Hyperlipidemia: Markedly high LDL 153. She will not use any injectable meds.   She is on pravastatin 80 mg  daily.   - Lipid Clinic Pharmacist has discussed further options with her.  - She prefers to stay on her pravastatin, cannot tolerate Zetia. 5. OSA: Unable to tolerate CPAP.  Followup in 4-6 weeks with APP (BP check) and 6 months with Dr. Aundra Dubin.   Signed, Rafael Bihari, FNP  09/27/2021  [facility] [address] [facility] and [place] [address] [phone] (office) [phone] (fax)

## 2021-09-27 NOTE — Progress Notes (Signed)
ReDS Vest / Clip - 09/27/21 0900   ? ?  ? ReDS Vest / Clip  ? Station Marker B   ? Ruler Value 34   ? ReDS Value Range Low volume   ? ReDS Actual Value 26   ? ?  ?  ? ?  ? ? ?

## 2021-09-27 NOTE — Patient Instructions (Addendum)
Labs done today. We will contact you only if your labs are abnormal. ? ?DECREASE Entresto to 24-26mg  (1 tablet) by mouth 2 times daily.  ? ?No other medication changes were made. Please continue all current medications as prescribed. ? ?Your physician recommends that you schedule a follow-up appointment in: 6 weeks with our NP/PA Clinic here in our office and in 6 months with Dr. Shirlee Latch. Please contact our office in August to schedule a September appointment.  ? ?If you have any questions or concerns before your next appointment please send [place] a message through [place] or call our office at [phone].   ? ?TO LEAVE A MESSAGE FOR THE NURSE SELECT OPTION 2, PLEASE LEAVE A MESSAGE INCLUDING: ?YOUR NAME ?DATE OF BIRTH ?CALL BACK NUMBER ?REASON FOR CALL**this is important as we prioritize the call backs ? ?YOU WILL RECEIVE A CALL BACK THE SAME DAY AS LONG AS YOU CALL BEFORE 4:00 PM ? ? ?Do the following things EVERYDAY: ?Weigh yourself in the morning before breakfast. Write it down and keep it in a log. ?Take your medicines as prescribed ?Eat low salt foods--Limit salt (sodium) to 2000 mg per day.  ?Stay as active as you can everyday ?Limit all fluids for the day to less than 2 liters ? ? ?At the [facility], you and your health needs are our priority. As part of our continuing mission to provide you with exceptional heart care, we have created designated Provider Care Teams. These Care Teams include your primary Cardiologist (physician) and Advanced Practice Providers (APPs- Physician Assistants and Nurse Practitioners) who all work together to provide you with the care you need, when you need it.  ? ?You may see any of the following providers on your designated Care Team at your next follow up: ?Dr Arvilla Meres ?Dr Marca Ancona ?Tonye Becket, NP ?Robbie Lis, PA ?Karle Plumber, PharmD ? ? ?Please be sure to bring in all your medications bottles to every appointment.  ? ?

## 2021-09-30 NOTE — Pre-Procedure Instructions (Signed)
Instructed patient on the following items: °Arrival time 0530 °Nothing to eat or drink after midnight °No meds AM of procedure °Responsible person to drive you home and stay with you for 24 hrs ° ° °   °

## 2021-10-01 ENCOUNTER — Encounter (HOSPITAL_COMMUNITY): Payer: Self-pay | Admitting: Internal Medicine

## 2021-10-01 ENCOUNTER — Ambulatory Visit (HOSPITAL_COMMUNITY): Payer: BC Managed Care – PPO | Admitting: Anesthesiology

## 2021-10-01 ENCOUNTER — Ambulatory Visit (HOSPITAL_COMMUNITY)
Admission: RE | Admit: 2021-10-01 | Discharge: 2021-10-02 | Disposition: A | Discharge status: Home / Self Care | Payer: BC Managed Care – PPO | Attending: Internal Medicine | Admitting: Internal Medicine

## 2021-10-01 ENCOUNTER — Encounter (HOSPITAL_COMMUNITY)
Admission: RE | Disposition: A | Discharge status: Home / Self Care | Payer: BC Managed Care – PPO | Source: Home / Self Care | Attending: Internal Medicine

## 2021-10-01 ENCOUNTER — Other Ambulatory Visit: Payer: Self-pay

## 2021-10-01 DIAGNOSIS — E7800 Pure hypercholesterolemia, unspecified: Secondary | ICD-10-CM | POA: Diagnosis not present

## 2021-10-01 DIAGNOSIS — I428 Other cardiomyopathies: Secondary | ICD-10-CM | POA: Insufficient documentation

## 2021-10-01 DIAGNOSIS — I472 Ventricular tachycardia, unspecified: Secondary | ICD-10-CM | POA: Diagnosis not present

## 2021-10-01 DIAGNOSIS — K219 Gastro-esophageal reflux disease without esophagitis: Secondary | ICD-10-CM | POA: Diagnosis not present

## 2021-10-01 DIAGNOSIS — I5022 Chronic systolic (congestive) heart failure: Secondary | ICD-10-CM | POA: Insufficient documentation

## 2021-10-01 DIAGNOSIS — I447 Left bundle-branch block, unspecified: Secondary | ICD-10-CM | POA: Diagnosis not present

## 2021-10-01 DIAGNOSIS — Z6841 Body Mass Index (BMI) 40.0 and over, adult: Secondary | ICD-10-CM | POA: Insufficient documentation

## 2021-10-01 DIAGNOSIS — G4730 Sleep apnea, unspecified: Secondary | ICD-10-CM | POA: Diagnosis not present

## 2021-10-01 DIAGNOSIS — N289 Disorder of kidney and ureter, unspecified: Secondary | ICD-10-CM | POA: Diagnosis not present

## 2021-10-01 DIAGNOSIS — I509 Heart failure, unspecified: Secondary | ICD-10-CM | POA: Diagnosis not present

## 2021-10-01 DIAGNOSIS — G4733 Obstructive sleep apnea (adult) (pediatric): Secondary | ICD-10-CM | POA: Insufficient documentation

## 2021-10-01 HISTORY — PX: V TACH ABLATION: EP1227

## 2021-10-01 LAB — POCT ACTIVATED CLOTTING TIME
Activated Clotting Time: 251 seconds
Activated Clotting Time: 281 seconds
Activated Clotting Time: 263 seconds
Activated Clotting Time: 161 seconds

## 2021-10-01 SURGERY — V TACH ABLATION
Anesthesia: Monitor Anesthesia Care

## 2021-10-01 MED ORDER — HEPARIN (PORCINE) IN NACL 1000-0.9 UT/500ML-% IV SOLN
INTRAVENOUS | Status: AC
  Filled 2021-10-01: qty 500

## 2021-10-01 MED ORDER — BUPIVACAINE HCL (PF) 0.25 % IJ SOLN
INTRAMUSCULAR | Status: DC | PRN
  Administered 2021-10-01: 45 mL

## 2021-10-01 MED ORDER — HEPARIN SODIUM (PORCINE) 1000 UNIT/ML IJ SOLN
INTRAMUSCULAR | Status: DC | PRN
  Administered 2021-10-01 (×2): 3000 [IU] via INTRAVENOUS
  Administered 2021-10-01: 8000 [IU] via INTRAVENOUS

## 2021-10-01 MED ORDER — ONDANSETRON HCL 4 MG/2ML IJ SOLN: 4.0000 mg | Freq: Four times a day (QID) | INTRAMUSCULAR | Status: DC | PRN

## 2021-10-01 MED ORDER — HEPARIN SODIUM (PORCINE) 1000 UNIT/ML IJ SOLN
INTRAMUSCULAR | Status: AC
  Filled 2021-10-01: qty 10

## 2021-10-01 MED ORDER — POTASSIUM CHLORIDE CRYS ER 20 MEQ PO TBCR
20.0000 meq | EXTENDED_RELEASE_TABLET | ORAL | Status: DC
  Administered 2021-10-01: 20 meq via ORAL
  Filled 2021-10-01 (×2): qty 1

## 2021-10-01 MED ORDER — LORAZEPAM 0.5 MG PO TABS: 0.5000 mg | ORAL_TABLET | Freq: Two times a day (BID) | ORAL | Status: DC | PRN

## 2021-10-01 MED ORDER — SODIUM CHLORIDE 0.9% FLUSH
3.0000 mL | Freq: Two times a day (BID) | INTRAVENOUS | Status: DC
  Administered 2021-10-01: 3 mL via INTRAVENOUS

## 2021-10-01 MED ORDER — MAGNESIUM OXIDE -MG SUPPLEMENT 400 (240 MG) MG PO TABS
400.0000 mg | ORAL_TABLET | Freq: Two times a day (BID) | ORAL | Status: DC
  Administered 2021-10-01 – 2021-10-02 (×2): 400 mg via ORAL
  Filled 2021-10-01 (×2): qty 1

## 2021-10-01 MED ORDER — CITALOPRAM HYDROBROMIDE 40 MG PO TABS
40.0000 mg | ORAL_TABLET | Freq: Every day | ORAL | Status: DC
  Administered 2021-10-01 – 2021-10-02 (×2): 40 mg via ORAL
  Filled 2021-10-01 (×2): qty 1

## 2021-10-01 MED ORDER — EPHEDRINE SULFATE-NACL 50-0.9 MG/10ML-% IV SOSY
PREFILLED_SYRINGE | INTRAVENOUS | Status: DC | PRN
  Administered 2021-10-01 (×3): 10 mg via INTRAVENOUS
  Administered 2021-10-01: 5 mg via INTRAVENOUS

## 2021-10-01 MED ORDER — SODIUM CHLORIDE 0.9% FLUSH: 3.0000 mL | INTRAVENOUS | Status: DC | PRN

## 2021-10-01 MED ORDER — SODIUM CHLORIDE 0.9 % IV SOLN: INTRAVENOUS | Status: DC

## 2021-10-01 MED ORDER — ACETAMINOPHEN 325 MG PO TABS
650.0000 mg | ORAL_TABLET | ORAL | Status: DC | PRN
  Administered 2021-10-01: 650 mg via ORAL
  Filled 2021-10-01: qty 2

## 2021-10-01 MED ORDER — BUPIVACAINE HCL (PF) 0.25 % IJ SOLN
INTRAMUSCULAR | Status: AC
  Filled 2021-10-01: qty 60

## 2021-10-01 MED ORDER — MIDAZOLAM HCL 5 MG/5ML IJ SOLN
INTRAMUSCULAR | Status: DC | PRN
  Administered 2021-10-01: 2 mg via INTRAVENOUS

## 2021-10-01 MED ORDER — PROPOFOL 500 MG/50ML IV EMUL
INTRAVENOUS | Status: DC | PRN
  Administered 2021-10-01: 50 ug/kg/min via INTRAVENOUS

## 2021-10-01 MED ORDER — BISOPROLOL FUMARATE 5 MG PO TABS
5.0000 mg | ORAL_TABLET | Freq: Two times a day (BID) | ORAL | Status: DC
  Administered 2021-10-02: 5 mg via ORAL
  Filled 2021-10-01 (×2): qty 1

## 2021-10-01 MED ORDER — PROTAMINE SULFATE 10 MG/ML IV SOLN
30.0000 mg | Freq: Once | INTRAVENOUS | Status: AC
  Administered 2021-10-01: 30 mg via INTRAVENOUS

## 2021-10-01 MED ORDER — HEPARIN (PORCINE) IN NACL 1000-0.9 UT/500ML-% IV SOLN
INTRAVENOUS | Status: DC | PRN
  Administered 2021-10-01 (×2): 500 mL

## 2021-10-01 MED ORDER — SODIUM CHLORIDE 0.9 % IV SOLN: 250.0000 mL | INTRAVENOUS | Status: DC | PRN

## 2021-10-01 MED ORDER — SPIRONOLACTONE 25 MG PO TABS
25.0000 mg | ORAL_TABLET | Freq: Every day | ORAL | Status: DC
Start: 2021-10-02 — End: 2021-10-02
  Administered 2021-10-02: 25 mg via ORAL
  Filled 2021-10-01: qty 1

## 2021-10-01 MED ORDER — PROTAMINE SULFATE 10 MG/ML IV SOLN
50.0000 mg | Freq: Once | INTRAVENOUS | Status: DC
Start: 2021-10-01 — End: 2021-10-01

## 2021-10-01 MED ORDER — PANTOPRAZOLE SODIUM 40 MG PO TBEC
40.0000 mg | DELAYED_RELEASE_TABLET | Freq: Two times a day (BID) | ORAL | Status: DC
  Administered 2021-10-01 – 2021-10-02 (×2): 40 mg via ORAL
  Filled 2021-10-01 (×2): qty 1

## 2021-10-01 MED ORDER — LIDOCAINE 2% (20 MG/ML) 5 ML SYRINGE
INTRAMUSCULAR | Status: DC | PRN
  Administered 2021-10-01: 40 mg via INTRAVENOUS
  Administered 2021-10-01: 20 mg via INTRAVENOUS

## 2021-10-01 MED ORDER — FENTANYL CITRATE (PF) 100 MCG/2ML IJ SOLN
INTRAMUSCULAR | Status: DC | PRN
  Administered 2021-10-01 (×2): 50 ug via INTRAVENOUS

## 2021-10-01 MED ORDER — HEPARIN SODIUM (PORCINE) 1000 UNIT/ML IJ SOLN
INTRAMUSCULAR | Status: DC | PRN
  Administered 2021-10-01: 1000 [IU] via INTRAVENOUS

## 2021-10-01 MED ORDER — FUROSEMIDE 20 MG PO TABS
20.0000 mg | ORAL_TABLET | ORAL | Status: DC
  Administered 2021-10-01: 20 mg via ORAL
  Filled 2021-10-01: qty 1

## 2021-10-01 MED ORDER — PROTAMINE SULFATE 10 MG/ML IV SOLN
INTRAVENOUS | Status: AC
  Filled 2021-10-01: qty 5

## 2021-10-01 SURGICAL SUPPLY — 12 items
CATH JOSEPH QUAD ALLRED 6F REP (CATHETERS) ×2 IMPLANT
CATH SMTCH THERMOCOOL SF FJ (CATHETERS) ×2 IMPLANT
CATH WEBSTER BI DIR CS D-F CRV (CATHETERS) ×2 IMPLANT
MAT PREVALON FULL STRYKER (MISCELLANEOUS) ×2 IMPLANT
PACK EP LATEX FREE (CUSTOM PROCEDURE TRAY) ×3
PACK EP LF (CUSTOM PROCEDURE TRAY) ×1 IMPLANT
PAD DEFIB RADIO PHYSIO CONN (PAD) ×3 IMPLANT
PATCH CARTO3 (PAD) ×2 IMPLANT
SHEATH PINNACLE 6F 10CM (SHEATH) ×2 IMPLANT
SHEATH PINNACLE 7F 10CM (SHEATH) ×2 IMPLANT
SHEATH PINNACLE 8F 10CM (SHEATH) ×4 IMPLANT
TUBING SMART ABLATE COOLFLOW (TUBING) ×2 IMPLANT

## 2021-10-01 NOTE — Progress Notes (Signed)
Spoke with Dr. Ladona Ridgel regarding pt's decreased BP, see flowsheets, ok to transfer pt to floor as long as SBP is at 75 or above, he is aware pt's BP runs lower ?

## 2021-10-01 NOTE — TOC Progression Note (Addendum)
Transition of Care (TOC) - Progression Note  ? ? ?Patient Details  ?Name: Alison Howard ?MRN: [id number] ?Date of Birth: [birth year] ? ?Transition of Care (TOC) CM/SW Contact  ?Zenon Mayo, RN ?Phone Number: ?10/01/2021, 4:29 PM ? ?Clinical Narrative:    ? ?Transition of Care (TOC) Screening Note ? ? ?Patient Details  ?Name: Alison Howard ?Date of Birth: [birth year] ? ? ?Transition of Care (TOC) CM/SW Contact:    ?Zenon Mayo, RN ?Phone Number: ?10/01/2021, 4:29 PM ? ? ? ?Transition of Care Department [facility]) has reviewed patient and no TOC needs have been identified at this time.  Presents with Recurrent and sustained VT .  TOC  will continue to monitor patient advancement through interdisciplinary progression rounds. If new patient transition needs arise, please place a TOC consult. ?  ? ? ?  ?  ? ?Expected Discharge Plan and Services ?  ?  ?  ?  ?  ?                ?  ?  ?  ?  ?  ?  ?  ?  ?  ?  ? ? ?Social Determinants of Health (SDOH) Interventions ?  ? ?Readmission Risk Interventions ?No flowsheet data found. ? ?

## 2021-10-01 NOTE — Progress Notes (Signed)
6,7,8 FR Venous Sheaths removed from Rt Femoral Vein. Manual Pressure held for 15 minutes. 62F Arterial Sheath then removed from Rt Femoral Artery. Manual Pressure held for a total of 35 minutes. Hemostasis achieved. VSS. Rt groin soft with no active bleeding or hematoma noted. 4x4 and tegaderm applied to site. Post Procedure bleeding precautions reviewed with patient. Stacy,RN assessed groin and report given back to [place]. Patient resting comfortably at this time. Will continue to monitor patient.  ?

## 2021-10-01 NOTE — Transfer of Care (Signed)
Immediate Anesthesia Transfer of Care Note ? ?Patient: Alison Howard ? ?Procedure(s) Performed: V TACH ABLATION ? ?Patient Location: Cath Lab ? ?Anesthesia Type:MAC ? ?Level of Consciousness: awake and patient cooperative ? ?Airway & Oxygen Therapy: Patient Spontanous Breathing and Patient connected to nasal cannula oxygen ? ?Post-op Assessment: Report given to RN, Post -op Vital signs reviewed and stable and Patient moving all extremities ? ?Post vital signs: Reviewed and stable ? ?Last Vitals:  ?Vitals Value Taken Time  ?BP 67/41 10/01/21 1045  ?Temp    ?Pulse 88 10/01/21 1044  ?Resp 20 10/01/21 1048  ?SpO2 99 % 10/01/21 1044  ?Vitals shown include unvalidated device data. ? ?Last Pain:  ?Vitals:  ? 10/01/21 0623  ?TempSrc:   ?PainSc: 0-No pain  ?   ? ?  ? ?Complications: No notable events documented. ?

## 2021-10-01 NOTE — Anesthesia Procedure Notes (Signed)
Procedure Name: [place] ?Date/Time: 10/01/2021 7:43 AM ?Performed by: Moshe Salisbury, CRNA ?Pre-anesthesia Checklist: Patient identified, Emergency Drugs available, Suction available and Patient being monitored ?Patient Re-evaluated:Patient Re-evaluated prior to induction ?Oxygen Delivery Method: Nasal cannula ?Placement Confirmation: positive ETCO2 ?Dental Injury: Teeth and Oropharynx as per pre-operative assessment  ? ? ? ? ?

## 2021-10-01 NOTE — H&P (Signed)
?  ?  ?  ?HPI ?[name] is referred by Dr. Renaldo Reel for evaluation of VT. She is a pleasant 61 yo woman with a non-ischemic CM and s/p ICD insertion. She has developed worsening VT and symptoms. She presented with sustained VT under the device detection. We do not have a 12 lead unfortunately. She did not have syncope. She has been placed on amiodarone.  ?     ?Allergies  ?Allergen Reactions  ? Ciprofloxacin Hcl Anaphylaxis  ? Codeine Anaphylaxis and Nausea And Vomiting  ? Morphine And Related Anaphylaxis  ? Sulfa Antibiotics Anaphylaxis  ? Zetia [Ezetimibe] Rash  ?    Rash and muscle aches  ? Atorvastatin    ?    Myalgias   ? Dapagliflozin Hives  ? Rosuvastatin    ?    Myalgias  ? Simvastatin    ?    Myalgias   ? Welchol [Colesevelam]    ?    myalgias  ?  ?  ?  ?      ?Current Outpatient Medications  ?Medication Sig Dispense Refill  ? amiodarone (PACERONE) 200 MG tablet Take 2 tablets (400 mg total) by mouth 2 (two) times daily for 14 days, THEN 1 tablet (200 mg total) 2 (two) times daily for 14 days, THEN 1 tablet (200 mg total) daily. 114 tablet 0  ? bisoprolol (ZEBETA) 5 MG tablet Take 1 tablet (5 mg total) by mouth 2 (two) times daily. 180 tablet 3  ? citalopram (CELEXA) 40 MG tablet Take 40 mg by mouth daily.       ? eplerenone (INSPRA) 50 MG tablet TAKE 1 TABLET BY MOUTH EVERY DAY 90 tablet 2  ? furosemide (LASIX) 20 MG tablet Take 1 tablet (20 mg total) by mouth every other day. 90 tablet 3  ? LORazepam (ATIVAN) 0.5 MG tablet Take 0.5 mg by mouth 2 (two) times daily as needed for anxiety.      ? magnesium oxide (MAG-OX) 400 (240 Mg) MG tablet Take 1 tablet (400 mg total) by mouth 2 (two) times daily. 60 tablet 6  ? pantoprazole (PROTONIX) 40 MG tablet Take 40 mg by mouth 2 (two) times daily.       ? potassium chloride SA (KLOR-CON M) 20 MEQ tablet Take 1 tablet (20 mEq total) by mouth every other day. 90 tablet 3  ? pravastatin (PRAVACHOL) 80 MG tablet Take 80 mg by mouth daily.      ? sacubitril-valsartan  (ENTRESTO) 97-103 MG Take 1 tablet by mouth 2 (two) times daily. 180 tablet 3  ?  ?No current facility-administered medications for this visit.  ?  ?  ?  ?    ?Past Medical History:  ?Diagnosis Date  ? CHF (congestive heart failure) ([place])    ? Chronic systolic heart failure ([place]) 04/25/2013  ? GERD (gastroesophageal reflux disease)    ? Hypercholesteremia    ? LBBB (left bundle branch block)    ? Morbid obesity ([place]) 12/28/2014  ? NICM (nonischemic cardiomyopathy) ([place])    ?  a. s/p STJ CRTD  ? OSA (obstructive sleep apnea) 12/28/2014  ?  Severe with AHI 27 events per hour on average and the highest AHI was 40 events per hour  ? Ventricular tachycardia    ?  a. s/p appropriate ICD therapy  ?  ?  ?ROS: ?  ? All systems reviewed and negative except as noted in the HPI. ?  ?  ?     ?Past  Surgical History:  ?Procedure Laterality Date  ? BIV ICD GENERATOR CHANGEOUT N/A 02/17/2018  ?  Procedure: BIV ICD GENERATOR CHANGEOUT;  Surgeon: Deboraha Sprang, MD;  Location: [facility];  Service: Cardiovascular;  Laterality: N/A;  ? CARDIAC DEFIBRILLATOR PLACEMENT   2012  ?  a. STJ CRTD implanted for NICM, CHF  ? RIGHT/LEFT HEART CATH AND CORONARY ANGIOGRAPHY N/A 10/07/2018  ?  Procedure: RIGHT/LEFT HEART CATH AND CORONARY ANGIOGRAPHY;  Surgeon: Larey Dresser, MD;  Location: [facility];  Service: Cardiovascular;  Laterality: N/A;  ? TOTAL ABDOMINAL HYSTERECTOMY      ?  ?  ?  ?     ?Family History  ?Problem Relation Age of Onset  ? Coronary artery disease Mother    ? Heart attack Mother    ? Hyperlipidemia Sister    ? Heart disease Father    ?  ?  ?  ?Social History  ?  ?     ?Socioeconomic History  ? Marital status: Single  ?    Spouse name: Not on file  ? Number of children: 0  ? Years of education: Not on file  ? Highest education level: Not on file  ?Occupational History  ? Not on file  ?Tobacco Use  ? Smoking status: Never  ? Smokeless tobacco: Never  ?Vaping Use  ? Vaping Use: Never used  ?Substance and Sexual  Activity  ? Alcohol use: Yes  ? Drug use: No  ? Sexual activity: Not on file  ?Other Topics Concern  ? Not on file  ?Social History Narrative  ? Not on file  ?  ?Social Determinants of Health  ?  ?Financial Resource Strain: Not on file  ?Food Insecurity: Not on file  ?Transportation Needs: Not on file  ?Physical Activity: Not on file  ?Stress: Not on file  ?Social Connections: Not on file  ?Intimate Partner Violence: Not on file  ?  ?  ?  ?BP 92/60   Pulse 62   Ht 5\' 2"  (1.575 m)   Wt 225 lb (102.1 kg)   SpO2 97%   BMI 41.15 kg/m?  ?  ?Physical Exam: ?  ?Well appearing NAD ?HEENT: Unremarkable ?Neck:  No JVD, no thyromegally ?Lymphatics:  No adenopathy ?Back:  No CVA tenderness ?Lungs:  Clear with no wheezes ?HEART:  Regular rate rhythm, no murmurs, no rubs, no clicks ?Abd:  soft, positive bowel sounds, no organomegally, no rebound, no guarding ?Ext:  2 plus pulses, no edema, no cyanosis, no clubbing ?Skin:  No rashes no nodules ?Neuro:  CN II through XII intact, motor grossly intact ?  ?EKG - nsr with biv pacing ?  ?DEVICE  ?Normal device function.  See PaceArt for details. No additional VT ?  ?Assess/Plan:  ?Recurrent and sustained VT - she has been suppressed since starting amiodarone. We discussed the treatment options. Her relatively young age makes long term amiodarone a poor choice. We discussed the indications/risks/benefits/goals/expectations of the procedure with the patient and she would like to proceed with ablation.  ?Non-ischemic CM - she appears euvolemic. She will continue her current meds. ?  ?Alison Howard ? ?

## 2021-10-01 NOTE — Anesthesia Postprocedure Evaluation (Signed)
Anesthesia Post Note ? ?Patient: Alison Howard ? ?Procedure(s) Performed: V TACH ABLATION ? ?  ? ?Patient location during evaluation: Cath Lab ?Anesthesia Type: MAC ?Level of consciousness: awake ?Pain management: pain level controlled ?Vital Signs Assessment: post-procedure vital signs reviewed and stable ?Respiratory status: spontaneous breathing, nonlabored ventilation, respiratory function stable and patient connected to nasal cannula oxygen ?Cardiovascular status: stable and blood pressure returned to baseline ?Postop Assessment: no apparent nausea or vomiting ?Anesthetic complications: no ? ? ?No notable events documented. ? ?Last Vitals:  ?Vitals:  ? 10/01/21 1646 10/01/21 2019  ?BP: 108/67 (!) 92/55  ?Pulse: 68 70  ?Resp: 14 15  ?Temp: 36.7 ?C 37.1 ?C  ?SpO2: 99% 94%  ?  ?Last Pain:  ?Vitals:  ? 10/01/21 2110  ?TempSrc:   ?PainSc: 4   ? ? ?  ?  ?  ?  ?  ?  ? ?Mehar Kirkwood P Graelyn Bihl ? ? ? ? ?

## 2021-10-01 NOTE — Anesthesia Preprocedure Evaluation (Addendum)
Anesthesia Evaluation  ?Patient identified by MRN, date of birth, ID band ?Patient awake ? ? ? ?Reviewed: ?Allergy & Precautions, NPO status , Patient's Chart, lab work & pertinent test results ? ?Airway ?Mallampati: II ? ?TM Distance: >3 FB ?Neck ROM: Full ? ? ? Dental ?no notable dental hx. ? ?  ?Pulmonary ?sleep apnea ,  ?  ?Pulmonary exam normal ?breath sounds clear to auscultation ? ? ? ? ? ? Cardiovascular ?+CHF  ?Normal cardiovascular exam+ dysrhythmias + Cardiac Defibrillator ? ?Rhythm:Regular Rate:Normal ? ?ECHO: ?Left ventricular ejection fraction, by estimation, is 35 to 40% ?  ?Neuro/Psych ?negative neurological ROS ? negative psych ROS  ? GI/Hepatic ?Neg liver ROS, GERD  Medicated and Controlled,  ?Endo/Other  ?Morbid obesity ? Renal/GU ?Renal disease  ? ?  ?Musculoskeletal ?negative musculoskeletal ROS ?(+)  ? Abdominal ?  ?Peds ? Hematology ?negative hematology ROS ?(+)   ?Anesthesia Other Findings ?Ventricular Tachycardia ? Reproductive/Obstetrics ? ?  ? ? ? ? ? ? ? ? ? ? ? ? ? ?  ?  ? ? ? ? ? ? ? ?Anesthesia Physical ?Anesthesia Plan ? ?ASA: 3 ? ?Anesthesia Plan: MAC  ? ?Post-op Pain Management:   ? ?Induction: Intravenous ? ?PONV Risk Score and Plan: 2 and Ondansetron, Dexamethasone, Midazolam, Propofol infusion and Treatment may vary due to age or medical condition ? ?Airway Management Planned: Simple Face Mask ? ?Additional Equipment:  ? ?Intra-op Plan:  ? ?Post-operative Plan:  ? ?Informed Consent: I have reviewed the patients History and Physical, chart, labs and discussed the procedure including the risks, benefits and alternatives for the proposed anesthesia with the patient or authorized representative who has indicated his/her understanding and acceptance.  ? ? ? ?Dental advisory given ? ?Plan Discussed with: CRNA ? ?Anesthesia Plan Comments:   ? ? ? ? ? ? ?Anesthesia Quick Evaluation ? ?

## 2021-10-02 ENCOUNTER — Encounter (HOSPITAL_COMMUNITY): Payer: Self-pay | Admitting: Internal Medicine

## 2021-10-02 DIAGNOSIS — K219 Gastro-esophageal reflux disease without esophagitis: Secondary | ICD-10-CM | POA: Diagnosis not present

## 2021-10-02 DIAGNOSIS — I447 Left bundle-branch block, unspecified: Secondary | ICD-10-CM | POA: Diagnosis not present

## 2021-10-02 DIAGNOSIS — I472 Ventricular tachycardia, unspecified: Secondary | ICD-10-CM | POA: Diagnosis not present

## 2021-10-02 DIAGNOSIS — E7800 Pure hypercholesterolemia, unspecified: Secondary | ICD-10-CM | POA: Diagnosis not present

## 2021-10-02 DIAGNOSIS — Z6841 Body Mass Index (BMI) 40.0 and over, adult: Secondary | ICD-10-CM | POA: Diagnosis not present

## 2021-10-02 DIAGNOSIS — I5022 Chronic systolic (congestive) heart failure: Secondary | ICD-10-CM | POA: Diagnosis not present

## 2021-10-02 DIAGNOSIS — I428 Other cardiomyopathies: Secondary | ICD-10-CM | POA: Diagnosis not present

## 2021-10-02 DIAGNOSIS — G4733 Obstructive sleep apnea (adult) (pediatric): Secondary | ICD-10-CM | POA: Diagnosis not present

## 2021-10-02 NOTE — Discharge Instructions (Signed)

## 2021-10-02 NOTE — Discharge Summary (Addendum)
? ? ?ELECTROPHYSIOLOGY PROCEDURE DISCHARGE SUMMARY  ? ? ?Patient ID: Alison Howard,  ?MRN: [id number], DOB/AGE: 1961-05-26 61 y.o. ? ?Admit date: 10/01/2021 ?Discharge date: 10/02/2021 ? ?Primary Care Physician: Shirline Frees, MD  ?Primary Cardiologist: None  ?Electrophysiologist: Dr. Caryl Comes ? ?Primary Discharge Diagnosis:  ?Ventricular Tachycardia ? ?Secondary Discharge Diagnosis:  ?NICM ? ?Procedures This Admission:  ?1.  Electrophysiology study and radiofrequency catheter ablation of Ventricular Tachycardia on 10/01/2021 by Dr. Lovena Le.   ?This study demonstrated: Inducible sustained monomorphic VT with unsuccessful catheter ablation. The ablation catheter had a 93% pace map match but did not terminate the VT, raising the question of an epicardial location.  ? ?Brief HPI: ?Alison Howard is a 61 y.o. female with a history of  Ventricular Tachycardia.  Felt to benefit from ablation given her young age. Risks, benefits, and alternatives to catheter ablation of Ventricular Tachycardia were reviewed with the patient who wished to proceed.  ? ?Hospital Course:  ?The patient was admitted and underwent EPS/RFCA of Ventricular Tachycardia with details as outlined above.  They were monitored on telemetry overnight which demonstrated occasional PVCs but no sustained VT.  Groin was without complication on the day of discharge.  The patient was examined and considered to be stable for discharge.  Wound care and restrictions were reviewed with the patient.  The patient will be seen back by Dr. Lovena Le in 4 weeks  ? ?Dr. Caryl Comes reviewed the plan with the patient. With no change in symptoms on either flecainide or amiodarone, will leave off for now. Plan to reach out to Dr. Velna Hatchet to see if candidate for ablation attempt at [facility].  ? ?Physical Exam: ?Vitals:  ? 10/02/21 0435 10/02/21 0721 10/02/21 0800 10/02/21 1054  ?BP: 102/74 102/80 104/65 132/65  ?Pulse: 65 68 69 64  ?Resp: 18 (!) 21 15 15   ?Temp: 98 ?F (36.7 ?C) 98 ?F (36.7 ?C)     ?TempSrc: Oral Oral    ?SpO2: 98% 98% 97% 97%  ?Weight: 103.3 kg     ?Height:      ? ? ?GEN- The patient is well appearing, alert and oriented x 3 today.   ?HEENT: normocephalic, atraumatic; sclera clear, conjunctiva pink; hearing intact; oropharynx clear; neck supple  ?Lungs- Clear to ausculation bilaterally, normal work of breathing.  No wheezes, rales, rhonchi ?Heart- Regular rate and rhythm, no murmurs, rubs or gallops  ?GI- soft, non-tender, non-distended, bowel sounds present  ?Extremities- no clubbing, cyanosis, or edema; DP/PT/radial pulses 2+ bilaterally, groin without hematoma/bruit ?MS- no significant deformity or atrophy ?Skin- warm and dry, no rash or lesion ?Psych- euthymic mood, full affect ?Neuro- strength and sensation are intact ? ? ?Labs: ?  ?Lab Results  ?Component Value Date  ? WBC 9.1 09/27/2021  ? HGB 14.2 09/27/2021  ? HCT 43.7 09/27/2021  ? MCV 96.9 09/27/2021  ? PLT 368 09/27/2021  ?  ?Recent Labs  ?Lab 09/27/21 ?1032  ?NA 136  ?K 4.3  ?CL 100  ?CO2 28  ?BUN 11  ?CREATININE 1.21*  ?CALCIUM 8.9  ?GLUCOSE 100*  ? ? ? ?Discharge Medications:  ?Allergies as of 10/02/2021   ? ?   Reactions  ? Ciprofloxacin Hcl Anaphylaxis  ? Codeine Anaphylaxis, Nausea And Vomiting  ? Morphine And Related Anaphylaxis  ? Sulfa Antibiotics Anaphylaxis  ? Zetia [ezetimibe] Rash  ? Rash and muscle aches  ? Atorvastatin   ? Myalgias   ? Dapagliflozin Hives  ? Rosuvastatin   ? Myalgias  ? Simvastatin   ? Myalgias   ?  Welchol [colesevelam]   ? myalgias  ? ?  ? ?  ?Medication List  ?  ? ?TAKE these medications   ? ?bisoprolol 5 MG tablet ?Commonly known as: ZEBETA ?Take 1 tablet (5 mg total) by mouth 2 (two) times daily. ?  ?citalopram 40 MG tablet ?Commonly known as: CELEXA ?Take 40 mg by mouth daily. ?  ?Entresto 24-26 MG ?Generic drug: sacubitril-valsartan ?Take 1 tablet by mouth 2 (two) times daily. ?  ?eplerenone 50 MG tablet ?Commonly known as: INSPRA ?TAKE 1 TABLET BY MOUTH EVERY DAY ?  ?furosemide 20 MG  tablet ?Commonly known as: LASIX ?Take 1 tablet (20 mg total) by mouth every other day. ?  ?LORazepam 0.5 MG tablet ?Commonly known as: ATIVAN ?Take 0.5 mg by mouth 2 (two) times daily as needed for anxiety. ?  ?magnesium oxide 400 (240 Mg) MG tablet ?Commonly known as: MAG-OX ?Take 1 tablet (400 mg total) by mouth 2 (two) times daily. ?  ?pantoprazole 40 MG tablet ?Commonly known as: PROTONIX ?Take 40 mg by mouth 2 (two) times daily. ?  ?potassium chloride SA 20 MEQ tablet ?Commonly known as: KLOR-CON M ?Take 1 tablet (20 mEq total) by mouth every other day. ?  ?pravastatin 80 MG tablet ?Commonly known as: PRAVACHOL ?TAKE UP TO 1 TABLET DAILY AS TOLERATED ?  ? ?  ? ? ?Disposition:  ? ? Follow-up Information   ? ? Shirline Frees, MD Follow up.   ?Specialty: Family Medicine ?Contact information: ?[address] ?[address] ?[place] [address] ?[phone] ? ? ?  ?  ? ? Larey Dresser, MD .   ?Specialty: Cardiology ?Contact information: ?[address] ?[place] [address] ?[phone] ? ? ?  ?  ? ? Evans Lance, MD Follow up.   ?Specialty: Cardiology ?Why: on 4/6 at 215 for post ablation follow up ?Contact information: ?[address] ?[address] ?[place] [address] ?[phone] ? ? ?  ?  ? ?  ?  ? ?  ? ? ?Duration of Discharge Encounter: Greater than 30 minutes including physician time. ? ?Signed, ?Shirley Friar, PA-C  ?10/02/2021 ?11:47 AM ? ?I reached out to Dr Noralee Stain in [place] about potentially seeing her in consultation for repeat ablation attempt.  For now we will hold off on antiarrhythmic therapy as she was not particularly impressed that if the flecainide or amiodarone helped. ? ?

## 2021-10-02 NOTE — Plan of Care (Signed)

## 2021-10-02 NOTE — TOC Transition Note (Signed)
Transition of Care (TOC) - CM/SW Discharge Note ? ? ?Patient Details  ?Name: Alison Howard ?MRN: [id number] ?Date of Birth: [birth year] ? ?Transition of Care (TOC) CM/SW Contact:  ?Leone Haven, RN ?Phone Number: ?10/02/2021, 10:48 AM ? ? ?Clinical Narrative:    ?Patient for dc today, her sister is at bedside to transport her home.   She has no needs.  ? ? ?  ?  ? ? ?Patient Goals and CMS Choice ?  ?  ?  ? ?Discharge Placement ?  ?           ?  ?  ?  ?  ? ?Discharge Plan and Services ?  ?  ?           ?  ?  ?  ?  ?  ?  ?  ?  ?  ?  ? ?Social Determinants of Health (SDOH) Interventions ?  ? ? ?Readmission Risk Interventions ?No flowsheet data found. ? ? ? ? ?

## 2021-10-04 ENCOUNTER — Ambulatory Visit (INDEPENDENT_AMBULATORY_CARE_PROVIDER_SITE_OTHER): Payer: BC Managed Care – PPO

## 2021-10-04 DIAGNOSIS — I429 Cardiomyopathy, unspecified: Secondary | ICD-10-CM

## 2021-10-04 DIAGNOSIS — I5022 Chronic systolic (congestive) heart failure: Secondary | ICD-10-CM | POA: Diagnosis not present

## 2021-10-04 LAB — CUP PACEART REMOTE DEVICE CHECK
Battery Remaining Longevity: 50 mo
Battery Remaining Percentage: 55 %
Battery Voltage: 2.95 V
Brady Statistic AP VP Percent: 29 %
Brady Statistic AP VS Percent: 1 %
Brady Statistic AS VP Percent: 69 %
Brady Statistic AS VS Percent: 1 %
Brady Statistic RA Percent Paced: 28 %
Date Time Interrogation Session: 20230310020021
HighPow Impedance: 62 Ohm
HighPow Impedance: 62 Ohm
Implantable Lead Implant Date: 20120529
Implantable Lead Implant Date: 20120529
Implantable Lead Implant Date: 20120529
Implantable Lead Location: 753858
Implantable Lead Location: 753859
Implantable Lead Location: 753860
Implantable Pulse Generator Implant Date: 20190724
Lead Channel Impedance Value: 430 Ohm
Lead Channel Impedance Value: 510 Ohm
Lead Channel Impedance Value: 980 Ohm
Lead Channel Pacing Threshold Amplitude: 1 V
Lead Channel Pacing Threshold Amplitude: 1.25 V
Lead Channel Pacing Threshold Amplitude: 2 V
Lead Channel Pacing Threshold Pulse Width: 0.5 ms
Lead Channel Pacing Threshold Pulse Width: 0.5 ms
Lead Channel Pacing Threshold Pulse Width: 0.5 ms
Lead Channel Sensing Intrinsic Amplitude: 12 mV
Lead Channel Sensing Intrinsic Amplitude: 2.9 mV
Lead Channel Setting Pacing Amplitude: 2 V
Lead Channel Setting Pacing Amplitude: 2.25 V
Lead Channel Setting Pacing Amplitude: 2.5 V
Lead Channel Setting Pacing Pulse Width: 0.5 ms
Lead Channel Setting Pacing Pulse Width: 0.5 ms
Lead Channel Setting Sensing Sensitivity: 0.5 mV
Pulse Gen Serial Number: 9842506

## 2021-10-09 ENCOUNTER — Ambulatory Visit (INDEPENDENT_AMBULATORY_CARE_PROVIDER_SITE_OTHER): Payer: BC Managed Care – PPO

## 2021-10-09 DIAGNOSIS — Z95810 Presence of automatic (implantable) cardiac defibrillator: Secondary | ICD-10-CM

## 2021-10-09 DIAGNOSIS — I5022 Chronic systolic (congestive) heart failure: Secondary | ICD-10-CM

## 2021-10-09 NOTE — Progress Notes (Signed)
EPIC Encounter for ICM Monitoring ? ?Patient Name: Alison Howard is a 61 y.o. female ?Date: 10/09/2021 ?Primary Care Physican: Shirline Frees, MD ?Primary Cardiologist: Aundra Dubin ?Electrophysiologist: Caryl Comes ?08/09/2021 Weight: 225 lbs ?08/27/2021 Weight: 224.4 lbs ?10/09/2021 Weight: 225 lbs ?  ?      Spoke with patient and heart failure questions reviewed. She is feeling well today.    Attempted VT Ablation 10/01/2021.     ?  ?[facility] thoracic impedance normal but was suggesting possible fluid accumulation from 2/27-3/13.  ReDs 26% at 3/3 HF clinic OV. ?  ?Prescribed:  ?Furosemide 20 mg Take 1 tablet (20 mg) by mouth every other day.  ?Potassium 20 mEq take 1 tablet by mouth every other day. ?  ?Labs: ?09/27/2021 Creatinine 1.21, BUN 11, Potassium 4.3, Sodium 136, GFR 51 ?08/14/2021 Creatinine 1.15, BUN 20, Potassium 4.4, Sodium 134, GFR 55 ?08/08/2021 Creatinine 0.85, BUN 10, Potassium 4.3, Sodium 139, GFR >60 ?08/07/2021 Creatinine 0.78, BUN 8,   Potassium 4.1, Sodium 134, GFR >60 (3:07 PM) ?A complete set of results can be found in Results Review. ?  ?Recommendations:  No changes and encouraged to call if experiencing any fluid symptoms. ?  ?Follow-up plan: ICM clinic phone appointment on 10/23/2021.   91 day device clinic remote transmission 01/03/2022.   ?  ?EP/Cardiology next office visit:  11/06/2021 with [facility]/NP.  10/31/2021 with Dr Lovena Le (post ablation). ?  ?Copy of ICM check sent to Dr. Caryl Comes.   ? ?3 month ICM trend: 10/09/2021. ? ? ? ?12-14 Month ICM trend:  ? ? ? ?[name], RN ?10/09/2021 ?10:56 AM ? ?

## 2021-10-09 NOTE — Progress Notes (Signed)
Remote ICD transmission.   

## 2021-10-14 ENCOUNTER — Other Ambulatory Visit (HOSPITAL_COMMUNITY): Payer: Self-pay | Admitting: Cardiology

## 2021-10-22 ENCOUNTER — Encounter (HOSPITAL_COMMUNITY): Payer: Self-pay | Admitting: Internal Medicine

## 2021-10-23 ENCOUNTER — Ambulatory Visit (INDEPENDENT_AMBULATORY_CARE_PROVIDER_SITE_OTHER): Payer: BC Managed Care – PPO

## 2021-10-23 DIAGNOSIS — I5022 Chronic systolic (congestive) heart failure: Secondary | ICD-10-CM

## 2021-10-23 DIAGNOSIS — Z95810 Presence of automatic (implantable) cardiac defibrillator: Secondary | ICD-10-CM

## 2021-10-25 NOTE — Progress Notes (Signed)
EPIC Encounter for ICM Monitoring ? ?Patient Name: Alison Howard is a 61 y.o. female ?Date: 10/25/2021 ?Primary Care Physican: Shirline Frees, MD ?Primary Cardiologist: Aundra Dubin ?Electrophysiologist: Caryl Comes ?08/09/2021 Weight: 225 lbs ?08/27/2021 Weight: 224.4 lbs ?232 lbs  ?  ?      Spoke with patient and heart failure questions reviewed. She is feeling well today.        ?  ?Corvue thoracic impedance suggesting normal fluid levels. ?  ?Prescribed:  ?Furosemide 20 mg Take 1 tablet (20 mg) by mouth every other day.  ?Potassium 20 mEq take 1 tablet by mouth every other day. ?  ?Labs: ?09/27/2021 Creatinine 1.21, BUN 11, Potassium 4.3, Sodium 136, GFR 51 ?08/14/2021 Creatinine 1.15, BUN 20, Potassium 4.4, Sodium 134, GFR 55 ?08/08/2021 Creatinine 0.85, BUN 10, Potassium 4.3, Sodium 139, GFR >60 ?08/07/2021 Creatinine 0.78, BUN 8,   Potassium 4.1, Sodium 134, GFR >60 (3:07 PM) ?A complete set of results can be found in Results Review. ?  ?Recommendations: No changes and encouraged to call if experiencing any fluid symptoms. ?  ?Follow-up plan: ICM clinic phone appointment on 11/25/2021.   91 day device clinic remote transmission 10/04/2021.   ?  ?EP/Cardiology next office visit:  11/06/2021 with [facility] PA/NP.  10/31/2021 with Dr Lovena Le (post ablation). ?  ?Copy of ICM check sent to Dr. Caryl Comes.     ? ?3 month ICM trend: 10/23/2021. ? ? ? ?12-14 Month ICM trend:  ? ? ? ?[name], RN ?10/25/2021 ?4:03 PM ? ?

## 2021-10-31 ENCOUNTER — Ambulatory Visit (INDEPENDENT_AMBULATORY_CARE_PROVIDER_SITE_OTHER): Payer: BC Managed Care – PPO | Admitting: Internal Medicine

## 2021-10-31 ENCOUNTER — Encounter: Payer: Self-pay | Admitting: Internal Medicine

## 2021-10-31 VITALS — BP 102/68 | HR 67 | Ht 62.0 in | Wt 229.6 lb

## 2021-10-31 DIAGNOSIS — I472 Ventricular tachycardia, unspecified: Secondary | ICD-10-CM

## 2021-10-31 DIAGNOSIS — Z95810 Presence of automatic (implantable) cardiac defibrillator: Secondary | ICD-10-CM

## 2021-10-31 DIAGNOSIS — I429 Cardiomyopathy, unspecified: Secondary | ICD-10-CM

## 2021-10-31 NOTE — Patient Instructions (Addendum)
Medication Instructions:  ?Your physician recommends that you continue on your current medications as directed. Please refer to the Current Medication list given to you today. ? ?Labwork: ?None ordered. ? ?Testing/Procedures: ?None ordered. ? ?Follow-Up: ?Your physician wants you to follow-up in: July 2023 with Dr. Caryl Comes. ? ?Remote monitoring is used to monitor your ICD from home. This monitoring reduces the number of office visits required to check your device to one time per year. It allows [place] to keep an eye on the functioning of your device to ensure it is working properly. You are scheduled for a device check from home on 11/25/2021. You may send your transmission at any time that day. If you have a wireless device, the transmission will be sent automatically. After your physician reviews your transmission, you will receive a postcard with your next transmission date. ? ?Any Other Special Instructions Will Be Listed Below (If Applicable). ? ?If you need a refill on your cardiac medications before your next appointment, please call your pharmacy.  ? ? ? ? ?

## 2021-10-31 NOTE — Progress Notes (Signed)
? ? ? ? ?HPI ?Alison Howard returns today for followup. She is a pleasant 61 yo woman with a  h/o VT who underwent EP study and catheter ablation several weeks ago. She had a superobasilar VT from the region near the mitral valve annulus. Catheter ablation had no effect on her VT. In the interim she notes no palpitations, chest pain or sob. She has not had any ICD shocks. She is off of AA drugs.  ?Allergies  ?Allergen Reactions  ? Ciprofloxacin Hcl Anaphylaxis  ? Codeine Anaphylaxis and Nausea And Vomiting  ? Morphine And Related Anaphylaxis  ? Sulfa Antibiotics Anaphylaxis  ? Zetia [Ezetimibe] Rash  ?  Rash and muscle aches  ? Atorvastatin   ?  Myalgias   ? Dapagliflozin Hives  ? Rosuvastatin   ?  Myalgias  ? Simvastatin   ?  Myalgias   ? Welchol [Colesevelam]   ?  myalgias  ? ? ? ?Current Outpatient Medications  ?Medication Sig Dispense Refill  ? bisoprolol (ZEBETA) 5 MG tablet Take 1 tablet (5 mg total) by mouth 2 (two) times daily. 180 tablet 3  ? citalopram (CELEXA) 40 MG tablet Take 40 mg by mouth daily.     ? eplerenone (INSPRA) 50 MG tablet TAKE 1 TABLET BY MOUTH EVERY DAY 90 tablet 2  ? furosemide (LASIX) 20 MG tablet Take 1 tablet (20 mg total) by mouth every other day. 90 tablet 3  ? LORazepam (ATIVAN) 0.5 MG tablet Take 0.5 mg by mouth 2 (two) times daily as needed for anxiety.    ? magnesium oxide (MAG-OX) 400 (240 Mg) MG tablet Take 1 tablet (400 mg total) by mouth 2 (two) times daily. 60 tablet 6  ? pantoprazole (PROTONIX) 40 MG tablet Take 40 mg by mouth 2 (two) times daily.     ? potassium chloride SA (KLOR-CON M) 20 MEQ tablet Take 1 tablet (20 mEq total) by mouth every other day. 90 tablet 3  ? pravastatin (PRAVACHOL) 80 MG tablet TAKE UP TO 1 TABLET DAILY AS TOLERATED 90 tablet 1  ? sacubitril-valsartan (ENTRESTO) 24-26 MG Take 1 tablet by mouth 2 (two) times daily. 60 tablet 11  ? ?No current facility-administered medications for this visit.  ? ? ? ?Past Medical History:  ?Diagnosis Date  ? CHF  (congestive heart failure) ([place])   ? Chronic systolic heart failure ([place]) 04/25/2013  ? GERD (gastroesophageal reflux disease)   ? Hypercholesteremia   ? LBBB (left bundle branch block)   ? Morbid obesity ([place]) 12/28/2014  ? NICM (nonischemic cardiomyopathy) ([place])   ? a. s/p STJ CRTD  ? OSA (obstructive sleep apnea) 12/28/2014  ? Severe with AHI 27 events per hour on average and the highest AHI was 40 events per hour  ? Ventricular tachycardia ([place])   ? a. s/p appropriate ICD therapy  ? ? ?ROS: ? ? All systems reviewed and negative except as noted in the HPI. ? ? ?Past Surgical History:  ?Procedure Laterality Date  ? BIV ICD GENERATOR CHANGEOUT N/A 02/17/2018  ? Procedure: BIV ICD GENERATOR CHANGEOUT;  Surgeon: Deboraha Sprang, MD;  Location: [facility];  Service: Cardiovascular;  Laterality: N/A;  ? CARDIAC DEFIBRILLATOR PLACEMENT  2012  ? a. STJ CRTD implanted for NICM, CHF  ? RIGHT/LEFT HEART CATH AND CORONARY ANGIOGRAPHY N/A 10/07/2018  ? Procedure: RIGHT/LEFT HEART CATH AND CORONARY ANGIOGRAPHY;  Surgeon: Larey Dresser, MD;  Location: [facility];  Service: Cardiovascular;  Laterality: N/A;  ?  TOTAL ABDOMINAL HYSTERECTOMY    ? V TACH ABLATION N/A 10/01/2021  ? Procedure: V TACH ABLATION;  Surgeon: Evans Lance, MD;  Location: [facility];  Service: Cardiovascular;  Laterality: N/A;  ? ? ? ?Family History  ?Problem Relation Age of Onset  ? Coronary artery disease Mother   ? Heart attack Mother   ? Hyperlipidemia Sister   ? Heart disease Father   ? ? ? ?Social History  ? ?Socioeconomic History  ? Marital status: Single  ?  Spouse name: Not on file  ? Number of children: 0  ? Years of education: Not on file  ? Highest education level: Not on file  ?Occupational History  ? Not on file  ?Tobacco Use  ? Smoking status: Never  ? Smokeless tobacco: Never  ?Vaping Use  ? Vaping Use: Never used  ?Substance and Sexual Activity  ? Alcohol use: Yes  ? Drug use: No  ? Sexual activity: Not on file  ?Other  Topics Concern  ? Not on file  ?Social History Narrative  ? Not on file  ? ?Social Determinants of Health  ? ?Financial Resource Strain: Not on file  ?Food Insecurity: Not on file  ?Transportation Needs: Not on file  ?Physical Activity: Not on file  ?Stress: Not on file  ?Social Connections: Not on file  ?Intimate Partner Violence: Not on file  ? ? ? ?BP 102/68   Pulse 67   Ht 5\' 2"  (1.575 m)   Wt 229 lb 9.6 oz (104.1 kg)   SpO2 98%   BMI 41.99 kg/m?  ? ?Physical Exam: ? ?Well appearing NAD ?HEENT: Unremarkable ?Neck:  No JVD, no thyromegally ?Lymphatics:  No adenopathy ?Back:  No CVA tenderness ?Lungs:  Clear with no wheezes ?HEART:  Regular rate rhythm, no murmurs, no rubs, no clicks ?Abd:  soft, positive bowel sounds, no organomegally, no rebound, no guarding ?Ext:  2 plus pulses, no edema, no cyanosis, no clubbing ?Skin:  No rashes no nodules ?Neuro:  CN II through XII intact, motor grossly intact ? ?EKG - NSR with biv pacing ? ?DEVICE  ?Normal device function.  See PaceArt for details.  ? ?Assess/Plan:  ? ?Recurrent and sustained VT - she has had VT ablation several weeks ago which was unsuccessful. In the interim she notes no palpitations and interrogation of her ICD demonstrates no VT. She will undergo watchful waiting. ?Non-ischemic CM - she appears euvolemic. She will continue her current meds. ?Obesity - her weight is down over 10 lbs from 8 months ago. ?  ?Carleene Overlie Rotha Cassels,MD ?

## 2021-11-04 NOTE — Progress Notes (Signed)
?   ? ?ID:  Alison Howard, DOB [birth year], MRN [id number]   ?Provider location: [facility] ?Type of Visit: Established patient ? ?PCP:  Johny Blamer, MD  ?HF Cardiologist:  Dr. Shirlee Latch ?  ?HPI: ?Alison Howard is a 61 y.o. female who has a history of CHF and VT and was referred by Dr. Graciela Husbands for evaluation of CHF. She has a long history of cardiomyopathy, dating back to at least 2013.  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  She has had VT in the past terminated by [employer].  She has also had up to 22% PVCs in the past, down to 3.1% on 10/19 holter. She is currently on flecainide to suppress the PVCs.  Echo in 12/19 showed severe dilation of the LV with EF 25-30%.  ?  ?RHC/LHC in 3/20 showed normal filling pressures, CI 2.37, and no significant CAD. CPX in 3/20 showed no significant HF limitation, main problem appeared to be deconditioning.  Echo in 5/22 showed EF 35-40% with basal inferoseptal, basal anteroseptal, and basal inferior akinesis.  ? ?She was admitted in 1/23 with CHF and runs of NSVT.  ICD did not discharge. She was diuresed and flecainide was stopped and replaced with amiodarone. No further VT.  ? ?Follow up 3/23, BP on low side and Entresto reduced, ReDs 26%.  ? ?S/p VT ablation, unfortunately unsuccessful. Remained off amio and fleccanide.  ? ?Today she returns for HF follow up. Overall feeling fine. No significant dyspnea with exertion. No further dizziness or orthostatic symptoms on reduced Entresto dose. Denies palpitations, CP, dizziness, edema, or PND/Orthopnea. Appetite ok. No fever or chills. Weight at home 230 pounds. Taking all medications.  ? ?St Jude device interrogation: stable thoracic impedence, >99% Bi-V pacing. ?  ?Labs (10/19): K 4.3, creatinine 0.88 ?Labs (3/20): K 4.3, creatinine 0.88 ?Labs (6/20): K 4.4, creatinine 1.02 ?Labs (11/20): K 4.5 creatinine 0.88, hgb 14.4 ?Labs (9/21): K 4, creatinine 0.96, LDL 185, HDL 70 ?Labs (2/22): LDL 173, K 4.2, creatinine 0.86 ?Labs  (1/23): K 4.3, creatinine 0.85, TSH and free T4 elevated ?Labs (3/23): K 4.3, creatinine 1.21 ? ?ECG (personally reviewed): none ordered today.  ?  ?PMH:  ?1. Chronic systolic CHF: Nonischemic cardiomyopathy. St Jude BiV ICD.  ?- Echo (8/13): EF 40-45% ?- Echo (12/15): EF 25-30% ?- Echo (6/17): EF 35% ?- Echo (6/18): EF 40-45% ?- Echo (11/18): EF 35-40% ?- Echo (12/19): EF 25-30%, severe LV dilation, mild MR.  ?- RHC/LHC (3/20): no CAD.  Mean RA 4, PA 27/10 mean 16, mean PCWP 5, CI 2.37.  ?- CPX (3/20): peak VO2 16.9 (97% predicted), VE/VCO2 23, RER 1.14 => no significant HF limitation, primarily limited by deconditioning.  ?- Cardiac MRI (6/20): Very difficult study due to pacemaker artifact.  EF 34%, delayed enhancement images not interpretable.  ?- Echo (5/22): EF 35-40% with basal inferoseptal, basal anteroseptal, and basal inferior akinesis.  ?2. PVCs/VT:  ?- 6/18 holter 22% PVCs ?- 10/19 holter 3.1% PVCs ?- s/p unsuccessful VT ablation 3/23. ?3. OSA ?4. Hyperlipidemia: Unable to tolerate statins, Zetia.  ?5. Obesity ? ?Current Outpatient Medications  ?Medication Sig Dispense Refill  ? bisoprolol (ZEBETA) 5 MG tablet Take 1 tablet (5 mg total) by mouth 2 (two) times daily. 180 tablet 3  ? citalopram (CELEXA) 40 MG tablet Take 40 mg by mouth daily.     ? eplerenone (INSPRA) 50 MG tablet TAKE 1 TABLET BY MOUTH EVERY DAY 90 tablet 2  ? furosemide (  LASIX) 20 MG tablet Take 1 tablet (20 mg total) by mouth every other day. 90 tablet 3  ? LORazepam (ATIVAN) 0.5 MG tablet Take 0.5 mg by mouth 2 (two) times daily as needed for anxiety.    ? magnesium oxide (MAG-OX) 400 (240 Mg) MG tablet Take 1 tablet (400 mg total) by mouth 2 (two) times daily. 60 tablet 6  ? pantoprazole (PROTONIX) 40 MG tablet Take 40 mg by mouth 2 (two) times daily.     ? potassium chloride SA (KLOR-CON M) 20 MEQ tablet Take 1 tablet (20 mEq total) by mouth every other day. 90 tablet 3  ? pravastatin (PRAVACHOL) 80 MG tablet Take 80 mg by mouth  daily.    ? sacubitril-valsartan (ENTRESTO) 24-26 MG Take 1 tablet by mouth 2 (two) times daily. 60 tablet 11  ? ?No current facility-administered medications for this encounter.  ? ?Allergies:   Ciprofloxacin hcl, Codeine, Morphine and related, Sulfa antibiotics, Zetia [ezetimibe], Atorvastatin, Dapagliflozin, Rosuvastatin, Simvastatin, and Welchol [colesevelam]  ? ?Social History:  The patient  reports that she has never smoked. She has never used smokeless tobacco. She reports current alcohol use. She reports that she does not use drugs.  ? ?Family History:  The patient's family history includes Coronary artery disease in her mother; Heart attack in her mother; Heart disease in her father; Hyperlipidemia in her sister.  ? ?ROS:  Please see the history of present illness.   All other systems are personally reviewed and negative.  ? ?Exam:   ?BP (!) 90/50   Pulse 68   Wt 105.5 kg (232 lb 9.6 oz)   SpO2 96%   BMI 42.54 kg/m?  ?General:  NAD. No resp difficulty ?HEENT: Normal ?Neck: Supple. Thick neck. Carotids 2+ bilat; no bruits. No lymphadenopathy or thryomegaly appreciated. ?Cor: PMI nondisplaced. Regular rate & rhythm. No rubs, gallops or murmurs. ?Lungs: Clear ?Abdomen: Obese, soft, nontender, nondistended. No hepatosplenomegaly. No bruits or masses. Good bowel sounds. ?Extremities: No cyanosis, clubbing, rash, edema ?Neuro: Alert & oriented x 3, cranial nerves grossly intact. Moves all 4 extremities w/o difficulty. Affect pleasant. ? ?Recent Labs: ?08/07/2021: TSH 11.321 ?08/08/2021: Magnesium 2.1 ?08/26/2021: ALT 11 ?09/27/2021: B Natriuretic Peptide 181.0; BUN 11; Creatinine, Ser 1.21; Hemoglobin 14.2; Platelets 368; Potassium 4.3; Sodium 136  ?Personally reviewed  ? ?Wt Readings from Last 3 Encounters:  ?11/06/21 105.5 kg (232 lb 9.6 oz)  ?10/31/21 104.1 kg (229 lb 9.6 oz)  ?10/02/21 103.3 kg (227 lb 11.8 oz)  ? ?ASSESSMENT AND PLAN: ?1. Chronic systolic CHF: Long history of nonischemic cardiomyopathy.  Most  recent echo in 10/19 with severe LV dilation and EF 25-30%.  Has had frequent PVCs in the past, but most recent holter in 10/19 showed PVC count down to 3.1% on flecainide.  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 RHC/LHC in 3/20 with no significant CAD, normal feeling pressures, and preserved cardiac output.  CPX showed deconditioning but no significant HF limitation. Cardiac MRI in 6/20 was a very difficult study due to pacemaker artifact, LV EF 34% but LGE was not interpretable.  Echo in 5/22 showed EF 35-40% with basal inferoseptal, basal anteroseptal, and basal inferior akinesis.  NYHA class II symptoms, not volume overloaded by exam or Corvue today.- Continue Entresto 24/26 mg bid (decreased due to orthostatic symptoms) ?- Continue bisoprolol 5 mg bid. Recent labs ok, SCr 1.21, K 4.3 on 09/27/21. ?- Continue eplerenone 50 mg daily.  ?- Continue Lasix 20 mg qod and KCl to 10  mEq qod for now. May need to increase to daily with reduction in [place]. ?- She was unable to tolerate dapagliflozin due to itching, and her insurance does not cover empagliflozin.  ?2. PVCs: Count down to 3.1% with holter in 10/19 but EF remained 25-30% by echo. Suspect not primarily a PVC-mediated CMP therefore.  PVCs likely are a consequence of the cardiomyopathy.  She had symptomatic NSVT runs in 1/23 and flecainide was stopped, amiodarone started.  No palpitations.  ?- Now, s/p unsuccessful VT ablation 3/23. EP in contact with Dr. Berneice Gandy for consideration of re-attempt. ?- Remains off amiodarone. ?3. Obesity: Body mass index is 42.54 kg/m?Marland Kitchen Continue efforts at diet/exercise for weight loss.  ?- Will not refer for semaglutide as she does not want to use injectable meds. ?4. Hyperlipidemia: Markedly high LDL 153. She will not use any injectable meds.   She is on pravastatin 80 mg daily.   ?- [facility] Pharmacist has discussed further options with her.  ?- She prefers to stay on her pravastatin, cannot tolerate Zetia. ?5. OSA: Unable to  tolerate CPAP. ? ?Followup in 5-6 months with Dr. Shirlee Latch.  ? ?Signed, ?Jacklynn Ganong, FNP  ?11/06/2021 ? ?[facility] ?[place] ?[address] ?[facility] ?[place] [address]

## 2021-11-06 ENCOUNTER — Ambulatory Visit (HOSPITAL_COMMUNITY)
Admission: RE | Admit: 2021-11-06 | Discharge: 2021-11-06 | Disposition: A | Discharge status: Home / Self Care | Payer: BC Managed Care – PPO | Source: Ambulatory Visit | Attending: Family Medicine | Admitting: Family Medicine

## 2021-11-06 ENCOUNTER — Encounter (HOSPITAL_COMMUNITY): Payer: Self-pay

## 2021-11-06 VITALS — BP 90/50 | HR 68 | Wt 232.6 lb

## 2021-11-06 DIAGNOSIS — I5022 Chronic systolic (congestive) heart failure: Secondary | ICD-10-CM | POA: Diagnosis not present

## 2021-11-06 DIAGNOSIS — Z79899 Other long term (current) drug therapy: Secondary | ICD-10-CM | POA: Insufficient documentation

## 2021-11-06 DIAGNOSIS — G4733 Obstructive sleep apnea (adult) (pediatric): Secondary | ICD-10-CM | POA: Diagnosis not present

## 2021-11-06 DIAGNOSIS — E669 Obesity, unspecified: Secondary | ICD-10-CM | POA: Insufficient documentation

## 2021-11-06 DIAGNOSIS — Z950 Presence of cardiac pacemaker: Secondary | ICD-10-CM | POA: Insufficient documentation

## 2021-11-06 DIAGNOSIS — I493 Ventricular premature depolarization: Secondary | ICD-10-CM | POA: Diagnosis not present

## 2021-11-06 DIAGNOSIS — E785 Hyperlipidemia, unspecified: Secondary | ICD-10-CM | POA: Insufficient documentation

## 2021-11-06 DIAGNOSIS — E782 Mixed hyperlipidemia: Secondary | ICD-10-CM

## 2021-11-06 DIAGNOSIS — Z6841 Body Mass Index (BMI) 40.0 and over, adult: Secondary | ICD-10-CM | POA: Insufficient documentation

## 2021-11-06 DIAGNOSIS — I428 Other cardiomyopathies: Secondary | ICD-10-CM | POA: Insufficient documentation

## 2021-11-06 NOTE — Patient Instructions (Signed)
Thank you for coming in today ? ?Your physician recommends that you schedule a follow-up appointment in:  ?Please call in June for August appointment  ? ?At the [facility], you and your health needs are our priority. As part of our continuing mission to provide you with exceptional heart care, we have created designated Provider Care Teams. These Care Teams include your primary Cardiologist (physician) and Advanced Practice Providers (APPs- Physician Assistants and Nurse Practitioners) who all work together to provide you with the care you need, when you need it.  ? ?You may see any of the following providers on your designated Care Team at your next follow up: ?Dr Glori Bickers ?Dr Loralie Champagne ?Darrick Grinder, NP ?Lyda Jester, PA ?Jessica Milford,NP ?Marlyce Huge, PA ?Audry Riles, PharmD ? ? ?Please be sure to bring in all your medications bottles to every appointment.  ? ?If you have any questions or concerns before your next appointment please send [place] a message through [place] or call our office at [phone].   ? ?TO LEAVE A MESSAGE FOR THE NURSE SELECT OPTION 2, PLEASE LEAVE A MESSAGE INCLUDING: ?YOUR NAME ?DATE OF BIRTH ?CALL BACK NUMBER ?REASON FOR CALL**this is important as we prioritize the call backs ? ?YOU WILL RECEIVE A CALL BACK THE SAME DAY AS LONG AS YOU CALL BEFORE 4:00 PM ? ?

## 2021-11-25 ENCOUNTER — Ambulatory Visit (INDEPENDENT_AMBULATORY_CARE_PROVIDER_SITE_OTHER): Payer: BC Managed Care – PPO

## 2021-11-25 DIAGNOSIS — I5022 Chronic systolic (congestive) heart failure: Secondary | ICD-10-CM | POA: Diagnosis not present

## 2021-11-25 DIAGNOSIS — Z95810 Presence of automatic (implantable) cardiac defibrillator: Secondary | ICD-10-CM

## 2021-11-29 NOTE — Progress Notes (Signed)
EPIC Encounter for ICM Monitoring ? ?Patient Name: Alison Howard is a 61 y.o. female ?Date: 11/29/2021 ?Primary Care Physican: Johny Blamer, MD ?Primary Cardiologist: Shirlee Latch ?Electrophysiologist: Graciela Husbands ?08/09/2021 Weight: 225 lbs ?08/27/2021 Weight: 224.4 lbs ?11/29/2021 Weight: 230 lbs  ?  ?      Spoke with patient and heart failure questions reviewed.  Pt asymptomatic for fluid accumulation.  Reports feeling well at this time and voices no complaints.   ?  ?Corvue thoracic impedance suggesting normal fluid levels. ?  ?Prescribed:  ?Furosemide 20 mg Take 1 tablet (20 mg) by mouth every other day.  ?Potassium 20 mEq take 1 tablet by mouth every other day. ?  ?Labs: ?09/27/2021 Creatinine 1.21, BUN 11, Potassium 4.3, Sodium 136, GFR 51 ?08/14/2021 Creatinine 1.15, BUN 20, Potassium 4.4, Sodium 134, GFR 55 ?08/08/2021 Creatinine 0.85, BUN 10, Potassium 4.3, Sodium 139, GFR >60 ?08/07/2021 Creatinine 0.78, BUN 8,   Potassium 4.1, Sodium 134, GFR >60 (3:07 PM) ?A complete set of results can be found in Results Review. ?  ?Recommendations:  No changes and encouraged to call if experiencing any fluid symptoms. ?  ?Follow-up plan: ICM clinic phone appointment on 12/30/2021.   91 day device clinic remote transmission 01/03/2022.   ?  ?EP/Cardiology next office visit: Recall 03/26/2022 with [facility] PA/NP.  01/29/2022 with Dr Graciela Husbands. ?  ?Copy of ICM check sent to Dr. Graciela Husbands.     ? ?3 month ICM trend: 11/25/2021. ? ? ? ?12-14 Month ICM trend:  ? ? ? ?Karie Soda, RN ?11/29/2021 ?12:44 PM ? ?

## 2021-12-30 ENCOUNTER — Ambulatory Visit (INDEPENDENT_AMBULATORY_CARE_PROVIDER_SITE_OTHER): Payer: BC Managed Care – PPO

## 2021-12-30 DIAGNOSIS — I5022 Chronic systolic (congestive) heart failure: Secondary | ICD-10-CM | POA: Diagnosis not present

## 2021-12-30 DIAGNOSIS — Z95810 Presence of automatic (implantable) cardiac defibrillator: Secondary | ICD-10-CM | POA: Diagnosis not present

## 2021-12-30 NOTE — Progress Notes (Signed)
EPIC Encounter for ICM Monitoring  Patient Name: Alison Howard is a 61 y.o. female Date: 12/30/2021 Primary Care Physican: Johny Blamer, MD Primary Cardiologist: Shirlee Latch Electrophysiologist: Graciela Husbands 08/09/2021 Weight: 225 lbs 08/27/2021 Weight: 224.4 lbs 11/29/2021 Weight: 230 lbs  12/30/2021 Weight: 230 lbs         Spoke with patient and heart failure questions reviewed.  Pt reports productive cough and weight gain of 3 lbs during decreased impedance.   Her symptoms have resolved since fluid levels returned close to baseline.  She may have had allergies as well as possible fluid accumulation.       Corvue thoracic impedance suggesting possible fluid accumulation starting 5/23 and returned close to baseline.   Prescribed:  Furosemide 20 mg Take 1 tablet (20 mg) by mouth every other day.  Potassium 20 mEq take 1 tablet by mouth every other day.   Labs: 09/27/2021 Creatinine 1.21, BUN 11, Potassium 4.3, Sodium 136, GFR 51 08/14/2021 Creatinine 1.15, BUN 20, Potassium 4.4, Sodium 134, GFR 55 08/08/2021 Creatinine 0.85, BUN 10, Potassium 4.3, Sodium 139, GFR >60 08/07/2021 Creatinine 0.78, BUN 8,   Potassium 4.1, Sodium 134, GFR >60 (3:07 PM) A complete set of results can be found in Results Review.   Recommendations:  No changes and encouraged to call if experiencing any fluid symptoms.   Follow-up plan: ICM clinic phone appointment on 02/03/2022.   91 day device clinic remote transmission 01/03/2022.     EP/Cardiology next office visit: Recall 03/26/2022 with [facility] PA/NP.  01/29/2022 with Dr Graciela Husbands.   Copy of ICM check sent to Dr. Graciela Husbands.     3 month ICM trend: 12/30/2021.    12-14 Month ICM trend:     Karie Soda, RN 12/30/2021 3:41 PM

## 2022-01-01 DIAGNOSIS — R051 Acute cough: Secondary | ICD-10-CM | POA: Diagnosis not present

## 2022-01-01 DIAGNOSIS — J0100 Acute maxillary sinusitis, unspecified: Secondary | ICD-10-CM | POA: Diagnosis not present

## 2022-01-03 ENCOUNTER — Ambulatory Visit (INDEPENDENT_AMBULATORY_CARE_PROVIDER_SITE_OTHER): Payer: BC Managed Care – PPO

## 2022-01-03 DIAGNOSIS — I429 Cardiomyopathy, unspecified: Secondary | ICD-10-CM | POA: Diagnosis not present

## 2022-01-03 LAB — CUP PACEART REMOTE DEVICE CHECK
Battery Remaining Longevity: 48 mo
Battery Remaining Percentage: 53 %
Battery Voltage: 2.95 V
Brady Statistic AP VP Percent: 19 %
Brady Statistic AP VS Percent: 1 %
Brady Statistic AS VP Percent: 79 %
Brady Statistic AS VS Percent: 1 %
Brady Statistic RA Percent Paced: 16 %
Date Time Interrogation Session: 20230609020035
HighPow Impedance: 61 Ohm
HighPow Impedance: 62 Ohm
Implantable Lead Implant Date: 20120529
Implantable Lead Implant Date: 20120529
Implantable Lead Implant Date: 20120529
Implantable Lead Location: 753858
Implantable Lead Location: 753859
Implantable Lead Location: 753860
Implantable Pulse Generator Implant Date: 20190724
Lead Channel Impedance Value: 1100 Ohm
Lead Channel Impedance Value: 430 Ohm
Lead Channel Impedance Value: 510 Ohm
Lead Channel Pacing Threshold Amplitude: 1 V
Lead Channel Pacing Threshold Amplitude: 1.25 V
Lead Channel Pacing Threshold Amplitude: 1.875 V
Lead Channel Pacing Threshold Pulse Width: 0.5 ms
Lead Channel Pacing Threshold Pulse Width: 0.5 ms
Lead Channel Pacing Threshold Pulse Width: 0.5 ms
Lead Channel Sensing Intrinsic Amplitude: 12 mV
Lead Channel Sensing Intrinsic Amplitude: 2.8 mV
Lead Channel Setting Pacing Amplitude: 2 V
Lead Channel Setting Pacing Amplitude: 2.25 V
Lead Channel Setting Pacing Amplitude: 2.375
Lead Channel Setting Pacing Pulse Width: 0.5 ms
Lead Channel Setting Pacing Pulse Width: 0.5 ms
Lead Channel Setting Sensing Sensitivity: 0.5 mV
Pulse Gen Serial Number: 9842506

## 2022-01-09 NOTE — Progress Notes (Signed)
Remote ICD transmission.   

## 2022-01-29 ENCOUNTER — Ambulatory Visit (INDEPENDENT_AMBULATORY_CARE_PROVIDER_SITE_OTHER): Payer: BC Managed Care – PPO | Admitting: Internal Medicine

## 2022-01-29 ENCOUNTER — Encounter: Payer: Self-pay | Admitting: Internal Medicine

## 2022-01-29 VITALS — HR 68 | Ht 62.0 in | Wt 232.0 lb

## 2022-01-29 DIAGNOSIS — Z95810 Presence of automatic (implantable) cardiac defibrillator: Secondary | ICD-10-CM | POA: Diagnosis not present

## 2022-01-29 DIAGNOSIS — I5022 Chronic systolic (congestive) heart failure: Secondary | ICD-10-CM

## 2022-01-29 DIAGNOSIS — Z79899 Other long term (current) drug therapy: Secondary | ICD-10-CM

## 2022-01-29 DIAGNOSIS — I428 Other cardiomyopathies: Secondary | ICD-10-CM | POA: Diagnosis not present

## 2022-01-29 DIAGNOSIS — E039 Hypothyroidism, unspecified: Secondary | ICD-10-CM | POA: Diagnosis not present

## 2022-01-29 DIAGNOSIS — I472 Ventricular tachycardia, unspecified: Secondary | ICD-10-CM

## 2022-01-29 NOTE — Patient Instructions (Addendum)
Medication Instructions:  Your physician recommends that you continue on your current medications as directed. Please refer to the Current Medication list given to you today.  *If you need a refill on your cardiac medications before your next appointment, please call your pharmacy*   Lab Work:  TSH today  If you have labs (blood work) drawn today and your tests are completely normal, you will receive your results only by: MyChart Message (if you have MyChart) OR A paper copy in the mail If you have any lab test that is abnormal or we need to change your treatment, we will call you to review the results.   Testing/Procedures: None ordered.    Follow-Up: At [facility], you and your health needs are our priority.  As part of our continuing mission to provide you with exceptional heart care, we have created designated Provider Care Teams.  These Care Teams include your primary Cardiologist (physician) and Advanced Practice Providers (APPs -  Physician Assistants and Nurse Practitioners) who all work together to provide you with the care you need, when you need it.  We recommend signing up for the patient portal called "MyChart".  Sign up information is provided on this After Visit Summary.  MyChart is used to connect with patients for Virtual Visits (Telemedicine).  Patients are able to view lab/test results, encounter notes, upcoming appointments, etc.  Non-urgent messages can be sent to your provider as well.   To learn more about what you can do with MyChart, go to [email or web address].    Your next appointment:   6 months with Dr Graciela Husbands  Important Information About Sugar

## 2022-01-29 NOTE — Progress Notes (Signed)
Patient ID: Alison Howard, female   DOB: 03-21-1961, 61 y.o.   MRN: [id number]      Patient Care Team: Johny Blamer, MD as PCP - General (Family Medicine) Laurey Morale, MD as PCP - Advanced Heart Failure (Cardiology)   HPI  Alison Howard is a 61 y.o. female Seen in followup for CRT-D [employer]  (2013; gen change 2019 )implanted at [facility] for cardiomyopathy presumed nonischemic of >5 yrs duration;   Was taking flecainide for PVCs  Recurrent ventricular tachycardia.  Flecainide was discontinued placed on amiodarone; underwent EP study with GT ablation was unable to impact the ventricular tachycardia   Some dyspnea with mod exertion, no chest pain edema  No intercurrent VT off amiodarone      DATE TEST EF%   8/13  Echo  40-45%   12/15    Echo   25-30 %   6/17    Echo   35%   6/18 Echo   40-45%   11/18 Echo  35-40%   12/19 Echo 25-30%   6/20 Cardiac MRI    5/22 Echo  35-40%    DATE PVC %   8/17 11 Multiple morphologies  6/18 22  Multiple morphologies  10/18 5.2 From device   7/19 >10%   6/22 2.6% From device   Date Cr K Hgb TSH LFT  5/17  0.8 4.1     6/18  0.78 4.2     10/19 0.88 4.3     10/20 1.00 4.3     4/21 0.96 4.0      5/22 0.92 4.0     1/23 1.21 4.3  11.321 11   Past Medical History:  Diagnosis Date   CHF (congestive heart failure) (HCC)    Chronic systolic heart failure (HCC) 04/25/2013   GERD (gastroesophageal reflux disease)    Hypercholesteremia    LBBB (left bundle branch block)    Morbid obesity (HCC) 12/28/2014   NICM (nonischemic cardiomyopathy) (HCC)    a. s/p STJ CRTD   OSA (obstructive sleep apnea) 12/28/2014   Severe with AHI 27 events per hour on average and the highest AHI was 40 events per hour   Ventricular tachycardia (HCC)    a. s/p appropriate ICD therapy    Past Surgical History:  Procedure Laterality Date   BIV ICD GENERATOR CHANGEOUT N/A 02/17/2018   Procedure: BIV ICD GENERATOR CHANGEOUT;  Surgeon: Duke Salvia, MD;   Location: [facility] INVASIVE CV LAB;  Service: Cardiovascular;  Laterality: N/A;   CARDIAC DEFIBRILLATOR PLACEMENT  2012   a. STJ CRTD implanted for NICM, CHF   RIGHT/LEFT HEART CATH AND CORONARY ANGIOGRAPHY N/A 10/07/2018   Procedure: RIGHT/LEFT HEART CATH AND CORONARY ANGIOGRAPHY;  Surgeon: Laurey Morale, MD;  Location: [facility] INVASIVE CV LAB;  Service: Cardiovascular;  Laterality: N/A;   TOTAL ABDOMINAL HYSTERECTOMY     V TACH ABLATION N/A 10/01/2021   Procedure: V TACH ABLATION;  Surgeon: Marinus Maw, MD;  Location: [facility] INVASIVE CV LAB;  Service: Cardiovascular;  Laterality: N/A;    Current Outpatient Medications  Medication Sig Dispense Refill   bisoprolol (ZEBETA) 5 MG tablet Take 1 tablet (5 mg total) by mouth 2 (two) times daily. 180 tablet 3   citalopram (CELEXA) 40 MG tablet Take 40 mg by mouth daily.      eplerenone (INSPRA) 50 MG tablet TAKE 1 TABLET BY MOUTH EVERY DAY 90 tablet 2   furosemide (LASIX) 20 MG tablet Take 1 tablet (20 mg total)  by mouth every other day. 90 tablet 3   LORazepam (ATIVAN) 0.5 MG tablet Take 0.5 mg by mouth 2 (two) times daily as needed for anxiety.     magnesium oxide (MAG-OX) 400 (240 Mg) MG tablet Take 1 tablet (400 mg total) by mouth 2 (two) times daily. 60 tablet 6   pantoprazole (PROTONIX) 40 MG tablet Take 40 mg by mouth 2 (two) times daily.      potassium chloride SA (KLOR-CON M) 20 MEQ tablet Take 1 tablet (20 mEq total) by mouth every other day. 90 tablet 3   pravastatin (PRAVACHOL) 80 MG tablet Take 80 mg by mouth daily.     sacubitril-valsartan (ENTRESTO) 24-26 MG Take 1 tablet by mouth 2 (two) times daily. 60 tablet 11   No current facility-administered medications for this visit.    Allergies  Allergen Reactions   Ciprofloxacin Hcl Anaphylaxis   Codeine Anaphylaxis and Nausea And Vomiting   Morphine And Related Anaphylaxis   Sulfa Antibiotics Anaphylaxis   Zetia [Ezetimibe] Rash    Rash and muscle aches   Atorvastatin     Myalgias     Dapagliflozin Hives   Rosuvastatin     Myalgias   Simvastatin     Myalgias    Welchol [Colesevelam]     myalgias    Review of Systems negative except from HPI and PMH  Physical Exam:    Pulse 68   Ht 5\' 2"  (1.575 m)   Wt 232 lb (105.2 kg)   SpO2 94%   BMI 42.43 kg/m  Well developed and Morbidly obese in no acute distress HENT normal Neck supple with JVP-flat Clear Device pocket well healed; without hematoma or erythema.  There is no tethering  Regular rate and rhythm, no  gallop No  murmur Abd-soft with active BS No Clubbing cyanosis  edema Skin-warm and dry A & Oriented  Grossly normal sensory and motor function  ECG sinus with P synchronous pacing with an upright QRS lead V1 and a QR in lead I     Assessment and  Plan  Nonischemic cardiomyopathy  Congestive heart failure- chronic-systolic  Ventricular tachycardia  Hypothyroidism  PVCs  Morbidly obese   PVCs are about 2.2%.  Blood pressure is about 110.  We will continue her on Entresto 24/26 eplerenone 50 bisoprolol 5 twice daily.  She is not a candidate for SGLT2 secondary to allergy.  Her free amiodarone labs identified her to be hypothyroidism we will repeat the TSH today.  In the event that she has recurrent ventricular tachycardia, will refer to Dr 10-12-1983 is awake

## 2022-01-30 LAB — TSH: TSH: 21.3 u[IU]/mL — ABNORMAL HIGH (ref 0.450–4.500)

## 2022-02-03 ENCOUNTER — Ambulatory Visit (INDEPENDENT_AMBULATORY_CARE_PROVIDER_SITE_OTHER): Payer: BC Managed Care – PPO

## 2022-02-03 DIAGNOSIS — I5022 Chronic systolic (congestive) heart failure: Secondary | ICD-10-CM

## 2022-02-03 DIAGNOSIS — Z95810 Presence of automatic (implantable) cardiac defibrillator: Secondary | ICD-10-CM | POA: Diagnosis not present

## 2022-02-04 ENCOUNTER — Telehealth: Payer: Self-pay

## 2022-02-04 NOTE — Progress Notes (Signed)
EPIC Encounter for ICM Monitoring  Patient Name: Alison Howard is a 61 y.o. female Date: 02/04/2022 Primary Care Physican: Johny Blamer, MD Primary Cardiologist: Shirlee Latch Electrophysiologist: Graciela Husbands 08/09/2021 Weight: 225 lbs 08/27/2021 Weight: 224.4 lbs 11/29/2021 Weight: 230 lbs  12/30/2021 Weight: 230 lbs         Attempted call to patient and unable to reach.  Left detailed message per DPR regarding transmission. Transmission reviewed.    Corvue thoracic impedance suggesting possible fluid accumulation from 6/20-7/2.     Prescribed:  Furosemide 20 mg Take 1 tablet (20 mg) by mouth every other day.  Potassium 20 mEq take 1 tablet by mouth every other day.   Labs: 09/27/2021 Creatinine 1.21, BUN 11, Potassium 4.3, Sodium 136, GFR 51 08/14/2021 Creatinine 1.15, BUN 20, Potassium 4.4, Sodium 134, GFR 55 08/08/2021 Creatinine 0.85, BUN 10, Potassium 4.3, Sodium 139, GFR >60 08/07/2021 Creatinine 0.78, BUN 8,   Potassium 4.1, Sodium 134, GFR >60 (3:07 PM) A complete set of results can be found in Results Review.   Recommendations:  Left voice mail with ICM number and encouraged to call if experiencing any fluid symptoms..   Follow-up plan: ICM clinic phone appointment on 03/10/2022.   91 day device clinic remote transmission 04/04/2022.     EP/Cardiology next office visit: Recall 03/26/2022 with [facility] PA/NP.  01/29/2022 with Dr Graciela Husbands.   Copy of ICM check sent to Dr. Graciela Husbands.      3 month ICM trend: 02/03/2022.  vvvv  12-14 Month ICM trend:     Karie Soda, RN 02/04/2022 4:12 PM

## 2022-02-04 NOTE — Telephone Encounter (Signed)
Remote ICM transmission received.  Attempted call to patient regarding ICM remote transmission and left detailed message per DPR.  Advised to return call for any fluid symptoms or questions. Next ICM remote transmission scheduled 03/10/2022.    

## 2022-02-07 DIAGNOSIS — Z0000 Encounter for general adult medical examination without abnormal findings: Secondary | ICD-10-CM | POA: Diagnosis not present

## 2022-02-11 ENCOUNTER — Telehealth: Payer: Self-pay

## 2022-02-11 NOTE — Telephone Encounter (Signed)
-----   Message from Jefferey Pica, RN sent at 02/07/2022  9:04 AM EDT -----  ----- Message ----- From: Duke Salvia, MD Sent: 02/06/2022   7:49 PM EDT To: Johny Blamer, MD; Jefferey Pica, RN  Please Inform Patient that TSH is abnormal and will require further eval  Plkease check free T3 and Free T4 and begin  levothyroxine 25 mcg and ask her to follow up with Dr Tiburcio Pea   Thanks

## 2022-02-11 NOTE — Telephone Encounter (Signed)
Spoke with Alison Howard who reports Dr Tiburcio Pea is currently following TSH.  He has started Alison Howard on Levothyroxine and she is to return on 8 weeks for lab work with him. Alison Howard thanked Charity fundraiser for the call.

## 2022-02-23 ENCOUNTER — Other Ambulatory Visit (HOSPITAL_COMMUNITY): Payer: Self-pay | Admitting: Cardiology

## 2022-03-14 ENCOUNTER — Other Ambulatory Visit: Payer: Self-pay | Admitting: Cardiology

## 2022-03-18 ENCOUNTER — Ambulatory Visit (INDEPENDENT_AMBULATORY_CARE_PROVIDER_SITE_OTHER): Payer: BC Managed Care – PPO

## 2022-03-18 DIAGNOSIS — Z95810 Presence of automatic (implantable) cardiac defibrillator: Secondary | ICD-10-CM

## 2022-03-18 DIAGNOSIS — I5022 Chronic systolic (congestive) heart failure: Secondary | ICD-10-CM | POA: Diagnosis not present

## 2022-03-18 NOTE — Progress Notes (Unsigned)
EPIC Encounter for ICM Monitoring  Patient Name: Alison Howard is a 61 y.o. female Date: 03/18/2022 Primary Care Physican: Johny Blamer, MD Primary Cardiologist: Shirlee Latch Electrophysiologist: Graciela Husbands 08/09/2021 Weight: 225 lbs 08/27/2021 Weight: 224.4 lbs 11/29/2021 Weight: 230 lbs  12/30/2021 Weight: 230 lbs 03/18/2022 Weight:          Spoke with patient and heart failure questions reviewed.  Pt reports she has not been sleeping well but does not have any fluid symptoms.  She did skip some dosages of Furosemide in the last week due to work schedule.   Corvue thoracic impedance suggesting possible fluid accumulation starting 8/15.     Prescribed:  Furosemide 20 mg Take 1 tablet (20 mg) by mouth every other day.  Potassium 20 mEq take 1 tablet by mouth every other day.   Labs: 09/27/2021 Creatinine 1.21, BUN 11, Potassium 4.3, Sodium 136, GFR 51 08/14/2021 Creatinine 1.15, BUN 20, Potassium 4.4, Sodium 134, GFR 55 08/08/2021 Creatinine 0.85, BUN 10, Potassium 4.3, Sodium 139, GFR >60 08/07/2021 Creatinine 0.78, BUN 8,   Potassium 4.1, Sodium 134, GFR >60 (3:07 PM) A complete set of results can be found in Results Review.   Recommendations:  Copy sent to Dr Shirlee Latch for review and recommendations if needed.  Advised to limit salt.     Follow-up plan: ICM clinic phone appointment on 03/24/2022 to recheck fluid levels.   91 day device clinic remote transmission 04/04/2022.     EP/Cardiology next office visit: Recall 03/26/2022 with [facility] PA/NP.  Recall 07/28/2022 with Dr Graciela Husbands.   Copy of ICM check sent to Dr. Graciela Husbands.      3 month ICM trend: 03/18/2022.    12-14 Month ICM trend:     Karie Soda, RN 03/18/2022 2:11 PM

## 2022-03-18 NOTE — Progress Notes (Signed)
Increase Lasix to 20 mg daily for now, BMET 10 days.

## 2022-03-19 MED ORDER — POTASSIUM CHLORIDE CRYS ER 20 MEQ PO TBCR: 20.0000 meq | EXTENDED_RELEASE_TABLET | Freq: Every day | ORAL | 3 refills | Status: DC

## 2022-03-19 MED ORDER — FUROSEMIDE 20 MG PO TABS
20.0000 mg | ORAL_TABLET | Freq: Every day | ORAL | 3 refills | Status: DC
Start: 2022-03-19 — End: 2022-12-15

## 2022-03-19 NOTE — Progress Notes (Signed)
Spoke with patient and advised Dr Shirlee Latch recommended to take Lasix 20 mg daily and Potassium 20 mEq daily.   She verbalized understanding and agreed with plan.  Updated prescription sent to preferred pharmacy.  BMET scheduled for 9/5 at 8:30 AM at HF clinic.

## 2022-03-19 NOTE — Progress Notes (Signed)
Spoke with patient and provided Dr Alford Highland recommendation to increase Lasix to 20 mg daily and BMET in 10 days.    Pt is asking if she should take Potassium dosage of 20 mEq daily (currently taking every other day) when taking Lasix daily since she has had low potassium levels in the past.  Advised will send to Dr Shirlee Latch to ask what her potassium dosage should be since the Lasix will be increased to daily.    Dr Shirlee Latch, do you want to change Potassium 20 mEq daily (currently taking qod)?

## 2022-03-19 NOTE — Progress Notes (Signed)
Received: Today Alison Morale, MD  Jorden Mahl, Josephine Igo, RN Yes, take 20 KCl daily

## 2022-03-25 ENCOUNTER — Ambulatory Visit (INDEPENDENT_AMBULATORY_CARE_PROVIDER_SITE_OTHER): Payer: BC Managed Care – PPO

## 2022-03-25 DIAGNOSIS — I5022 Chronic systolic (congestive) heart failure: Secondary | ICD-10-CM

## 2022-03-25 DIAGNOSIS — Z95810 Presence of automatic (implantable) cardiac defibrillator: Secondary | ICD-10-CM

## 2022-03-25 NOTE — Progress Notes (Signed)
EPIC Encounter for ICM Monitoring  Patient Name: Alison Howard is a 61 y.o. female Date: 03/25/2022 Primary Care Physican: Johny Blamer, MD Primary Cardiologist: Shirlee Latch Electrophysiologist: Graciela Husbands 08/09/2021 Weight: 225 lbs 08/27/2021 Weight: 224.4 lbs 11/29/2021 Weight: 230 lbs  12/30/2021 Weight: 230 lbs         Spoke with patient and heart failure questions reviewed.  Pt reports she is doing well and voices no complaints.   Corvue thoracic impedance suggesting dryness in response to Furosemide changed to daily but trending back to the baseline normal.     Prescribed:  Furosemide 20 mg Take 1 tablet (20 mg) by mouth daily.  Potassium 20 mEq take 1 tablet by mouth daily.   Labs: 04/01/2022 BMET Scheduled 09/27/2021 Creatinine 1.21, BUN 11, Potassium 4.3, Sodium 136, GFR 51 08/14/2021 Creatinine 1.15, BUN 20, Potassium 4.4, Sodium 134, GFR 55 08/08/2021 Creatinine 0.85, BUN 10, Potassium 4.3, Sodium 139, GFR >60 08/07/2021 Creatinine 0.78, BUN 8,   Potassium 4.1, Sodium 134, GFR >60 (3:07 PM) A complete set of results can be found in Results Review.   Recommendations:  Copy sent to Dr Shirlee Latch for review and recommendations if needed.  Advised to limit salt.     Follow-up plan: ICM clinic phone appointment on 04/21/2022.   91 day device clinic remote transmission 04/04/2022.     EP/Cardiology next office visit: Recall 03/26/2022 with [facility] PA/NP.  Recall 07/28/2022 with Dr Graciela Husbands.   Copy of ICM check sent to Dr. Graciela Husbands.    3 month ICM trend: 03/25/2022.    12-14 Month ICM trend:     Karie Soda, RN 03/25/2022 3:15 PM

## 2022-04-01 ENCOUNTER — Ambulatory Visit (HOSPITAL_COMMUNITY)
Admission: RE | Admit: 2022-04-01 | Discharge: 2022-04-01 | Disposition: A | Discharge status: Home / Self Care | Payer: BC Managed Care – PPO | Source: Ambulatory Visit | Attending: Cardiology | Admitting: Cardiology

## 2022-04-01 DIAGNOSIS — E039 Hypothyroidism, unspecified: Secondary | ICD-10-CM | POA: Insufficient documentation

## 2022-04-01 DIAGNOSIS — I5022 Chronic systolic (congestive) heart failure: Secondary | ICD-10-CM | POA: Insufficient documentation

## 2022-04-01 LAB — TSH: TSH: 23.934 u[IU]/mL — ABNORMAL HIGH (ref 0.350–4.500)

## 2022-04-01 NOTE — Addendum Note (Signed)
Encounter addended by: Jacklynn Ganong, FNP on: 04/01/2022 9:38 AM  Actions taken: Clinical Note Signed

## 2022-04-01 NOTE — Progress Notes (Deleted)
Erroneous encounter

## 2022-04-03 ENCOUNTER — Encounter (HOSPITAL_COMMUNITY): Payer: Self-pay | Admitting: Cardiology

## 2022-04-04 ENCOUNTER — Ambulatory Visit (INDEPENDENT_AMBULATORY_CARE_PROVIDER_SITE_OTHER): Payer: BC Managed Care – PPO

## 2022-04-04 DIAGNOSIS — I5022 Chronic systolic (congestive) heart failure: Secondary | ICD-10-CM | POA: Diagnosis not present

## 2022-04-04 NOTE — Addendum Note (Signed)
Encounter addended by: Jacklynn Ganong, FNP on: 04/04/2022 9:30 AM  Actions taken: Clinical Note Signed, Delete clinical note

## 2022-04-08 DIAGNOSIS — E7800 Pure hypercholesterolemia, unspecified: Secondary | ICD-10-CM | POA: Diagnosis not present

## 2022-04-08 DIAGNOSIS — E039 Hypothyroidism, unspecified: Secondary | ICD-10-CM | POA: Diagnosis not present

## 2022-04-08 LAB — CUP PACEART REMOTE DEVICE CHECK
Battery Remaining Longevity: 46 mo
Battery Remaining Percentage: 50 %
Battery Voltage: 2.95 V
Brady Statistic AP VP Percent: 6.8 %
Brady Statistic AP VS Percent: 1 %
Brady Statistic AS VP Percent: 89 %
Brady Statistic AS VS Percent: 1.1 %
Brady Statistic RA Percent Paced: 3.5 %
Date Time Interrogation Session: 20230908020016
HighPow Impedance: 65 Ohm
HighPow Impedance: 65 Ohm
Implantable Lead Implant Date: 20120529
Implantable Lead Implant Date: 20120529
Implantable Lead Implant Date: 20120529
Implantable Lead Location: 753858
Implantable Lead Location: 753859
Implantable Lead Location: 753860
Implantable Pulse Generator Implant Date: 20190724
Lead Channel Impedance Value: 1200 Ohm
Lead Channel Impedance Value: 490 Ohm
Lead Channel Impedance Value: 550 Ohm
Lead Channel Pacing Threshold Amplitude: 0.75 V
Lead Channel Pacing Threshold Amplitude: 1.25 V
Lead Channel Pacing Threshold Amplitude: 2 V
Lead Channel Pacing Threshold Pulse Width: 0.5 ms
Lead Channel Pacing Threshold Pulse Width: 0.5 ms
Lead Channel Pacing Threshold Pulse Width: 0.5 ms
Lead Channel Sensing Intrinsic Amplitude: 12 mV
Lead Channel Sensing Intrinsic Amplitude: 3.1 mV
Lead Channel Setting Pacing Amplitude: 2 V
Lead Channel Setting Pacing Amplitude: 2.25 V
Lead Channel Setting Pacing Amplitude: 2.5 V
Lead Channel Setting Pacing Pulse Width: 0.5 ms
Lead Channel Setting Pacing Pulse Width: 0.5 ms
Lead Channel Setting Sensing Sensitivity: 0.5 mV
Pulse Gen Serial Number: 9842506

## 2022-04-18 NOTE — Progress Notes (Signed)
Remote ICD transmission.   

## 2022-04-21 ENCOUNTER — Ambulatory Visit (INDEPENDENT_AMBULATORY_CARE_PROVIDER_SITE_OTHER): Payer: BC Managed Care – PPO

## 2022-04-21 DIAGNOSIS — Z95810 Presence of automatic (implantable) cardiac defibrillator: Secondary | ICD-10-CM

## 2022-04-21 DIAGNOSIS — I5022 Chronic systolic (congestive) heart failure: Secondary | ICD-10-CM

## 2022-04-23 NOTE — Progress Notes (Signed)
EPIC Encounter for ICM Monitoring  Patient Name: Alison Howard is a 61 y.o. female Date: 04/23/2022 Primary Care Physican: Shirline Frees, MD Primary Cardiologist: Aundra Dubin Electrophysiologist: Caryl Comes 12/30/2021 Weight: 230 lbs 04/23/2022 Weight: 240 lbs         Spoke with patient and heart failure questions reviewed.  Pt reports she is doing well and has no fluid symptoms but does have some side effects from Thyroid medication.    Corvue thoracic impedance suggesting intermittent days with possible fluid accumulation.       Prescribed:  Furosemide 20 mg Take 1 tablet (20 mg) by mouth daily.  Potassium 20 mEq take 1 tablet by mouth daily.   Labs: 09/27/2021 Creatinine 1.21, BUN 11, Potassium 4.3, Sodium 136, GFR 51 08/14/2021 Creatinine 1.15, BUN 20, Potassium 4.4, Sodium 134, GFR 55 08/08/2021 Creatinine 0.85, BUN 10, Potassium 4.3, Sodium 139, GFR >60 08/07/2021 Creatinine 0.78, BUN 8,   Potassium 4.1, Sodium 134, GFR >60 (3:07 PM) A complete set of results can be found in Results Review.   Recommendations:  No changes and encouraged to call if experiencing any fluid symptoms.   Follow-up plan: ICM clinic phone appointment on 05/26/2022.   91 day device clinic remote transmission 07/04/2022.     EP/Cardiology next office visit: Recall 03/26/2022 with [facility] PA/NP.  Recall 07/28/2022 with Dr Caryl Comes.   Copy of ICM check sent to Dr. Caryl Comes.    3 month ICM trend: 04/21/2022.    12-14 Month ICM trend:     [name], RN 04/23/2022 5:12 PM

## 2022-04-26 ENCOUNTER — Other Ambulatory Visit: Payer: Self-pay | Admitting: Cardiology

## 2022-05-26 ENCOUNTER — Ambulatory Visit (INDEPENDENT_AMBULATORY_CARE_PROVIDER_SITE_OTHER): Payer: BC Managed Care – PPO

## 2022-05-26 DIAGNOSIS — Z95810 Presence of automatic (implantable) cardiac defibrillator: Secondary | ICD-10-CM

## 2022-05-26 DIAGNOSIS — I5022 Chronic systolic (congestive) heart failure: Secondary | ICD-10-CM | POA: Diagnosis not present

## 2022-05-27 ENCOUNTER — Telehealth: Payer: Self-pay

## 2022-05-27 NOTE — Telephone Encounter (Signed)
Remote ICM transmission received.  Attempted call to patient regarding ICM remote transmission and left detailed message per DPR.  Advised to return call for any fluid symptoms or questions. Next ICM remote transmission scheduled 06/30/2022.    

## 2022-05-27 NOTE — Progress Notes (Signed)
EPIC Encounter for ICM Monitoring  Patient Name: Alison Howard is a 61 y.o. female Date: 05/27/2022 Primary Care Physican: Shirline Frees, MD Primary Cardiologist: Aundra Dubin Electrophysiologist: Caryl Comes 12/30/2021 Weight: 230 lbs 04/23/2022 Weight: 240 lbs         Attempted call to patient and unable to reach.  Left detailed message per DPR regarding transmission. Transmission reviewed.    Corvue thoracic impedance normal suggesting possible fluid accumulation from 10/9-10/16   Prescribed:  Furosemide 20 mg Take 1 tablet (20 mg) by mouth daily.  Potassium 20 mEq take 1 tablet by mouth daily.   Labs: 09/27/2021 Creatinine 1.21, BUN 11, Potassium 4.3, Sodium 136, GFR 51 08/14/2021 Creatinine 1.15, BUN 20, Potassium 4.4, Sodium 134, GFR 55 08/08/2021 Creatinine 0.85, BUN 10, Potassium 4.3, Sodium 139, GFR >60 08/07/2021 Creatinine 0.78, BUN 8,   Potassium 4.1, Sodium 134, GFR >60 (3:07 PM) A complete set of results can be found in Results Review.   Recommendations:  Left voice mail with ICM number and encouraged to call if experiencing any fluid symptoms.   Follow-up plan: ICM clinic phone appointment on 06/30/2022.   91 day device clinic remote transmission 07/04/2022.     EP/Cardiology next office visit: Recall 03/26/2022 with [facility] PA/NP.  Recall 07/28/2022 with Dr Caryl Comes.   Copy of ICM check sent to Dr. Caryl Comes.    3 month ICM trend: 05/26/2022.    12-14 Month ICM trend:     [name], RN 05/27/2022 2:31 PM

## 2022-06-16 DIAGNOSIS — Z23 Encounter for immunization: Secondary | ICD-10-CM | POA: Diagnosis not present

## 2022-06-16 DIAGNOSIS — E039 Hypothyroidism, unspecified: Secondary | ICD-10-CM | POA: Diagnosis not present

## 2022-06-30 ENCOUNTER — Ambulatory Visit (INDEPENDENT_AMBULATORY_CARE_PROVIDER_SITE_OTHER): Payer: BC Managed Care – PPO

## 2022-06-30 DIAGNOSIS — Z95810 Presence of automatic (implantable) cardiac defibrillator: Secondary | ICD-10-CM

## 2022-06-30 DIAGNOSIS — I5022 Chronic systolic (congestive) heart failure: Secondary | ICD-10-CM

## 2022-07-02 ENCOUNTER — Telehealth: Payer: Self-pay

## 2022-07-02 NOTE — Progress Notes (Signed)
EPIC Encounter for ICM Monitoring  Patient Name: Alison Howard is a 61 y.o. female Date: 07/02/2022 Primary Care Physican: Johny Blamer, MD Primary Cardiologist: Shirlee Latch Electrophysiologist: Graciela Husbands 12/30/2021 Weight: 230 lbs 04/23/2022 Weight: 240 lbs         Attempted call to patient and unable to reach.  Left detailed message per DPR regarding transmission. Transmission reviewed.    Corvue thoracic impedance normal suggesting possible fluid accumulation from 11/26 and returned to normal 12/3.   Prescribed:  Furosemide 20 mg Take 1 tablet (20 mg) by mouth daily.  Potassium 20 mEq take 1 tablet by mouth daily.   Labs: 09/27/2021 Creatinine 1.21, BUN 11, Potassium 4.3, Sodium 136, GFR 51 08/14/2021 Creatinine 1.15, BUN 20, Potassium 4.4, Sodium 134, GFR 55 08/08/2021 Creatinine 0.85, BUN 10, Potassium 4.3, Sodium 139, GFR >60 08/07/2021 Creatinine 0.78, BUN 8,   Potassium 4.1, Sodium 134, GFR >60 (3:07 PM) A complete set of results can be found in Results Review.   Recommendations:  Left voice mail with ICM number and encouraged to call if experiencing any fluid symptoms.   Follow-up plan: ICM clinic phone appointment on 08/11/2022.   91 day device clinic remote transmission 10/03/2022.     EP/Cardiology next office visit: Recall 03/26/2022 with [facility] PA/NP.  Recall 07/28/2022 with Dr Graciela Husbands.   Copy of ICM check sent to Dr. Graciela Husbands.    3 month ICM trend: 06/30/2022.    12-14 Month ICM trend:     Karie Soda, RN 07/02/2022 2:54 PM

## 2022-07-02 NOTE — Telephone Encounter (Signed)
Remote ICM transmission received.  Attempted call to patient regarding ICM remote transmission and left detailed message per DPR.  Advised to return call for any fluid symptoms or questions. Next ICM remote transmission scheduled 08/11/2022.    

## 2022-07-04 ENCOUNTER — Ambulatory Visit (INDEPENDENT_AMBULATORY_CARE_PROVIDER_SITE_OTHER): Payer: BC Managed Care – PPO

## 2022-07-04 DIAGNOSIS — I429 Cardiomyopathy, unspecified: Secondary | ICD-10-CM

## 2022-07-04 LAB — CUP PACEART REMOTE DEVICE CHECK
Battery Remaining Longevity: 43 mo
Battery Remaining Percentage: 47 %
Battery Voltage: 2.93 V
Brady Statistic AP VP Percent: 6.9 %
Brady Statistic AP VS Percent: 1 %
Brady Statistic AS VP Percent: 88 %
Brady Statistic AS VS Percent: 1.3 %
Brady Statistic RA Percent Paced: 3.3 %
Date Time Interrogation Session: 20231208020029
HighPow Impedance: 64 Ohm
HighPow Impedance: 65 Ohm
Implantable Lead Connection Status: 753985
Implantable Lead Connection Status: 753985
Implantable Lead Connection Status: 753985
Implantable Lead Implant Date: 20120529
Implantable Lead Implant Date: 20120529
Implantable Lead Implant Date: 20120529
Implantable Lead Location: 753858
Implantable Lead Location: 753859
Implantable Lead Location: 753860
Implantable Pulse Generator Implant Date: 20190724
Lead Channel Impedance Value: 1225 Ohm
Lead Channel Impedance Value: 460 Ohm
Lead Channel Impedance Value: 540 Ohm
Lead Channel Pacing Threshold Amplitude: 0.75 V
Lead Channel Pacing Threshold Amplitude: 1.25 V
Lead Channel Pacing Threshold Amplitude: 1.75 V
Lead Channel Pacing Threshold Pulse Width: 0.5 ms
Lead Channel Pacing Threshold Pulse Width: 0.5 ms
Lead Channel Pacing Threshold Pulse Width: 0.5 ms
Lead Channel Sensing Intrinsic Amplitude: 12 mV
Lead Channel Sensing Intrinsic Amplitude: 3.3 mV
Lead Channel Setting Pacing Amplitude: 2 V
Lead Channel Setting Pacing Amplitude: 2.25 V
Lead Channel Setting Pacing Amplitude: 2.25 V
Lead Channel Setting Pacing Pulse Width: 0.5 ms
Lead Channel Setting Pacing Pulse Width: 0.5 ms
Lead Channel Setting Sensing Sensitivity: 0.5 mV
Pulse Gen Serial Number: 9842506

## 2022-07-07 DIAGNOSIS — E039 Hypothyroidism, unspecified: Secondary | ICD-10-CM | POA: Diagnosis not present

## 2022-07-07 DIAGNOSIS — I5022 Chronic systolic (congestive) heart failure: Secondary | ICD-10-CM | POA: Diagnosis not present

## 2022-07-08 ENCOUNTER — Other Ambulatory Visit (HOSPITAL_COMMUNITY): Payer: Self-pay | Admitting: Cardiology

## 2022-07-16 ENCOUNTER — Other Ambulatory Visit (HOSPITAL_COMMUNITY): Payer: Self-pay | Admitting: Family Medicine

## 2022-07-16 ENCOUNTER — Other Ambulatory Visit: Payer: Self-pay | Admitting: Cardiology

## 2022-07-25 NOTE — Progress Notes (Signed)
Remote ICD transmission.   

## 2022-07-28 ENCOUNTER — Other Ambulatory Visit: Payer: Self-pay | Admitting: Family Medicine

## 2022-08-11 ENCOUNTER — Ambulatory Visit (INDEPENDENT_AMBULATORY_CARE_PROVIDER_SITE_OTHER): Payer: BC Managed Care – PPO

## 2022-08-11 DIAGNOSIS — I5022 Chronic systolic (congestive) heart failure: Secondary | ICD-10-CM | POA: Diagnosis not present

## 2022-08-11 DIAGNOSIS — Z95810 Presence of automatic (implantable) cardiac defibrillator: Secondary | ICD-10-CM

## 2022-08-14 NOTE — Progress Notes (Signed)
EPIC Encounter for ICM Monitoring  Patient Name: Alison Howard is a 62 y.o. female Date: 08/14/2022 Primary Care Physican: Shirline Frees, MD Primary Cardiologist: Aundra Dubin Electrophysiologist: Caryl Comes BiV Pacing: 95% 12/30/2021 Weight: 230 lbs 04/23/2022 Weight: 240 lbs         Spoke with patient and heart failure questions reviewed.  Transmission results reviewed.  Pt asymptomatic for fluid accumulation.  Reports feeling well at this time and voices no complaints.      Corvue thoracic impedance normal but was suggesting possible fluid accumulation from 1/10-1/14.   Prescribed:  Furosemide 20 mg Take 1 tablet (20 mg) by mouth daily.  Potassium 20 mEq take 1 tablet by mouth daily.   Labs: 09/27/2021 Creatinine 1.21, BUN 11, Potassium 4.3, Sodium 136, GFR 51 08/14/2021 Creatinine 1.15, BUN 20, Potassium 4.4, Sodium 134, GFR 55 08/08/2021 Creatinine 0.85, BUN 10, Potassium 4.3, Sodium 139, GFR >60 08/07/2021 Creatinine 0.78, BUN 8,   Potassium 4.1, Sodium 134, GFR >60 (3:07 PM) A complete set of results can be found in Results Review.   Recommendations:  No changes and encouraged to call if experiencing any fluid symptoms.   Follow-up plan: ICM clinic phone appointment on 09/16/2022.   91 day device clinic remote transmission 10/03/2022.     EP/Cardiology next office visit:  Advised to call Dr Claris Gladden and Dr Olin Pia office for appointments.  Recall 03/26/2022 with [facility] PA/NP.  Recall 07/28/2022 with Dr Caryl Comes.   Copy of ICM check sent to Dr. Caryl Comes.    3 month ICM trend: 08/11/2022.    12-14 Month ICM trend:     [name], RN 08/14/2022 10:15 AM

## 2022-08-15 DIAGNOSIS — E038 Other specified hypothyroidism: Secondary | ICD-10-CM | POA: Diagnosis not present

## 2022-08-18 ENCOUNTER — Telehealth: Payer: Self-pay

## 2022-08-18 ENCOUNTER — Ambulatory Visit: Payer: BC Managed Care – PPO | Attending: Internal Medicine | Admitting: Internal Medicine

## 2022-08-18 ENCOUNTER — Encounter: Payer: Self-pay | Admitting: Internal Medicine

## 2022-08-18 ENCOUNTER — Telehealth (HOSPITAL_COMMUNITY): Payer: Self-pay

## 2022-08-18 VITALS — BP 124/86 | HR 70 | Ht 62.0 in

## 2022-08-18 DIAGNOSIS — Z95810 Presence of automatic (implantable) cardiac defibrillator: Secondary | ICD-10-CM | POA: Diagnosis not present

## 2022-08-18 DIAGNOSIS — I493 Ventricular premature depolarization: Secondary | ICD-10-CM | POA: Diagnosis not present

## 2022-08-18 DIAGNOSIS — I5022 Chronic systolic (congestive) heart failure: Secondary | ICD-10-CM

## 2022-08-18 NOTE — Telephone Encounter (Signed)
  Patient Consent for Virtual Visit        Alison Howard has provided verbal consent on 08/18/2022 for a virtual visit (video or telephone).   CONSENT FOR VIRTUAL VISIT FOR:  Alison Howard  By participating in this virtual visit I agree to the following:  I hereby voluntarily request, consent and authorize [facility] and its employed or contracted physicians, physician assistants, nurse practitioners or other licensed health care professionals (the Practitioner), to provide me with telemedicine health care services (the "Services") as deemed necessary by the treating Practitioner. I acknowledge and consent to receive the Services by the Practitioner via telemedicine. I understand that the telemedicine visit will involve communicating with the Practitioner through live audiovisual communication technology and the disclosure of certain medical information by electronic transmission. I acknowledge that I have been given the opportunity to request an in-person assessment or other available alternative prior to the telemedicine visit and am voluntarily participating in the telemedicine visit.  I understand that I have the right to withhold or withdraw my consent to the use of telemedicine in the course of my care at any time, without affecting my right to future care or treatment, and that the Practitioner or I may terminate the telemedicine visit at any time. I understand that I have the right to inspect all information obtained and/or recorded in the course of the telemedicine visit and may receive copies of available information for a reasonable fee.  I understand that some of the potential risks of receiving the Services via telemedicine include:  Delay or interruption in medical evaluation due to technological equipment failure or disruption; Information transmitted may not be sufficient (e.g. poor resolution of images) to allow for appropriate medical decision making by the Practitioner;  and/or  In rare instances, security protocols could fail, causing a breach of personal health information.  Furthermore, I acknowledge that it is my responsibility to provide information about my medical history, conditions and care that is complete and accurate to the best of my ability. I acknowledge that Practitioner's advice, recommendations, and/or decision may be based on factors not within their control, such as incomplete or inaccurate data provided by me or distortions of diagnostic images or specimens that may result from electronic transmissions. I understand that the practice of medicine is not an exact science and that Practitioner makes no warranties or guarantees regarding treatment outcomes. I acknowledge that a copy of this consent can be made available to me via my patient portal ([facility]), or I can request a printed copy by calling the office of [facility].    I understand that my insurance will be billed for this visit.   I have read or had this consent read to me. I understand the contents of this consent, which adequately explains the benefits and risks of the Services being provided via telemedicine.  I have been provided ample opportunity to ask questions regarding this consent and the Services and have had my questions answered to my satisfaction. I give my informed consent for the services to be provided through the use of telemedicine in my medical care

## 2022-08-18 NOTE — Progress Notes (Signed)
Electrophysiology TeleHealth Note   Due to national recommendations of social distancing due to COVID 19, an audio/video telehealth visit is felt to be most appropriate for this patient at this time.  See MyChart message from today for the patient's consent to telehealth for [facility].   Date:  08/18/2022   ID:  Alison Howard, DOB 1960-10-05, MRN [id number]  Location: patient's home  Provider location: [address], [place]  Evaluation Performed: Follow-up visit  PCP:  Johny Blamer, MD  Cardiologist:     Electrophysiologist:  SK   Chief Complaint:  VT  History of Present Illness:    Alison Howard is a 62 y.o. female who presents via audio/video conferencing for a telehealth visit today.  Since last being seen in our clinic for CRT-D [employer] 2013 [facility]) gen change 2019 and recurrent VT, previously Flecainide was discontinued placed on amiodarone; underwent EP study with GT ablation was unable to impact the ventricular tachycardia, the patient reports no  chest pain, shortness of breath, nocturnal dyspnea, orthopnea or peripheral edema.  There have been no palpitations, lightheadedness or syncope.    No intercurrent VT --off amiodarone  Interval hypothyroidism but on therapy          DATE TEST EF%    8/13  Echo  40-45%    12/15    Echo   25-30 %    6/17    Echo   35%    6/18 Echo   40-45%    11/18 Echo  35-40%    12/19 Echo 25-30%    6/20 Cardiac MRI      5/22 Echo  35-40%      DATE PVC %    8/17 11 Multiple morphologies  6/18 22  Multiple morphologies  10/18 5.2 From device   7/19 >10%    6/22 2.6% From device  12/23 3.8%     Date Cr K Hgb TSH LFT  5/17  0.8 4.1        6/18  0.78 4.2        10/19 0.88 4.3        10/20 1.00 4.3        4/21 0.96 4.0         5/22 0.92 4.0        1/23 1.21 4.3   11.321 11  9/23    24   1/24    5         Past Medical History:  Diagnosis Date   CHF (congestive heart failure) (HCC)    Chronic  systolic heart failure (HCC) 04/25/2013   GERD (gastroesophageal reflux disease)    Hypercholesteremia    LBBB (left bundle branch block)    Morbid obesity (HCC) 12/28/2014   NICM (nonischemic cardiomyopathy) (HCC)    a. s/p STJ CRTD   OSA (obstructive sleep apnea) 12/28/2014   Severe with AHI 27 events per hour on average and the highest AHI was 40 events per hour   Ventricular tachycardia (HCC)    a. s/p appropriate ICD therapy    Past Surgical History:  Procedure Laterality Date   BIV ICD GENERATOR CHANGEOUT N/A 02/17/2018   Procedure: BIV ICD GENERATOR CHANGEOUT;  Surgeon: Duke Salvia, MD;  Location: [facility] INVASIVE CV LAB;  Service: Cardiovascular;  Laterality: N/A;   CARDIAC DEFIBRILLATOR PLACEMENT  2012   a. STJ CRTD implanted for NICM, CHF   RIGHT/LEFT HEART CATH AND CORONARY ANGIOGRAPHY N/A 10/07/2018  Procedure: RIGHT/LEFT HEART CATH AND CORONARY ANGIOGRAPHY;  Surgeon: Larey Dresser, MD;  Location: [place] CV LAB;  Service: Cardiovascular;  Laterality: N/A;   TOTAL ABDOMINAL HYSTERECTOMY     V TACH ABLATION N/A 10/01/2021   Procedure: V TACH ABLATION;  Surgeon: Evans Lance, MD;  Location: [place] CV LAB;  Service: Cardiovascular;  Laterality: N/A;    Current Outpatient Medications  Medication Sig Dispense Refill   bisoprolol (ZEBETA) 5 MG tablet TAKE 1 TABLET BY MOUTH TWICE A DAY 180 tablet 3   citalopram (CELEXA) 40 MG tablet Take 40 mg by mouth daily.      ENTRESTO 24-26 MG TAKE 1 TABLET BY MOUTH TWICE A DAY 180 tablet 3   eplerenone (INSPRA) 50 MG tablet TAKE 1 TABLET BY MOUTH EVERY DAY 90 tablet 2   furosemide (LASIX) 20 MG tablet Take 1 tablet (20 mg total) by mouth daily. 90 tablet 3   levothyroxine (SYNTHROID) 50 MCG tablet 1 tablet in the morning on an empty stomach Orally Once a day alternating with 75 mcg every other day for 90 days     levothyroxine (SYNTHROID) 75 MCG tablet 1 tablet in the morning on an empty stomach Orally Every other day alternating  with levothyroxine 50 mcg     LORazepam (ATIVAN) 0.5 MG tablet Take 0.5 mg by mouth 2 (two) times daily as needed for anxiety.     magnesium oxide (MAG-OX) 400 (240 Mg) MG tablet TAKE 1 TABLET BY MOUTH TWICE A DAY 180 tablet 2   pantoprazole (PROTONIX) 40 MG tablet Take 40 mg by mouth 2 (two) times daily.      potassium chloride SA (KLOR-CON M) 20 MEQ tablet Take 1 tablet (20 mEq total) by mouth daily. 90 tablet 3   pravastatin (PRAVACHOL) 80 MG tablet TAKE UP TO 1 TABLET DAILY AS TOLERATED 90 tablet 1   No current facility-administered medications for this visit.    Allergies:   Ciprofloxacin hcl, Codeine, Morphine and related, Sulfa antibiotics, Zetia [ezetimibe], Atorvastatin, Dapagliflozin, Rosuvastatin, Simvastatin, and Welchol [colesevelam]      ROS:  Please see the history of present illness.   All other systems are personally reviewed and negative.    Exam:    Vital Signs:  BP 124/86 Comment: pt provided  Pulse 70 Comment: pt provided  Ht 5\' 2"  (1.575 m)   BMI 42.43 kg/m   Labs/Other Tests and Data Reviewed:    Recent Labs: 08/26/2021: ALT 11 09/27/2021: B Natriuretic Peptide 181.0; BUN 11; Creatinine, Ser 1.21; Hemoglobin 14.2; Platelets 368; Potassium 4.3; Sodium 136 04/01/2022: TSH 23.934   Wt Readings from Last 3 Encounters:  01/29/22 232 lb (105.2 kg)  11/06/21 232 lb 9.6 oz (105.5 kg)  10/31/21 229 lb 9.6 oz (104.1 kg)     Other studies personally reviewed: Additional studies/ records that were reviewed today include: (As above)   Review of the above records today demonstrates: (As above)     Last device remote is reviewed from [place] PDF dated 12/23 which reveals normal device function,   arrhythmias - none  {PVC 4%    ASSESSMENT & PLAN:    Nonischemic cardiomyopathy   Congestive heart failure- chronic-systolic   Ventricular tachycardia   Hypothyroidism   PVCs   Morbidly obese    Doing relatively well--functional status stable  walking 5 miles a  week  Hypothryoidism better with PCP  Euvolemic  BP well controlled   continue current meds\  No interval VT  PVC  about 4 %  may be underestimate as from device But not currently an issue--EF did not improve with control of PVCs    Follow-up:  16m  Next remote: As Scheduled   Current medicines are reviewed at length with the patient today.   The patient does not have concerns regarding her medicines.  The following changes were made today:  none  Labs/ tests ordered today include:   No orders of the defined types were placed in this encounter.   Future tests       Today, I have spent 21 minutes with the patient with telehealth technology discussing the above.  Signed, Virl Axe, MD  08/18/2022 11:24 AM     [place] [address] [phone] (office) [phone] (fax)

## 2022-08-18 NOTE — Telephone Encounter (Signed)
As per Deatra Robinson 's request follow up appointment made for February 2 @10 :30 with app clinic

## 2022-08-18 NOTE — Patient Instructions (Signed)
Medication Instructions:  Your physician recommends that you continue on your current medications as directed. Please refer to the Current Medication list given to you today.  *If you need a refill on your cardiac medications before your next appointment, please call your pharmacy*   Lab Work: None ordered.  If you have labs (blood work) drawn today and your tests are completely normal, you will receive your results only by: MyChart Message (if you have MyChart) OR A paper copy in the mail If you have any lab test that is abnormal or we need to change your treatment, we will call you to review the results.   Testing/Procedures: None ordered.    Follow-Up: At [facility], you and your health needs are our priority.  As part of our continuing mission to provide you with exceptional heart care, we have created designated Provider Care Teams.  These Care Teams include your primary Cardiologist (physician) and Advanced Practice Providers (APPs -  Physician Assistants and Nurse Practitioners) who all work together to provide you with the care you need, when you need it.  We recommend signing up for the patient portal called "MyChart".  Sign up information is provided on this After Visit Summary.  MyChart is used to connect with patients for Virtual Visits (Telemedicine).  Patients are able to view lab/test results, encounter notes, upcoming appointments, etc.  Non-urgent messages can be sent to your provider as well.   To learn more about what you can do with MyChart, go to [email or web address].    Your next appointment:   6 months with Dr Klein 

## 2022-08-27 NOTE — Progress Notes (Signed)
ID:  Alison Howard, DOB March 19, 1961, MRN [id number]   Provider location: [place] [facility] Type of Visit: Established patient  PCP:  Shirline Frees, MD  HF Cardiologist:  Dr. Aundra Dubin   HPI: Alison Howard is a 62 y.o. female who has a history of CHF and VT and was referred by Dr. Caryl Comes for evaluation of CHF. She has a long history of cardiomyopathy, dating back to at least 2013.  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  She has had VT in the past terminated by [employer].  She has also had up to 22% PVCs in the past, down to 3.1% on 10/19 holter. She is currently on flecainide to suppress the PVCs.  Echo in 12/19 showed severe dilation of the LV with EF 25-30%.    RHC/LHC in 3/20 showed normal filling pressures, CI 2.37, and no significant CAD. CPX in 3/20 showed no significant HF limitation, main problem appeared to be deconditioning.  Echo in 5/22 showed EF 35-40% with basal inferoseptal, basal anteroseptal, and basal inferior akinesis.   She was admitted in 1/23 with CHF and runs of NSVT.  ICD did not discharge. She was diuresed and flecainide was stopped and replaced with amiodarone. No further VT.   Follow up 3/23, BP on low side and Entresto reduced, ReDs 26%.   S/p VT ablation, unfortunately unsuccessful. Remained off amio and fleccanide.   Today she returns for HF follow up. Overall feeling fine. She has SOB with excessive activity, like cleaning more than 1 room in her home. Denies palpitations, CP, dizziness, edema, or PND/Orthopnea. Appetite ok. No fever or chills. Weight at home 240 pounds. Taking all medications.   St Jude device interrogation: unable to interrogate device today   Labs (3/20): K 4.3, creatinine 0.88 Labs (6/20): K 4.4, creatinine 1.02 Labs (11/20): K 4.5 creatinine 0.88, hgb 14.4 Labs (9/21): K 4, creatinine 0.96, LDL 185, HDL 70 Labs (2/22): LDL 173, K 4.2, creatinine 0.86 Labs (1/23): K 4.3, creatinine 0.85, TSH and free T4 elevated Labs (3/23): K 4.3,  creatinine 1.21 Labs (12/23): K 4.2, creatinine 1.0  ECG (personally reviewed): NSR with 1 PVC, BiV pacing  PMH:  1. Chronic systolic CHF: Nonischemic cardiomyopathy. St Jude BiV ICD.  - Echo (8/13): EF 40-45% - Echo (12/15): EF 25-30% - Echo (6/17): EF 35% - Echo (6/18): EF 40-45% - Echo (11/18): EF 35-40% - Echo (12/19): EF 25-30%, severe LV dilation, mild MR.  - RHC/LHC (3/20): no CAD.  Mean RA 4, PA 27/10 mean 16, mean PCWP 5, CI 2.37.  - CPX (3/20): peak VO2 16.9 (97% predicted), VE/VCO2 23, RER 1.14 => no significant HF limitation, primarily limited by deconditioning.  - Cardiac MRI (6/20): Very difficult study due to pacemaker artifact.  EF 34%, delayed enhancement images not interpretable.  - Echo (5/22): EF 35-40% with basal inferoseptal, basal anteroseptal, and basal inferior akinesis.  2. PVCs/VT:  - 6/18 holter 22% PVCs - 10/19 holter 3.1% PVCs - s/p unsuccessful VT ablation 3/23. 3. OSA 4. Hyperlipidemia: Unable to tolerate statins, Zetia.  5. Obesity  Current Outpatient Medications  Medication Sig Dispense Refill   bisoprolol (ZEBETA) 5 MG tablet TAKE 1 TABLET BY MOUTH TWICE A DAY 180 tablet 3   citalopram (CELEXA) 40 MG tablet Take 40 mg by mouth daily.      ENTRESTO 24-26 MG TAKE 1 TABLET BY MOUTH TWICE A DAY 180 tablet 3   eplerenone (INSPRA) 50 MG tablet TAKE  1 TABLET BY MOUTH EVERY DAY 90 tablet 2   furosemide (LASIX) 20 MG tablet Take 1 tablet (20 mg total) by mouth daily. 90 tablet 3   levothyroxine (SYNTHROID) 50 MCG tablet 1 tablet in the morning on an empty stomach Orally Once a day alternating with 75 mcg every other day for 90 days     levothyroxine (SYNTHROID) 75 MCG tablet 1 tablet in the morning on an empty stomach Orally Every other day alternating with levothyroxine 50 mcg     LORazepam (ATIVAN) 0.5 MG tablet Take 0.5 mg by mouth 2 (two) times daily as needed for anxiety.     magnesium oxide (MAG-OX) 400 (240 Mg) MG tablet TAKE 1 TABLET BY MOUTH TWICE  A DAY 180 tablet 2   pantoprazole (PROTONIX) 40 MG tablet Take 40 mg by mouth 2 (two) times daily.      pravastatin (PRAVACHOL) 80 MG tablet TAKE UP TO 1 TABLET DAILY AS TOLERATED 90 tablet 1   potassium chloride SA (KLOR-CON M) 20 MEQ tablet Take 1 tablet (20 mEq total) by mouth daily. 90 tablet 3   No current facility-administered medications for this encounter.   Allergies:   Ciprofloxacin hcl, Codeine, Morphine and related, Sulfa antibiotics, Zetia [ezetimibe], Atorvastatin, Dapagliflozin, Rosuvastatin, Simvastatin, and Welchol [colesevelam]   Social History:  The patient  reports that she has never smoked. She has never used smokeless tobacco. She reports current alcohol use. She reports that she does not use drugs.   Family History:  The patient's family history includes Coronary artery disease in her mother; Heart attack in her mother; Heart disease in her father; Hyperlipidemia in her sister.   ROS:  Please see the history of present illness.   All other systems are personally reviewed and negative.   Exam:   BP 130/82   Pulse 77   Wt 109.3 kg (241 lb)   SpO2 97%   BMI 44.08 kg/m  General:  NAD. No resp difficulty, walked into clinic HEENT: Normal Neck: Supple. No JVD. Carotids 2+ bilat; no bruits. No lymphadenopathy or thryomegaly appreciated. Cor: PMI nondisplaced. Regular rate & rhythm. No rubs, gallops or murmurs. Lungs: Clear Abdomen: Soft, obese, nontender, nondistended. No hepatosplenomegaly. No bruits or masses. Good bowel sounds. Extremities: No cyanosis, clubbing, rash, edema Neuro: Alert & oriented x 3, cranial nerves grossly intact. Moves all 4 extremities w/o difficulty. Affect pleasant.  Recent Labs: 09/27/2021: B Natriuretic Peptide 181.0; BUN 11; Creatinine, Ser 1.21; Hemoglobin 14.2; Platelets 368; Potassium 4.3; Sodium 136 04/01/2022: TSH 23.934  Personally reviewed   Wt Readings from Last 3 Encounters:  08/29/22 109.3 kg (241 lb)  01/29/22 105.2 kg (232  lb)  11/06/21 105.5 kg (232 lb 9.6 oz)   ASSESSMENT AND PLAN: 1. Chronic systolic CHF: Long history of nonischemic cardiomyopathy.  Most recent echo in 10/19 with severe LV dilation and EF 25-30%.  Has had frequent PVCs in the past, but most recent holter in 10/19 showed PVC count down to 3.1% on flecainide.  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 RHC/LHC in 3/20 with no significant CAD, normal feeling pressures, and preserved cardiac output.  CPX showed deconditioning but no significant HF limitation. Cardiac MRI in 6/20 was a very difficult study due to pacemaker artifact, LV EF 34% but LGE was not interpretable.  Echo in 5/22 showed EF 35-40% with basal inferoseptal, basal anteroseptal, and basal inferior akinesis.  NYHA class II symptoms, not volume overloaded by exam. Recent CorVue (08/11/22) showed stable volume. -  Continue Entresto 24/26 mg bid (orthostatic symptoms on higher doses). BMET and BNP today. - Continue bisoprolol 5 mg bid.  - Continue eplerenone 50 mg daily.  - Continue Lasix 20 mg daily + 20 KCL daily. - She was unable to tolerate dapagliflozin due to itching, and her insurance does not cover empagliflozin.  - Update echo next visit to ensure EF stability. 2. PVCs: Count down to 3.1% with holter in 10/19 but EF remained 25-30% by echo. Suspect not primarily a PVC-mediated CMP therefore.  PVCs likely are a consequence of the cardiomyopathy.  She had symptomatic NSVT runs in 1/23 and flecainide was stopped, amiodarone started.  No palpitations.  - Now, s/p unsuccessful VT ablation 3/23. EP in contact with Dr. Noralee Stain for consideration of re-attempt. - Remains off amiodarone. 3. Obesity: Body mass index is 44.08 kg/m. Continue efforts at diet/exercise for weight loss.  - Will not refer for semaglutide as she does not want to use injectable meds. 4. Hyperlipidemia: Markedly high LDL 153. She will not use any injectable meds.   She is on pravastatin 80 mg daily.   - Lipid Clinic Pharmacist  has discussed further options with her.  - She prefers to stay on her pravastatin, cannot tolerate Zetia. 5. OSA: Unable to tolerate CPAP.  Follow up in 4 months with Dr. Aundra Dubin + echo.  Signed, Rafael Bihari, FNP  08/29/2022  [facility] [address] [facility] [place] [address] [phone] (office) [phone] (fax)

## 2022-08-29 ENCOUNTER — Ambulatory Visit (HOSPITAL_COMMUNITY)
Admission: RE | Admit: 2022-08-29 | Discharge: 2022-08-29 | Disposition: A | Discharge status: Home / Self Care | Payer: BC Managed Care – PPO | Source: Ambulatory Visit | Attending: Family Medicine | Admitting: Family Medicine

## 2022-08-29 ENCOUNTER — Encounter (HOSPITAL_COMMUNITY): Payer: Self-pay

## 2022-08-29 VITALS — BP 130/82 | HR 77 | Wt 241.0 lb

## 2022-08-29 DIAGNOSIS — E669 Obesity, unspecified: Secondary | ICD-10-CM | POA: Insufficient documentation

## 2022-08-29 DIAGNOSIS — Z79899 Other long term (current) drug therapy: Secondary | ICD-10-CM | POA: Insufficient documentation

## 2022-08-29 DIAGNOSIS — E782 Mixed hyperlipidemia: Secondary | ICD-10-CM | POA: Diagnosis not present

## 2022-08-29 DIAGNOSIS — Z950 Presence of cardiac pacemaker: Secondary | ICD-10-CM | POA: Diagnosis not present

## 2022-08-29 DIAGNOSIS — E785 Hyperlipidemia, unspecified: Secondary | ICD-10-CM | POA: Diagnosis not present

## 2022-08-29 DIAGNOSIS — I5022 Chronic systolic (congestive) heart failure: Secondary | ICD-10-CM

## 2022-08-29 DIAGNOSIS — I493 Ventricular premature depolarization: Secondary | ICD-10-CM | POA: Diagnosis not present

## 2022-08-29 DIAGNOSIS — I428 Other cardiomyopathies: Secondary | ICD-10-CM | POA: Diagnosis not present

## 2022-08-29 DIAGNOSIS — Z6841 Body Mass Index (BMI) 40.0 and over, adult: Secondary | ICD-10-CM | POA: Insufficient documentation

## 2022-08-29 DIAGNOSIS — G4733 Obstructive sleep apnea (adult) (pediatric): Secondary | ICD-10-CM

## 2022-08-29 LAB — BASIC METABOLIC PANEL
Anion gap: 11 (ref 5–15)
BUN: 18 mg/dL (ref 8–23)
CO2: 24 mmol/L (ref 22–32)
Calcium: 9.1 mg/dL (ref 8.9–10.3)
Chloride: 99 mmol/L (ref 98–111)
Creatinine, Ser: 1.17 mg/dL — ABNORMAL HIGH (ref 0.44–1.00)
GFR, Estimated: 53 mL/min — ABNORMAL LOW (ref >60)
Glucose, Bld: 118 mg/dL — ABNORMAL HIGH (ref 70–99)
Potassium: 4.2 mmol/L (ref 3.5–5.1)
Sodium: 134 mmol/L — ABNORMAL LOW (ref 135–145)

## 2022-08-29 LAB — BRAIN NATRIURETIC PEPTIDE: B Natriuretic Peptide: 59.8 pg/mL (ref 0.0–100.0)

## 2022-08-29 NOTE — Patient Instructions (Signed)
Thank you for coming in today  Labs were done today, if any labs are abnormal the clinic will call you No news is good news  Your physician recommends that you schedule a follow-up appointment in:  4 months with Dr. Aundra Dubin with Your physician has requested that you have an echocardiogram.  You will receive a reminder letter in the mail a few months in advance. If you don't receive a letter, please call our office to schedule the follow-up appointment.  Echocardiography is a painless test that uses sound waves to create images of your heart. It provides your doctor with information about the size and shape of your heart and how well your heart's chambers and valves are working. This procedure takes approximately one hour. There are no restrictions for this procedure.    Do the following things EVERYDAY: Weigh yourself in the morning before breakfast. Write it down and keep it in a log. Take your medicines as prescribed Eat low salt foods--Limit salt (sodium) to 2000 mg per day.  Stay as active as you can everyday Limit all fluids for the day to less than 2 liters  At the [facility], you and your health needs are our priority. As part of our continuing mission to provide you with exceptional heart care, we have created designated Provider Care Teams. These Care Teams include your primary Cardiologist (physician) and Advanced Practice Providers (APPs- Physician Assistants and Nurse Practitioners) who all work together to provide you with the care you need, when you need it.   You may see any of the following providers on your designated Care Team at your next follow up: Dr Glori Bickers Dr Loralie Champagne Dr. Roxana Hires, NP Lyda Jester, [place] [facility] [place] Mize, [place] Forestine Na, NP Audry Riles, PharmD   Please be sure to bring in all your medications bottles to every appointment.    Thank you for choosing [facility]   If you have any questions or concerns before your next appointment please send [place] a message through [place] or call our office at [phone].    TO LEAVE A MESSAGE FOR THE NURSE SELECT OPTION 2, PLEASE LEAVE A MESSAGE INCLUDING: YOUR NAME DATE OF BIRTH CALL BACK NUMBER REASON FOR CALL**this is important as we prioritize the call backs  YOU WILL RECEIVE A CALL BACK THE SAME DAY AS LONG AS YOU CALL BEFORE 4:00 PM

## 2022-09-16 ENCOUNTER — Ambulatory Visit: Payer: BC Managed Care – PPO

## 2022-09-16 DIAGNOSIS — Z95810 Presence of automatic (implantable) cardiac defibrillator: Secondary | ICD-10-CM

## 2022-09-16 DIAGNOSIS — I5022 Chronic systolic (congestive) heart failure: Secondary | ICD-10-CM | POA: Diagnosis not present

## 2022-09-19 ENCOUNTER — Telehealth: Payer: Self-pay

## 2022-09-19 NOTE — Progress Notes (Signed)
EPIC Encounter for ICM Monitoring  Patient Name: Alison Howard is a 62 y.o. female Date: 09/19/2022 Primary Care Physican: Shirline Frees, MD Primary Cardiologist: Aundra Dubin Electrophysiologist: Caryl Comes BiV Pacing: 95% 12/30/2021 Weight: 230 lbs 04/23/2022 Weight: 240 lbs 08/29/2022 Office Weight: 241 lbs         Attempted call to patient and unable to reach.  Transmission reviewed.    Corvue thoracic impedance normal but was suggesting possible fluid accumulation from 1/15-1/26 followed by possible dryness 1/30-2/7.   Prescribed:  Furosemide 20 mg Take 1 tablet (20 mg) by mouth daily.  Potassium 20 mEq take 1 tablet by mouth daily.   Labs: 08/29/2022 Creatinine 1.17, BUN 18, Potassium 4.2, Sodium 134, GFR 53 09/27/2021 Creatinine 1.21, BUN 11, Potassium 4.3, Sodium 136, GFR 51 A complete set of results can be found in Results Review.   Recommendations: Unable to reach.    Follow-up plan: ICM clinic phone appointment on 10/20/2022.   91 day device clinic remote transmission 10/03/2022.     EP/Cardiology next office visit:   Recall 6/1/20243 with [facility] PA/NP.  Recall 02/14/2023 with Dr Caryl Comes.   Copy of ICM check sent to Dr. Caryl Comes.    3 month ICM trend: 09/16/2022.    12-14 Month ICM trend:     [name], RN 09/19/2022 4:40 PM

## 2022-09-19 NOTE — Telephone Encounter (Signed)
Remote ICM transmission received.  Attempted call to patient regarding ICM remote transmission and no answer.  Voice mail has not been set up.

## 2022-09-20 DIAGNOSIS — Z1231 Encounter for screening mammogram for malignant neoplasm of breast: Secondary | ICD-10-CM | POA: Diagnosis not present

## 2022-10-03 ENCOUNTER — Ambulatory Visit (INDEPENDENT_AMBULATORY_CARE_PROVIDER_SITE_OTHER): Payer: BC Managed Care – PPO

## 2022-10-03 DIAGNOSIS — I429 Cardiomyopathy, unspecified: Secondary | ICD-10-CM

## 2022-10-06 LAB — CUP PACEART REMOTE DEVICE CHECK
Battery Remaining Longevity: 41 mo
Battery Remaining Percentage: 44 %
Battery Voltage: 2.93 V
Brady Statistic AP VP Percent: 5.6 %
Brady Statistic AP VS Percent: 1 %
Brady Statistic AS VP Percent: 90 %
Brady Statistic AS VS Percent: 1.3 %
Brady Statistic RA Percent Paced: 2.4 %
Date Time Interrogation Session: 20240310033035
HighPow Impedance: 74 Ohm
HighPow Impedance: 74 Ohm
Implantable Lead Connection Status: 753985
Implantable Lead Connection Status: 753985
Implantable Lead Connection Status: 753985
Implantable Lead Implant Date: 20120529
Implantable Lead Implant Date: 20120529
Implantable Lead Implant Date: 20120529
Implantable Lead Location: 753858
Implantable Lead Location: 753859
Implantable Lead Location: 753860
Implantable Pulse Generator Implant Date: 20190724
Lead Channel Impedance Value: 1325 Ohm
Lead Channel Impedance Value: 450 Ohm
Lead Channel Impedance Value: 610 Ohm
Lead Channel Pacing Threshold Amplitude: 0.75 V
Lead Channel Pacing Threshold Amplitude: 1.25 V
Lead Channel Pacing Threshold Amplitude: 1.375 V
Lead Channel Pacing Threshold Pulse Width: 0.5 ms
Lead Channel Pacing Threshold Pulse Width: 0.5 ms
Lead Channel Pacing Threshold Pulse Width: 0.5 ms
Lead Channel Sensing Intrinsic Amplitude: 12 mV
Lead Channel Sensing Intrinsic Amplitude: 4 mV
Lead Channel Setting Pacing Amplitude: 2 V
Lead Channel Setting Pacing Amplitude: 2 V
Lead Channel Setting Pacing Amplitude: 2.25 V
Lead Channel Setting Pacing Pulse Width: 0.5 ms
Lead Channel Setting Pacing Pulse Width: 0.5 ms
Lead Channel Setting Sensing Sensitivity: 0.5 mV
Pulse Gen Serial Number: 9842506

## 2022-10-20 ENCOUNTER — Ambulatory Visit: Payer: BC Managed Care – PPO | Attending: Internal Medicine

## 2022-10-20 DIAGNOSIS — I5022 Chronic systolic (congestive) heart failure: Secondary | ICD-10-CM

## 2022-10-20 DIAGNOSIS — Z95810 Presence of automatic (implantable) cardiac defibrillator: Secondary | ICD-10-CM | POA: Diagnosis not present

## 2022-10-21 ENCOUNTER — Telehealth: Payer: Self-pay

## 2022-10-21 NOTE — Progress Notes (Signed)
EPIC Encounter for ICM Monitoring  Patient Name: Alison Howard is a 62 y.o. female Date: 10/21/2022 Primary Care Physican: Shirline Frees, MD Primary Cardiologist: Aundra Dubin Electrophysiologist: Caryl Comes BiV Pacing: 95% 12/30/2021 Weight: 230 lbs 04/23/2022 Weight: 240 lbs 08/29/2022 Office Weight: 241 lbs         Attempted call to patient and unable to reach.  Transmission reviewed.    Corvue thoracic impedance normal but was suggesting possible fluid accumulation from 3/11-3/24.   Prescribed:  Furosemide 20 mg Take 1 tablet (20 mg) by mouth daily.  Potassium 20 mEq take 1 tablet by mouth daily.   Labs: 08/29/2022 Creatinine 1.17, BUN 18, Potassium 4.2, Sodium 134, GFR 53 09/27/2021 Creatinine 1.21, BUN 11, Potassium 4.3, Sodium 136, GFR 51 A complete set of results can be found in Results Review.   Recommendations: Unable to reach.    Follow-up plan: ICM clinic phone appointment on 11/24/2022.   91 day device clinic remote transmission 01/02/2023.     EP/Cardiology next office visit:   Recall 6/1/20243 with [facility] PA/NP.  Recall 02/14/2023 with Dr Caryl Comes.   Copy of ICM check sent to Dr. Caryl Comes.    3 month ICM trend: 10/20/2022.    12-14 Month ICM trend:     [name], RN 10/21/2022 11:59 AM

## 2022-10-21 NOTE — Telephone Encounter (Signed)
Remote ICM transmission received.  Attempted call to patient regarding ICM remote transmission and left detailed message per DPR.  Advised to return call for any fluid symptoms or questions. Next ICM remote transmission scheduled 11/24/2022.

## 2022-10-31 NOTE — Progress Notes (Signed)
Remote ICD transmission.   

## 2022-11-07 ENCOUNTER — Telehealth (HOSPITAL_COMMUNITY): Payer: Self-pay | Admitting: Cardiology

## 2022-11-07 NOTE — Telephone Encounter (Signed)
Debbie from [facility], Case manager, please contact her pre certs and other concerns [phone].

## 2022-11-07 NOTE — Telephone Encounter (Signed)
Debbie called to introduce herself as the nurse case manager for this patient  Noted

## 2022-11-07 NOTE — Telephone Encounter (Signed)
Returned call to Eustace Quail if this was a FYI or if information is needed for this patient [facility]

## 2022-11-24 ENCOUNTER — Ambulatory Visit: Payer: BC Managed Care – PPO | Attending: Internal Medicine

## 2022-11-24 DIAGNOSIS — I5022 Chronic systolic (congestive) heart failure: Secondary | ICD-10-CM

## 2022-11-24 DIAGNOSIS — Z95810 Presence of automatic (implantable) cardiac defibrillator: Secondary | ICD-10-CM | POA: Diagnosis not present

## 2022-11-26 ENCOUNTER — Telehealth: Payer: Self-pay

## 2022-11-26 NOTE — Telephone Encounter (Signed)
Remote ICM transmission received.  Attempted call to patient regarding ICM remote transmission and left detailed message per DPR.  Advised to return call for any fluid symptoms or questions. Next ICM remote transmission scheduled 12/08/2022.    

## 2022-11-26 NOTE — Progress Notes (Signed)
EPIC Encounter for ICM Monitoring  Patient Name: Alison Howard is a 62 y.o. female Date: 11/26/2022 Primary Care Physican: Johny Blamer, MD Primary Cardiologist: Shirlee Latch Electrophysiologist: Graciela Husbands BiV Pacing: 96% 12/30/2021 Weight: 230 lbs 04/23/2022 Weight: 240 lbs 08/29/2022 Office Weight: 241 lbs         Attempted call to patient and unable to reach.  Left detailed message per DPR regarding transmission. Transmission reviewed.    Corvue thoracic impedance suggesting possible fluid accumulation from 3/11-3/24, 3/29-4/4, 4/10-4/16 and possible dryness 4/18-4/28.  Unstable fluid levels within the last month.   Prescribed:  Furosemide 20 mg Take 1 tablet (20 mg) by mouth daily.  Potassium 20 mEq take 1 tablet by mouth daily.   Labs: 08/29/2022 Creatinine 1.17, BUN 18, Potassium 4.2, Sodium 134, GFR 53 09/27/2021 Creatinine 1.21, BUN 11, Potassium 4.3, Sodium 136, GFR 51 A complete set of results can be found in Results Review.   Recommendations: Left voice mail with ICM number and encouraged to call if experiencing any fluid symptoms.   Follow-up plan: ICM clinic phone appointment on 12/08/2022 to recheck fluid levels.   91 day device clinic remote transmission 01/02/2023.     EP/Cardiology next office visit:   Recall 6/1/20243 with [facility] PA/NP.  Recall 02/14/2023 with Dr Graciela Husbands.   Copy of ICM check sent to Dr. Graciela Husbands.    3 month ICM trend: 11/24/2022.    12-14 Month ICM trend:     Karie Soda, RN 11/26/2022 8:37 AM

## 2022-12-08 ENCOUNTER — Ambulatory Visit: Payer: BC Managed Care – PPO | Attending: Internal Medicine

## 2022-12-08 DIAGNOSIS — Z95810 Presence of automatic (implantable) cardiac defibrillator: Secondary | ICD-10-CM

## 2022-12-08 DIAGNOSIS — I5022 Chronic systolic (congestive) heart failure: Secondary | ICD-10-CM

## 2022-12-08 NOTE — Progress Notes (Signed)
EPIC Encounter for ICM Monitoring  Patient Name: Alison Howard is a 62 y.o. female Date: 12/08/2022 Primary Care Physican: Johny Blamer, MD Primary Cardiologist: Shirlee Latch Electrophysiologist: Graciela Husbands BiV Pacing: 96% 12/30/2021 Weight: 230 lbs 04/23/2022 Weight: 240 lbs 08/29/2022 Office Weight: 241 lbs         Transmission reviewed.    Corvue thoracic impedance suggesting fluid levels returned to normal.  Unstable fluid levels since 09/2022.   Prescribed:  Furosemide 20 mg Take 1 tablet (20 mg) by mouth daily.  Potassium 20 mEq take 1 tablet by mouth daily.   Labs: 08/29/2022 Creatinine 1.17, BUN 18, Potassium 4.2, Sodium 134, GFR 53 09/27/2021 Creatinine 1.21, BUN 11, Potassium 4.3, Sodium 136, GFR 51 A complete set of results can be found in Results Review.   Recommendations:  No changes.    Follow-up plan: ICM clinic phone appointment on 12/29/2022.   91 day device clinic remote transmission 01/02/2023.     EP/Cardiology next office visit:   Recall 6/1/20243 with [facility] PA/NP.  Recall 02/14/2023 with Dr Graciela Husbands.   Copy of ICM check sent to Dr. Graciela Husbands.     3 month ICM trend: 12/08/2022.    12-14 Month ICM trend:     Karie Soda, RN 12/08/2022 7:26 AM

## 2022-12-14 ENCOUNTER — Other Ambulatory Visit: Payer: Self-pay | Admitting: Cardiology

## 2022-12-27 DIAGNOSIS — Z23 Encounter for immunization: Secondary | ICD-10-CM | POA: Diagnosis not present

## 2022-12-27 DIAGNOSIS — Z6841 Body Mass Index (BMI) 40.0 and over, adult: Secondary | ICD-10-CM | POA: Diagnosis not present

## 2022-12-27 DIAGNOSIS — B372 Candidiasis of skin and nail: Secondary | ICD-10-CM | POA: Diagnosis not present

## 2022-12-29 ENCOUNTER — Ambulatory Visit: Payer: BC Managed Care – PPO | Attending: Internal Medicine

## 2022-12-29 DIAGNOSIS — I5022 Chronic systolic (congestive) heart failure: Secondary | ICD-10-CM | POA: Diagnosis not present

## 2022-12-29 DIAGNOSIS — Z95810 Presence of automatic (implantable) cardiac defibrillator: Secondary | ICD-10-CM | POA: Diagnosis not present

## 2022-12-31 NOTE — Progress Notes (Signed)
EPIC Encounter for ICM Monitoring  Patient Name: Alison Howard is a 62 y.o. female Date: 12/31/2022 Primary Care Physican: Johny Blamer, MD Primary Cardiologist: Shirlee Latch Electrophysiologist: Graciela Husbands BiV Pacing: 96% 12/30/2021 Weight: 230 lbs 04/23/2022 Weight: 240 lbs 08/29/2022 Office Weight: 241 lbs         Spoke with patient and heart failure questions reviewed.  Transmission results reviewed.  Pt asymptomatic for fluid accumulation.  Reports feeling well at this time and voices no complaints.     Corvue thoracic impedance suggesting normal fluid levels with the exception of possible fluid accumulation from 4/28-5/11.  Unstable fluid levels since 09/2022.   Prescribed:  Furosemide 20 mg Take 1 tablet (20 mg) by mouth daily.  Potassium 20 mEq take 1 tablet by mouth daily.   Labs: 08/29/2022 Creatinine 1.17, BUN 18, Potassium 4.2, Sodium 134, GFR 53 09/27/2021 Creatinine 1.21, BUN 11, Potassium 4.3, Sodium 136, GFR 51 A complete set of results can be found in Results Review.   Recommendations:  No changes and encouraged to call if experiencing any fluid symptoms.   Follow-up plan: ICM clinic phone appointment on 02/02/2023.   91 day device clinic remote transmission 04/03/2023.     EP/Cardiology next office visit:   Advised to call the office to schedule appointments. Recall 12/27/2022 with [facility] PA/NP.  Recall 02/14/2023 with Dr Graciela Husbands.   Copy of ICM check sent to Dr. Graciela Husbands.      3 month ICM trend: 12/29/2022.    12-14 Month ICM trend:     Karie Soda, RN 12/31/2022 2:35 PM

## 2023-01-02 ENCOUNTER — Ambulatory Visit (INDEPENDENT_AMBULATORY_CARE_PROVIDER_SITE_OTHER): Payer: BC Managed Care – PPO

## 2023-01-02 DIAGNOSIS — I429 Cardiomyopathy, unspecified: Secondary | ICD-10-CM | POA: Diagnosis not present

## 2023-01-02 LAB — CUP PACEART REMOTE DEVICE CHECK
Battery Remaining Longevity: 38 mo
Battery Remaining Percentage: 42 %
Battery Voltage: 2.93 V
Brady Statistic AP VP Percent: 5.1 %
Brady Statistic AP VS Percent: 1 %
Brady Statistic AS VP Percent: 91 %
Brady Statistic AS VS Percent: 1.2 %
Brady Statistic RA Percent Paced: 2.2 %
Date Time Interrogation Session: 20240607020306
HighPow Impedance: 65 Ohm
HighPow Impedance: 65 Ohm
Implantable Lead Connection Status: 753985
Implantable Lead Connection Status: 753985
Implantable Lead Connection Status: 753985
Implantable Lead Implant Date: 20120529
Implantable Lead Implant Date: 20120529
Implantable Lead Implant Date: 20120529
Implantable Lead Location: 753858
Implantable Lead Location: 753859
Implantable Lead Location: 753860
Implantable Pulse Generator Implant Date: 20190724
Lead Channel Impedance Value: 1200 Ohm
Lead Channel Impedance Value: 460 Ohm
Lead Channel Impedance Value: 530 Ohm
Lead Channel Pacing Threshold Amplitude: 0.75 V
Lead Channel Pacing Threshold Amplitude: 1.25 V
Lead Channel Pacing Threshold Amplitude: 1.25 V
Lead Channel Pacing Threshold Pulse Width: 0.5 ms
Lead Channel Pacing Threshold Pulse Width: 0.5 ms
Lead Channel Pacing Threshold Pulse Width: 0.5 ms
Lead Channel Sensing Intrinsic Amplitude: 12 mV
Lead Channel Sensing Intrinsic Amplitude: 3.1 mV
Lead Channel Setting Pacing Amplitude: 2 V
Lead Channel Setting Pacing Amplitude: 2 V
Lead Channel Setting Pacing Amplitude: 2.25 V
Lead Channel Setting Pacing Pulse Width: 0.5 ms
Lead Channel Setting Pacing Pulse Width: 0.5 ms
Lead Channel Setting Sensing Sensitivity: 0.5 mV
Pulse Gen Serial Number: 9842506

## 2023-01-05 ENCOUNTER — Other Ambulatory Visit: Payer: Self-pay | Admitting: Cardiology

## 2023-01-15 DIAGNOSIS — R21 Rash and other nonspecific skin eruption: Secondary | ICD-10-CM | POA: Diagnosis not present

## 2023-01-19 NOTE — Progress Notes (Signed)
Remote ICD transmission.   

## 2023-02-02 ENCOUNTER — Ambulatory Visit: Payer: BC Managed Care – PPO

## 2023-02-02 DIAGNOSIS — Z95810 Presence of automatic (implantable) cardiac defibrillator: Secondary | ICD-10-CM | POA: Diagnosis not present

## 2023-02-02 DIAGNOSIS — I5022 Chronic systolic (congestive) heart failure: Secondary | ICD-10-CM | POA: Diagnosis not present

## 2023-02-03 DIAGNOSIS — M25552 Pain in left hip: Secondary | ICD-10-CM | POA: Diagnosis not present

## 2023-02-03 NOTE — Progress Notes (Signed)
EPIC Encounter for ICM Monitoring  Patient Name: Alison Howard is a 62 y.o. female Date: 02/03/2023 Primary Care Physican: Noberto Retort, MD Primary Cardiologist: Shirlee Latch Electrophysiologist: Graciela Husbands BiV Pacing: 96% 12/30/2021 Weight: 230 lbs 04/23/2022 Weight: 240 lbs 08/29/2022 Office Weight: 241 lbs 02/03/2023 Weight: 235 lbs         Spoke with patient and heart failure questions reviewed.  Transmission results reviewed.  Pt asymptomatic for fluid accumulation.  Reports feeling well at this time and voices no complaints.     Corvue thoracic impedance suggesting possible fluid accumulation 6/18-6/23, 6/24-6/28, 6/30-7/3 and starting 7/7.  Difficulty maintaining normal fluid levels.   Prescribed:  Furosemide 20 mg Take 1 tablet (20 mg) by mouth daily.  Potassium 20 mEq take 1 tablet by mouth daily.   Labs: 08/29/2022 Creatinine 1.17, BUN 18, Potassium 4.2, Sodium 134, GFR 53 09/27/2021 Creatinine 1.21, BUN 11, Potassium 4.3, Sodium 136, GFR 51 A complete set of results can be found in Results Review.   Recommendations:  Advised to call back if develops fluid symptoms.  Will recheck fluid levels next week.   Follow-up plan: ICM clinic phone appointment on 02/09/2023 to recheck fluid levels.   91 day device clinic remote transmission 04/03/2023.     EP/Cardiology next office visit:   Advised to call the office to schedule appointments. Recall 12/27/2022 with [facility] PA/NP.  Recall 02/14/2023 with Dr Graciela Husbands.   Copy of ICM check sent to Dr. Graciela Husbands.     3 month ICM trend: 02/02/2023.    12-14 Month ICM trend:     Karie Soda, RN 02/03/2023 2:05 PM

## 2023-02-09 ENCOUNTER — Ambulatory Visit: Payer: BC Managed Care – PPO | Attending: Internal Medicine

## 2023-02-09 DIAGNOSIS — I5022 Chronic systolic (congestive) heart failure: Secondary | ICD-10-CM

## 2023-02-09 DIAGNOSIS — Z95810 Presence of automatic (implantable) cardiac defibrillator: Secondary | ICD-10-CM

## 2023-02-11 NOTE — Progress Notes (Signed)
EPIC Encounter for ICM Monitoring  Patient Name: Alison Howard is a 62 y.o. female Date: 02/11/2023 Primary Care Physican: Noberto Retort, MD Primary Cardiologist: Shirlee Latch Electrophysiologist: Graciela Husbands BiV Pacing: 96% 12/30/2021 Weight: 230 lbs 04/23/2022 Weight: 240 lbs 08/29/2022 Office Weight: 241 lbs 02/03/2023 Weight: 235 lbs         Spoke with patient and heart failure questions reviewed.  Transmission results reviewed.  Pt asymptomatic for fluid accumulation.  Reports feeling well at this time and voices no complaints.     Corvue thoracic impedance suggesting fluid levels returned to normal.  Difficulty maintaining normal fluid levels.   Prescribed:  Furosemide 20 mg Take 1 tablet (20 mg) by mouth daily.  Potassium 20 mEq take 1 tablet by mouth daily.   Labs: 08/29/2022 Creatinine 1.17, BUN 18, Potassium 4.2, Sodium 134, GFR 53 09/27/2021 Creatinine 1.21, BUN 11, Potassium 4.3, Sodium 136, GFR 51 A complete set of results can be found in Results Review.   Recommendations:  No changes and encouraged to call if experiencing any fluid symptoms.   Follow-up plan: ICM clinic phone appointment on 03/09/2023.   91 day device clinic remote transmission 04/03/2023.     EP/Cardiology next office visit:   Aware to call the office to schedule appointments. Recall 12/27/2022 with [facility] PA/NP.  Recall 02/14/2023 with Dr Graciela Husbands.   Copy of ICM check sent to Dr. Graciela Husbands.      3 month ICM trend: 02/09/2023.    12-14 Month ICM trend:     Karie Soda, RN 02/11/2023 12:59 PM

## 2023-02-17 DIAGNOSIS — Z0000 Encounter for general adult medical examination without abnormal findings: Secondary | ICD-10-CM | POA: Diagnosis not present

## 2023-02-17 DIAGNOSIS — E7800 Pure hypercholesterolemia, unspecified: Secondary | ICD-10-CM | POA: Diagnosis not present

## 2023-02-17 DIAGNOSIS — E038 Other specified hypothyroidism: Secondary | ICD-10-CM | POA: Diagnosis not present

## 2023-02-20 DIAGNOSIS — M7062 Trochanteric bursitis, left hip: Secondary | ICD-10-CM | POA: Diagnosis not present

## 2023-02-20 DIAGNOSIS — M25552 Pain in left hip: Secondary | ICD-10-CM | POA: Diagnosis not present

## 2023-02-24 DIAGNOSIS — M7062 Trochanteric bursitis, left hip: Secondary | ICD-10-CM | POA: Diagnosis not present

## 2023-02-24 DIAGNOSIS — M1612 Unilateral primary osteoarthritis, left hip: Secondary | ICD-10-CM | POA: Diagnosis not present

## 2023-02-26 DIAGNOSIS — M1612 Unilateral primary osteoarthritis, left hip: Secondary | ICD-10-CM | POA: Diagnosis not present

## 2023-02-26 DIAGNOSIS — M7062 Trochanteric bursitis, left hip: Secondary | ICD-10-CM | POA: Diagnosis not present

## 2023-03-03 DIAGNOSIS — M7062 Trochanteric bursitis, left hip: Secondary | ICD-10-CM | POA: Diagnosis not present

## 2023-03-03 DIAGNOSIS — M1612 Unilateral primary osteoarthritis, left hip: Secondary | ICD-10-CM | POA: Diagnosis not present

## 2023-03-09 ENCOUNTER — Ambulatory Visit: Payer: BC Managed Care – PPO | Attending: Internal Medicine

## 2023-03-09 DIAGNOSIS — I5022 Chronic systolic (congestive) heart failure: Secondary | ICD-10-CM

## 2023-03-09 DIAGNOSIS — Z95810 Presence of automatic (implantable) cardiac defibrillator: Secondary | ICD-10-CM

## 2023-03-10 DIAGNOSIS — M7062 Trochanteric bursitis, left hip: Secondary | ICD-10-CM | POA: Diagnosis not present

## 2023-03-10 DIAGNOSIS — M1612 Unilateral primary osteoarthritis, left hip: Secondary | ICD-10-CM | POA: Diagnosis not present

## 2023-03-12 ENCOUNTER — Other Ambulatory Visit (HOSPITAL_COMMUNITY): Payer: Self-pay | Admitting: Cardiology

## 2023-03-13 ENCOUNTER — Telehealth: Payer: Self-pay

## 2023-03-13 DIAGNOSIS — M7062 Trochanteric bursitis, left hip: Secondary | ICD-10-CM | POA: Diagnosis not present

## 2023-03-13 DIAGNOSIS — M1612 Unilateral primary osteoarthritis, left hip: Secondary | ICD-10-CM | POA: Diagnosis not present

## 2023-03-13 NOTE — Telephone Encounter (Signed)
 Remote ICM transmission received.  Attempted call to patient regarding ICM remote transmission and left detailed message per DPR.  Left ICM phone number and advised to return call for any fluid symptoms or questions. Next ICM remote transmission scheduled 04/13/2023.

## 2023-03-13 NOTE — Progress Notes (Signed)
EPIC Encounter for ICM Monitoring  Patient Name: Alison Howard is a 62 y.o. female Date: 03/13/2023 Primary Care Physican: Noberto Retort, MD Primary Cardiologist: Shirlee Latch Electrophysiologist: Graciela Husbands BiV Pacing: 96% 12/30/2021 Weight: 230 lbs 04/23/2022 Weight: 240 lbs 08/29/2022 Office Weight: 241 lbs 02/03/2023 Weight: 235 lbs   Attempted call to patient and unable to reach.  Left detailed message per DPR regarding transmission. Transmission reviewed.     Corvue thoracic impedance suggesting normal fluid levels within the last month.   Prescribed:  Furosemide 20 mg Take 1 tablet (20 mg) by mouth daily.  Potassium 20 mEq take 1 tablet by mouth daily.   Labs: 08/29/2022 Creatinine 1.17, BUN 18, Potassium 4.2, Sodium 134, GFR 53 09/27/2021 Creatinine 1.21, BUN 11, Potassium 4.3, Sodium 136, GFR 51 A complete set of results can be found in Results Review.   Recommendations:  Left voice mail with ICM number and encouraged to call if experiencing any fluid symptoms..   Follow-up plan: ICM clinic phone appointment on  04/13/2023.   91 day device clinic remote transmission 04/03/2023.     EP/Cardiology next office visit:   03/20/2023 with Otilio Saber, PA.  Aware to call the office to schedule appointments. Recall 12/27/2022 with [facility] PA/NP.     Copy of ICM check sent to Dr. Graciela Husbands.      3 month ICM trend: 03/09/2023.    12-14 Month ICM trend:     Karie Soda, RN 03/13/2023 2:53 PM

## 2023-03-17 DIAGNOSIS — M1612 Unilateral primary osteoarthritis, left hip: Secondary | ICD-10-CM | POA: Diagnosis not present

## 2023-03-17 DIAGNOSIS — M7062 Trochanteric bursitis, left hip: Secondary | ICD-10-CM | POA: Diagnosis not present

## 2023-03-19 DIAGNOSIS — M7062 Trochanteric bursitis, left hip: Secondary | ICD-10-CM | POA: Diagnosis not present

## 2023-03-19 DIAGNOSIS — M1612 Unilateral primary osteoarthritis, left hip: Secondary | ICD-10-CM | POA: Diagnosis not present

## 2023-03-19 NOTE — Progress Notes (Signed)
  Electrophysiology Office Note:   ID:  Alison Howard, DOB 10-15-1960, MRN [id number]  Primary Cardiologist: None Electrophysiologist: Sherryl Manges, MD      History of Present Illness:   Alison Howard is a 62 y.o. female with h/o CHF, PVCs, VT s/p ablation, OSA, Obesity, and s/p St Jude CRT-D seen today for routine  electrophysiology followup.   Since last being seen in our clinic the patient reports doing very well overall. Her BP is chronically low. She denies any lightheadedness or dizziness, and states it comes up through the day. she denies chest pain, palpitations, dyspnea, PND, orthopnea, nausea, vomiting, dizziness, syncope, edema, weight gain, or early satiety.   Review of systems complete and found to be negative unless listed in HPI.   EP Information / Studies Reviewed:    EKG is ordered today. Personal review as below.  EKG Interpretation Date/Time:  Friday March 20 2023 08:34:47 EDT Ventricular Rate:  70 PR Interval:  126 QRS Duration:  126 QT Interval:  496 QTC Calculation: 535 R Axis:   -78  Text Interpretation: Atrial-sensed ventricular-paced rhythm Biventricular pacemaker detected Confirmed by Maxine Glenn [id number]) on 03/20/2023 8:42:46 AM    ICD Interrogation-  reviewed in detail today,  See PACEART report.  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    Physical Exam:   VS:  BP (!) 80/60   Pulse 70   Ht 5\' 2"  (1.575 m)   Wt 236 lb (107 kg)   BMI 43.16 kg/m    Wt Readings from Last 3 Encounters:  03/20/23 236 lb (107 kg)  08/29/22 241 lb (109.3 kg)  01/29/22 232 lb (105.2 kg)     GEN: Well nourished, well developed in no acute distress NECK: No JVD; No carotid bruits CARDIAC: Regular rate and rhythm, no murmurs, rubs, gallops RESPIRATORY:  Clear to auscultation without rales, wheezing or rhonchi  ABDOMEN: Soft, non-tender, non-distended EXTREMITIES:  No edema; No deformity   ASSESSMENT AND PLAN:    Chronic systolic dysfunction s/p  Abbott CRT-D  11/2020 LVEF 35-40% euvolemic today Stable on an appropriate medical regimen Normal ICD function See Pace Art report No changes today  H/o VT PVCs S/p VT ablation 09/2021 with Dr. Ladona Ridgel.  Continue bisoprolol EF not felt to have improved with control of PVCs, previously 4%  Hypothyroidism Per PCP  Disposition:   Follow up with Dr. Graciela Husbands in 6 months   Signed, Graciella Freer, PA-C

## 2023-03-20 ENCOUNTER — Ambulatory Visit: Payer: BC Managed Care – PPO | Attending: Student | Admitting: Student

## 2023-03-20 ENCOUNTER — Encounter: Payer: Self-pay | Admitting: Student

## 2023-03-20 VITALS — BP 80/60 | HR 70 | Ht 62.0 in | Wt 236.0 lb

## 2023-03-20 DIAGNOSIS — I493 Ventricular premature depolarization: Secondary | ICD-10-CM | POA: Diagnosis not present

## 2023-03-20 DIAGNOSIS — I429 Cardiomyopathy, unspecified: Secondary | ICD-10-CM | POA: Diagnosis not present

## 2023-03-20 DIAGNOSIS — I5022 Chronic systolic (congestive) heart failure: Secondary | ICD-10-CM

## 2023-03-20 LAB — CUP PACEART INCLINIC DEVICE CHECK
Battery Remaining Longevity: 36 mo
Brady Statistic RA Percent Paced: 2.3 %
Brady Statistic RV Percent Paced: 96 %
Date Time Interrogation Session: 20240823085435
HighPow Impedance: 67.7484
Implantable Lead Connection Status: 753985
Implantable Lead Connection Status: 753985
Implantable Lead Connection Status: 753985
Implantable Lead Implant Date: 20120529
Implantable Lead Implant Date: 20120529
Implantable Lead Implant Date: 20120529
Implantable Lead Location: 753858
Implantable Lead Location: 753859
Implantable Lead Location: 753860
Implantable Pulse Generator Implant Date: 20190724
Lead Channel Impedance Value: 1237.5 Ohm
Lead Channel Impedance Value: 462.5 Ohm
Lead Channel Impedance Value: 550 Ohm
Lead Channel Pacing Threshold Amplitude: 0.75 V
Lead Channel Pacing Threshold Amplitude: 0.75 V
Lead Channel Pacing Threshold Amplitude: 1.25 V
Lead Channel Pacing Threshold Amplitude: 1.25 V
Lead Channel Pacing Threshold Amplitude: 1.5 V
Lead Channel Pacing Threshold Pulse Width: 0.5 ms
Lead Channel Pacing Threshold Pulse Width: 0.5 ms
Lead Channel Pacing Threshold Pulse Width: 0.5 ms
Lead Channel Pacing Threshold Pulse Width: 0.5 ms
Lead Channel Pacing Threshold Pulse Width: 0.5 ms
Lead Channel Sensing Intrinsic Amplitude: 12 mV
Lead Channel Sensing Intrinsic Amplitude: 3.7 mV
Lead Channel Setting Pacing Amplitude: 2 V
Lead Channel Setting Pacing Amplitude: 2 V
Lead Channel Setting Pacing Amplitude: 2.25 V
Lead Channel Setting Pacing Pulse Width: 0.5 ms
Lead Channel Setting Pacing Pulse Width: 0.5 ms
Lead Channel Setting Sensing Sensitivity: 0.5 mV
Pulse Gen Serial Number: 9842506

## 2023-03-20 NOTE — Patient Instructions (Signed)
Medication Instructions:  Your physician recommends that you continue on your current medications as directed. Please refer to the Current Medication list given to you today.  *If you need a refill on your cardiac medications before your next appointment, please call your pharmacy*  Lab Work: BMET-TODAY If you have labs (blood work) drawn today and your tests are completely normal, you will receive your results only by: MyChart Message (if you have MyChart) OR A paper copy in the mail If you have any lab test that is abnormal or we need to change your treatment, we will call you to review the results.  Follow-Up: At [facility], you and your health needs are our priority.  As part of our continuing mission to provide you with exceptional heart care, we have created designated Provider Care Teams.  These Care Teams include your primary Cardiologist (physician) and Advanced Practice Providers (APPs -  Physician Assistants and Nurse Practitioners) who all work together to provide you with the care you need, when you need it.  Your next appointment:   6 month(s)  Provider:   Sherryl Manges, MD or [facility]" [place], [place]

## 2023-03-21 LAB — BASIC METABOLIC PANEL
BUN/Creatinine Ratio: 11 — ABNORMAL LOW (ref 12–28)
BUN: 13 mg/dL (ref 8–27)
CO2: 26 mmol/L (ref 20–29)
Calcium: 9.7 mg/dL (ref 8.7–10.3)
Chloride: 99 mmol/L (ref 96–106)
Creatinine, Ser: 1.2 mg/dL — ABNORMAL HIGH (ref 0.57–1.00)
Glucose: 90 mg/dL (ref 70–99)
Potassium: 4.9 mmol/L (ref 3.5–5.2)
Sodium: 140 mmol/L (ref 134–144)
eGFR: 51 mL/min/{1.73_m2} — ABNORMAL LOW (ref >59)

## 2023-04-03 ENCOUNTER — Ambulatory Visit (INDEPENDENT_AMBULATORY_CARE_PROVIDER_SITE_OTHER): Payer: BC Managed Care – PPO

## 2023-04-03 DIAGNOSIS — I429 Cardiomyopathy, unspecified: Secondary | ICD-10-CM | POA: Diagnosis not present

## 2023-04-06 LAB — CUP PACEART REMOTE DEVICE CHECK
Battery Remaining Longevity: 36 mo
Battery Remaining Percentage: 38 %
Battery Voltage: 2.92 V
Brady Statistic AP VP Percent: 5.3 %
Brady Statistic AP VS Percent: 1 %
Brady Statistic AS VP Percent: 91 %
Brady Statistic AS VS Percent: 1.2 %
Brady Statistic RA Percent Paced: 3.1 %
Date Time Interrogation Session: 20240909013841
HighPow Impedance: 65 Ohm
HighPow Impedance: 65 Ohm
Implantable Lead Connection Status: 753985
Implantable Lead Connection Status: 753985
Implantable Lead Connection Status: 753985
Implantable Lead Implant Date: 20120529
Implantable Lead Implant Date: 20120529
Implantable Lead Implant Date: 20120529
Implantable Lead Location: 753858
Implantable Lead Location: 753859
Implantable Lead Location: 753860
Implantable Pulse Generator Implant Date: 20190724
Lead Channel Impedance Value: 1225 Ohm
Lead Channel Impedance Value: 430 Ohm
Lead Channel Impedance Value: 590 Ohm
Lead Channel Pacing Threshold Amplitude: 0.75 V
Lead Channel Pacing Threshold Amplitude: 1.25 V
Lead Channel Pacing Threshold Amplitude: 1.75 V
Lead Channel Pacing Threshold Pulse Width: 0.5 ms
Lead Channel Pacing Threshold Pulse Width: 0.5 ms
Lead Channel Pacing Threshold Pulse Width: 0.5 ms
Lead Channel Sensing Intrinsic Amplitude: 12 mV
Lead Channel Sensing Intrinsic Amplitude: 2.8 mV
Lead Channel Setting Pacing Amplitude: 2 V
Lead Channel Setting Pacing Amplitude: 2.25 V
Lead Channel Setting Pacing Amplitude: 2.25 V
Lead Channel Setting Pacing Pulse Width: 0.5 ms
Lead Channel Setting Pacing Pulse Width: 0.5 ms
Lead Channel Setting Sensing Sensitivity: 0.5 mV
Pulse Gen Serial Number: 9842506

## 2023-04-07 ENCOUNTER — Other Ambulatory Visit (HOSPITAL_COMMUNITY): Payer: Self-pay

## 2023-04-07 ENCOUNTER — Other Ambulatory Visit: Payer: Self-pay | Admitting: Family Medicine

## 2023-04-07 DIAGNOSIS — I5022 Chronic systolic (congestive) heart failure: Secondary | ICD-10-CM

## 2023-04-07 NOTE — Progress Notes (Signed)
Remote ICD transmission.   

## 2023-04-13 ENCOUNTER — Ambulatory Visit: Payer: BC Managed Care – PPO | Attending: Internal Medicine

## 2023-04-13 DIAGNOSIS — Z95810 Presence of automatic (implantable) cardiac defibrillator: Secondary | ICD-10-CM

## 2023-04-13 DIAGNOSIS — I5022 Chronic systolic (congestive) heart failure: Secondary | ICD-10-CM | POA: Diagnosis not present

## 2023-04-15 ENCOUNTER — Telehealth: Payer: Self-pay

## 2023-04-15 NOTE — Telephone Encounter (Signed)
Remote ICM transmission received.  Attempted call to patient regarding ICM remote transmission and left detailed message per DPR.  Left ICM phone number and advised to return call for any fluid symptoms or questions. Next ICM remote transmission scheduled 05/18/2023.

## 2023-04-15 NOTE — Progress Notes (Signed)
EPIC Encounter for ICM Monitoring  Patient Name: Alison Howard is a 62 y.o. female Date: 04/15/2023 Primary Care Physican: Noberto Retort, MD Primary Cardiologist: Shirlee Latch Electrophysiologist: Graciela Husbands BiV Pacing: 96% 12/30/2021 Weight: 230 lbs 04/23/2022 Weight: 240 lbs 08/29/2022 Office Weight: 241 lbs 02/03/2023 Weight: 235 lbs    Attempted call to patient and unable to reach.  Left detailed message per DPR regarding transmission. Transmission reviewed.     Corvue thoracic impedance suggesting normal fluid with the exception of possible fluid accumulation from 9/6-9/13.   Prescribed:  Furosemide 20 mg Take 1 tablet (20 mg) by mouth daily.  Potassium 20 mEq take 1 tablet by mouth daily.   Labs: 08/29/2022 Creatinine 1.17, BUN 18, Potassium 4.2, Sodium 134, GFR 53 09/27/2021 Creatinine 1.21, BUN 11, Potassium 4.3, Sodium 136, GFR 51 A complete set of results can be found in Results Review.   Recommendations:  Left voice mail with ICM number and encouraged to call if experiencing any fluid symptoms..   Follow-up plan: ICM clinic phone appointment on 05/18/2023.   91 day device clinic remote transmission 07/03/2023.     EP/Cardiology next office visit:  Recall 09/16/2023 with Otilio Saber, PA.  06/17/2023 with Dr Shirlee Latch.     Copy of ICM check sent to Dr. Graciela Husbands.       3 month ICM trend: 04/13/2023.    12-14 Month ICM trend:     Karie Soda, RN 04/15/2023 4:15 PM

## 2023-05-18 ENCOUNTER — Ambulatory Visit: Payer: BC Managed Care – PPO | Attending: Internal Medicine

## 2023-05-18 DIAGNOSIS — I5022 Chronic systolic (congestive) heart failure: Secondary | ICD-10-CM

## 2023-05-18 DIAGNOSIS — Z95810 Presence of automatic (implantable) cardiac defibrillator: Secondary | ICD-10-CM | POA: Diagnosis not present

## 2023-05-19 ENCOUNTER — Telehealth: Payer: Self-pay

## 2023-05-19 NOTE — Progress Notes (Signed)
EPIC Encounter for ICM Monitoring  Patient Name: Alison Howard is a 62 y.o. female Date: 05/19/2023 Primary Care Physican: Noberto Retort, MD Primary Cardiologist: Shirlee Latch Electrophysiologist: Graciela Husbands BiV Pacing: 95% 12/30/2021 Weight: 230 lbs 04/23/2022 Weight: 240 lbs 08/29/2022 Office Weight: 241 lbs 02/03/2023 Weight: 235 lbs    Attempted call to patient and unable to reach.  Left detailed message per DPR regarding transmission. Transmission reviewed.     Corvue thoracic impedance suggesting possible fluid accumulation starting 10/18.   Prescribed:  Furosemide 20 mg Take 1 tablet (20 mg) by mouth daily.  Potassium 20 mEq take 1 tablet by mouth daily.   Labs: 08/29/2022 Creatinine 1.17, BUN 18, Potassium 4.2, Sodium 134, GFR 53 09/27/2021 Creatinine 1.21, BUN 11, Potassium 4.3, Sodium 136, GFR 51 A complete set of results can be found in Results Review.   Recommendations:  Left voice mail with ICM number and encouraged to call if experiencing any fluid symptoms..   Follow-up plan: ICM clinic phone appointment on 05/25/2023 to recheck fluid levels.   91 day device clinic remote transmission 07/03/2023.     EP/Cardiology next office visit:  Recall 09/16/2023 with Otilio Saber, PA.  06/17/2023 with Dr Shirlee Latch.     Copy of ICM check sent to Dr. Graciela Husbands.     3 month ICM trend: 05/18/2023.    12-14 Month ICM trend:     Karie Soda, RN 05/19/2023 1:28 PM

## 2023-05-19 NOTE — Telephone Encounter (Signed)
Remote ICM transmission received.  Attempted call to patient regarding ICM remote transmission and left detailed message per DPR.  Left ICM phone number and advised to return call for any fluid symptoms or questions. Next ICM remote transmission scheduled 05/25/2023.

## 2023-05-25 ENCOUNTER — Ambulatory Visit: Payer: BC Managed Care – PPO | Attending: Internal Medicine

## 2023-05-25 DIAGNOSIS — Z95810 Presence of automatic (implantable) cardiac defibrillator: Secondary | ICD-10-CM

## 2023-05-25 DIAGNOSIS — I5022 Chronic systolic (congestive) heart failure: Secondary | ICD-10-CM

## 2023-05-27 NOTE — Progress Notes (Signed)
EPIC Encounter for ICM Monitoring  Patient Name: Alison Howard is a 62 y.o. female Date: 05/27/2023 Primary Care Physican: Noberto Retort, MD Primary Cardiologist: Shirlee Latch Electrophysiologist: Graciela Husbands BiV Pacing: 94% 12/30/2021 Weight: 230 lbs 04/23/2022 Weight: 240 lbs 08/29/2022 Office Weight: 241 lbs 02/03/2023 Weight: 235 lbs    Transmission reviewed.     Corvue thoracic impedance suggesting fluid levels returned close to normal.   Prescribed:  Furosemide 20 mg Take 1 tablet (20 mg) by mouth daily.  Potassium 20 mEq take 1 tablet by mouth daily.   Labs: 08/29/2022 Creatinine 1.17, BUN 18, Potassium 4.2, Sodium 134, GFR 53 09/27/2021 Creatinine 1.21, BUN 11, Potassium 4.3, Sodium 136, GFR 51 A complete set of results can be found in Results Review.   Recommendations:  No changes.   Follow-up plan: ICM clinic phone appointment on 06/29/2023.   91 day device clinic remote transmission 07/03/2023.     EP/Cardiology next office visit:  Recall 09/16/2023 with Otilio Saber, PA.  06/17/2023 with Dr Shirlee Latch.     Copy of ICM check sent to Dr. Graciela Husbands.     3 month ICM trend: 05/25/2023.    12-14 Month ICM trend:     Karie Soda, RN 05/27/2023 10:39 AM

## 2023-06-03 ENCOUNTER — Other Ambulatory Visit: Payer: Self-pay | Admitting: Cardiology

## 2023-06-14 DIAGNOSIS — R0789 Other chest pain: Secondary | ICD-10-CM | POA: Diagnosis not present

## 2023-06-15 ENCOUNTER — Telehealth: Payer: Self-pay

## 2023-06-15 NOTE — Telephone Encounter (Signed)
Returned patient call as requested by voice mail message.  She stated she had feeling of bad indigestion last night around 9 or 9:30 and heart rate around 142.  EMS came to check her but nothing was found and she was feeling better when they arrived. She is feeling okay today.  She asked if any fluid retention can be seen on the report and explained she will need to send a manual report to view the fluid levels.  She will send a transmission once once she gets home.  She has an appointment with Dr Shirlee Latch this week.

## 2023-06-15 NOTE — Telephone Encounter (Signed)
Spoke with patient and transmission reviewed.  Advised fluid levels are normal.  No alerts or abnormalities noted.    She will discuss with HF clinic OV later this week.

## 2023-06-17 ENCOUNTER — Encounter (HOSPITAL_COMMUNITY): Payer: Self-pay | Admitting: Cardiology

## 2023-06-17 ENCOUNTER — Ambulatory Visit (HOSPITAL_COMMUNITY)
Admission: RE | Admit: 2023-06-17 | Discharge: 2023-06-17 | Disposition: A | Discharge status: Home / Self Care | Payer: BC Managed Care – PPO | Source: Ambulatory Visit | Attending: Cardiology | Admitting: Cardiology

## 2023-06-17 ENCOUNTER — Ambulatory Visit (HOSPITAL_BASED_OUTPATIENT_CLINIC_OR_DEPARTMENT_OTHER)
Admission: RE | Admit: 2023-06-17 | Discharge: 2023-06-17 | Disposition: A | Discharge status: Home / Self Care | Payer: BC Managed Care – PPO | Source: Ambulatory Visit | Attending: Cardiology | Admitting: Cardiology

## 2023-06-17 VITALS — BP 90/50 | HR 67 | Wt 234.4 lb

## 2023-06-17 DIAGNOSIS — G4733 Obstructive sleep apnea (adult) (pediatric): Secondary | ICD-10-CM | POA: Diagnosis not present

## 2023-06-17 DIAGNOSIS — I5022 Chronic systolic (congestive) heart failure: Secondary | ICD-10-CM | POA: Diagnosis not present

## 2023-06-17 DIAGNOSIS — I428 Other cardiomyopathies: Secondary | ICD-10-CM | POA: Diagnosis not present

## 2023-06-17 DIAGNOSIS — E785 Hyperlipidemia, unspecified: Secondary | ICD-10-CM | POA: Diagnosis not present

## 2023-06-17 DIAGNOSIS — R000 Tachycardia, unspecified: Secondary | ICD-10-CM | POA: Diagnosis not present

## 2023-06-17 DIAGNOSIS — I493 Ventricular premature depolarization: Secondary | ICD-10-CM | POA: Diagnosis not present

## 2023-06-17 DIAGNOSIS — Z79899 Other long term (current) drug therapy: Secondary | ICD-10-CM | POA: Diagnosis not present

## 2023-06-17 DIAGNOSIS — E669 Obesity, unspecified: Secondary | ICD-10-CM | POA: Insufficient documentation

## 2023-06-17 DIAGNOSIS — Z950 Presence of cardiac pacemaker: Secondary | ICD-10-CM | POA: Insufficient documentation

## 2023-06-17 DIAGNOSIS — Z6841 Body Mass Index (BMI) 40.0 and over, adult: Secondary | ICD-10-CM | POA: Insufficient documentation

## 2023-06-17 LAB — BASIC METABOLIC PANEL
Anion gap: 10 (ref 5–15)
BUN: 15 mg/dL (ref 8–23)
CO2: 26 mmol/L (ref 22–32)
Calcium: 9.2 mg/dL (ref 8.9–10.3)
Chloride: 101 mmol/L (ref 98–111)
Creatinine, Ser: 1.02 mg/dL — ABNORMAL HIGH (ref 0.44–1.00)
GFR, Estimated: >60 mL/min (ref >60)
Glucose, Bld: 94 mg/dL (ref 70–99)
Potassium: 4.4 mmol/L (ref 3.5–5.1)
Sodium: 137 mmol/L (ref 135–145)

## 2023-06-17 LAB — LIPID PANEL
Cholesterol: 274 mg/dL — ABNORMAL HIGH (ref 0–200)
HDL: 81 mg/dL (ref >40)
LDL Cholesterol: 165 mg/dL — ABNORMAL HIGH (ref 0–99)
Total CHOL/HDL Ratio: 3.4 {ratio}
Triglycerides: 138 mg/dL (ref <150)
VLDL: 28 mg/dL (ref 0–40)

## 2023-06-17 LAB — ECHOCARDIOGRAM COMPLETE
AR max vel: 1.07 cm2
AV Area VTI: 1.13 cm2
AV Area mean vel: 1.13 cm2
AV Mean grad: 3 mm[Hg]
AV Peak grad: 5.1 mm[Hg]
Ao pk vel: 1.13 m/s
Area-P 1/2: 3.43 cm2
Calc EF: 42 %
MV VTI: 1.57 cm2
S' Lateral: 4.4 cm
Single Plane A2C EF: 42.5 %
Single Plane A4C EF: 42.7 %

## 2023-06-17 LAB — BRAIN NATRIURETIC PEPTIDE: B Natriuretic Peptide: 107.9 pg/mL — ABNORMAL HIGH (ref 0.0–100.0)

## 2023-06-17 MED ORDER — FUROSEMIDE 20 MG PO TABS: 20.0000 mg | ORAL_TABLET | ORAL | Status: DC

## 2023-06-17 MED ORDER — EMPAGLIFLOZIN 10 MG PO TABS: 10.0000 mg | ORAL_TABLET | Freq: Every day | ORAL | 11 refills | Status: DC

## 2023-06-17 MED ORDER — POTASSIUM CHLORIDE CRYS ER 20 MEQ PO TBCR: 20.0000 meq | EXTENDED_RELEASE_TABLET | ORAL | Status: DC

## 2023-06-17 NOTE — Patient Instructions (Signed)
START Jardiance 10 mg daily.   DECREASE Lasix to 20 mg every other day.  DECREASE potassium to 20 mEq ( 1 Tab)  every other day.  Labs done today, your results will be available in MyChart, we will contact you for abnormal readings.  Repeat blood work in 10 days.  Your physician recommends that you schedule a follow-up appointment in: 4 months (March 2025) ** PLEASE CALL THE OFFICE IN [place] TO ARRANGE YOUR FOLLOW UP APPOINTMENT. **  If you have any questions or concerns before your next appointment please send [place] a message through [place] or call our office at [phone].    TO LEAVE A MESSAGE FOR THE NURSE SELECT OPTION 2, PLEASE LEAVE A MESSAGE INCLUDING: YOUR NAME DATE OF BIRTH CALL BACK NUMBER REASON FOR CALL**this is important as we prioritize the call backs  YOU WILL RECEIVE A CALL BACK THE SAME DAY AS LONG AS YOU CALL BEFORE 4:00 PM  At the [facility], you and your health needs are our priority. As part of our continuing mission to provide you with exceptional heart care, we have created designated Provider Care Teams. These Care Teams include your primary Cardiologist (physician) and Advanced Practice Providers (APPs- Physician Assistants and Nurse Practitioners) who all work together to provide you with the care you need, when you need it.   You may see any of the following providers on your designated Care Team at your next follow up: Dr Arvilla Meres Dr Marca Ancona Dr. Dorthula Nettles Dr. Clearnce Hasten Amy Filbert Schilder, NP Robbie Lis, [place] [facility] [place], [place] Brynda Peon, NP [place] Lee, NP Karle Plumber, PharmD   Please be sure to bring in all your medications bottles to every appointment.    Thank you for choosing [facility]

## 2023-06-17 NOTE — Progress Notes (Signed)
*  PRELIMINARY RESULTS* Echocardiogram 2D Echocardiogram has been performed.  Alison Howard 06/17/2023, 10:18 AM

## 2023-06-17 NOTE — Progress Notes (Signed)
ID:  Alison Howard, DOB 06/26/1961, MRN [id number]   Provider location:  Advanced Heart Failure Type of Visit: Established patient  PCP:  Johny Blamer, MD  HF Cardiologist:  Dr. Shirlee Latch   HPI: Alison Howard is a 62 y.o. female who has a history of CHF and VT and was referred by Dr. Graciela Husbands for evaluation of CHF. She has a long history of cardiomyopathy, dating back to at least 2013.  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  She has had VT in the past terminated by [employer].  She has also had up to 22% PVCs in the past, down to 3.1% on 10/19 holter. She is currently on flecainide to suppress the PVCs.  Echo in 12/19 showed severe dilation of the LV with EF 25-30%.    RHC/LHC in 3/20 showed normal filling pressures, CI 2.37, and no significant CAD. CPX in 3/20 showed no significant HF limitation, main problem appeared to be deconditioning.  Echo in 5/22 showed EF 35-40% with basal inferoseptal, basal anteroseptal, and basal inferior akinesis.   She was admitted in 1/23 with CHF and runs of NSVT.  ICD did not discharge. She was diuresed and flecainide was stopped and replaced with amiodarone. No further VT.   Attempted VT ablation in 3/23 but unsuccessful.   Echo was done today and reviewed, EF 40% with mild LV dilation, basal-mid septal akinesis, normal RV, IVC normal.   Today she returns for HF follow up. Weight down 7 lbs.  She had an episode over the weekend when her HR increased to 120s.  She called EMS but they only saw a few PVCs.  She is short of breath walking up stairs.  No dyspnea walking on flat ground.  No lightheadedness or chest pain.    [employer] CRT-D device: no VT/AF, 92% BiV pacing, stable thoracic impedance.    Labs (3/20): K 4.3, creatinine 0.88 Labs (6/20): K 4.4, creatinine 1.02 Labs (11/20): K 4.5 creatinine 0.88, hgb 14.4 Labs (9/21): K 4, creatinine 0.96, LDL 185, HDL 70 Labs (2/22): LDL 173, K 4.2, creatinine 0.86 Labs (1/23): K 4.3, creatinine 0.85, TSH and free T4  elevated, LDL 153 Labs (3/23): K 4.3, creatinine 1.21 Labs (12/23): K 4.2, creatinine 1.0 Labs (8/24): K 4.9, creatinine 1.2  ECG (personally reviewed): NSR BiV pacing  PMH:  1. Chronic systolic CHF: Nonischemic cardiomyopathy. St Jude BiV ICD.  - Echo (8/13): EF 40-45% - Echo (12/15): EF 25-30% - Echo (6/17): EF 35% - Echo (6/18): EF 40-45% - Echo (11/18): EF 35-40% - Echo (12/19): EF 25-30%, severe LV dilation, mild MR.  - RHC/LHC (3/20): no CAD.  Mean RA 4, PA 27/10 mean 16, mean PCWP 5, CI 2.37.  - CPX (3/20): peak VO2 16.9 (97% predicted), VE/VCO2 23, RER 1.14 => no significant HF limitation, primarily limited by deconditioning.  - Cardiac MRI (6/20): Very difficult study due to pacemaker artifact.  EF 34%, delayed enhancement images not interpretable.  - Echo (5/22): EF 35-40% with basal inferoseptal, basal anteroseptal, and basal inferior akinesis.  - Echo (11/24): EF 40% with mild LV dilation, basal-mid septal akinesis, normal RV, IVC normal.  2. PVCs/VT:  - 6/18 holter 22% PVCs - 10/19 holter 3.1% PVCs - s/p unsuccessful VT ablation 3/23. 3. OSA 4. Hyperlipidemia: Unable to tolerate statins, Zetia.  5. Obesity  Current Outpatient Medications  Medication Sig Dispense Refill   bisoprolol (ZEBETA) 5 MG tablet TAKE 1 TABLET BY MOUTH TWICE A DAY  180 tablet 3   citalopram (CELEXA) 40 MG tablet Take 40 mg by mouth daily.      empagliflozin (JARDIANCE) 10 MG TABS tablet Take 1 tablet (10 mg total) by mouth daily before breakfast. 30 tablet 11   ENTRESTO 24-26 MG TAKE 1 TABLET BY MOUTH TWICE A DAY 180 tablet 3   eplerenone (INSPRA) 50 MG tablet Take 1 tablet (50 mg total) by mouth daily. 90 tablet 3   levothyroxine (SYNTHROID) 50 MCG tablet 1 tablet in the morning on an empty stomach Orally Once a day alternating with 75 mcg every other day for 90 days     levothyroxine (SYNTHROID) 75 MCG tablet 1 tablet in the morning on an empty stomach Orally Every other day alternating with  levothyroxine 50 mcg     LORazepam (ATIVAN) 0.5 MG tablet Take 0.5 mg by mouth 2 (two) times daily as needed for anxiety.     magnesium oxide (MAG-OX) 400 (240 Mg) MG tablet PLEASE SEE ATTACHED FOR DETAILED DIRECTIONS 180 tablet 0   pantoprazole (PROTONIX) 40 MG tablet Take 40 mg by mouth 2 (two) times daily.      pravastatin (PRAVACHOL) 80 MG tablet TAKE UP TO 1 TABLET DAILY AS TOLERATED 90 tablet 1   furosemide (LASIX) 20 MG tablet Take 1 tablet (20 mg total) by mouth every other day.     potassium chloride SA (KLOR-CON M20) 20 MEQ tablet Take 1 tablet (20 mEq total) by mouth every other day.     No current facility-administered medications for this encounter.   Allergies:   Ciprofloxacin hcl, Codeine, Morphine and codeine, Sulfa antibiotics, Zetia [ezetimibe], Atorvastatin, Dapagliflozin, Rosuvastatin, Simvastatin, and Welchol [colesevelam]   Social History:  The patient  reports that she has never smoked. She has never used smokeless tobacco. She reports current alcohol use. She reports that she does not use drugs.   Family History:  The patient's family history includes Coronary artery disease in her mother; Heart attack in her mother; Heart disease in her father; Hyperlipidemia in her sister.   ROS:  Please see the history of present illness.   All other systems are personally reviewed and negative.   Exam:   BP (!) 90/50   Pulse 67   Wt 106.3 kg (234 lb 6.4 oz)   SpO2 97%   BMI 42.87 kg/m  General: NAD Neck: No JVD, no thyromegaly or thyroid nodule.  Lungs: Clear to auscultation bilaterally with normal respiratory effort. CV: Nondisplaced PMI.  Heart regular S1/S2, no S3/S4, no murmur.  No peripheral edema.  No carotid bruit.  Normal pedal pulses.  Abdomen: Soft, nontender, no hepatosplenomegaly, no distention.  Skin: Intact without lesions or rashes.  Neurologic: Alert and oriented x 3.  Psych: Normal affect. Extremities: No clubbing or cyanosis.  HEENT: Normal.   Recent  Labs: 06/17/2023: B Natriuretic Peptide 107.9; BUN 15; Creatinine, Ser 1.02; Potassium 4.4; Sodium 137  Personally reviewed   Wt Readings from Last 3 Encounters:  06/17/23 106.3 kg (234 lb 6.4 oz)  03/20/23 107 kg (236 lb)  08/29/22 109.3 kg (241 lb)   ASSESSMENT AND PLAN: 1. Chronic systolic CHF: Long history of nonischemic cardiomyopathy.  Most recent echo in 10/19 with severe LV dilation and EF 25-30%.  Has had frequent PVCs in the past, but most recent holter in 10/19 showed PVC count down to 3.1% on flecainide.  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 RHC/LHC in 3/20 with no significant CAD, normal feeling pressures, and preserved cardiac  output.  CPX showed deconditioning but no significant HF limitation. Cardiac MRI in 6/20 was a very difficult study due to pacemaker artifact, LV EF 34% but LGE was not interpretable.  Echo in 5/22 showed EF 35-40% with basal inferoseptal, basal anteroseptal, and basal inferior akinesis. Echo today showed EF 40% with mild LV dilation, basal-mid septal akinesis, normal RV, IVC normal.  NYHA class II and not volume overloaded by exam or Corvue.  - Continue Entresto 24/26 mg bid (orthostatic symptoms on higher doses).  - Continue bisoprolol 5 mg bid.  - Continue eplerenone 50 mg daily.  - She was unable to tolerate dapagliflozin due to itching but is willing to try Jardiance.  I will start Jardiance 10 mg daily and decrease Lasix to 20 mg every other day and KCl to 20 mEq every other day. BMET/BNP today and BMET in 10 days.  2. PVCs: Count down to 3.1% with holter in 10/19 but EF remained 25-30% by echo. Suspect not primarily a PVC-mediated CMP therefore.  PVCs likely are a consequence of the cardiomyopathy.  She had symptomatic NSVT runs in 1/23 and flecainide was stopped, amiodarone started.  Now off amiodarone.  Unsuccessful attempt at VT ablation in 3/23.  No palpitations.  - Continue bisoprolol.  3. Obesity: Body mass index is 42.87 kg/m. Continue efforts at  diet/exercise for weight loss.  - Will not refer for semaglutide as she does not want to use injectable meds. 4. Hyperlipidemia: Markedly high LDL 153 when last checked. She will not use any injectable meds.   She is on pravastatin 80 mg daily. She cannot tolerate other statins or Zetia.  - Check lipids today.   5. OSA: Unable to tolerate CPAP.  Follow up in 4 months with APP  Signed, Marca Ancona, MD  06/17/2023  [facility] [address] [facility] [place] [address] [phone] (office) [phone] (fax)

## 2023-06-22 ENCOUNTER — Telehealth (HOSPITAL_COMMUNITY): Payer: Self-pay

## 2023-06-22 ENCOUNTER — Other Ambulatory Visit (HOSPITAL_COMMUNITY): Payer: Self-pay

## 2023-06-22 NOTE — Telephone Encounter (Signed)
Advanced Heart Failure Patient Advocate Encounter  Prior authorization for [place] has been submitted and approved. Test billing returns $25 for 30 day supply.  Key: [id number] Effective: 05/23/2023 to 06/21/2024  Spoke to pharmacy to reprocess, confirmed copay. This patient may be eligible for copay savings card that would bring cost down to $10 per 30 days. Unsure if copay card has been provided to patient. Left voicemail for patient.  Burnell Blanks, CPhT Rx Patient Advocate Phone: [phone]

## 2023-06-29 ENCOUNTER — Ambulatory Visit: Payer: BC Managed Care – PPO | Attending: Internal Medicine

## 2023-06-29 ENCOUNTER — Ambulatory Visit (HOSPITAL_COMMUNITY)
Admission: RE | Admit: 2023-06-29 | Discharge: 2023-06-29 | Disposition: A | Discharge status: Home / Self Care | Payer: BC Managed Care – PPO | Source: Ambulatory Visit | Attending: Cardiology | Admitting: Cardiology

## 2023-06-29 DIAGNOSIS — Z95810 Presence of automatic (implantable) cardiac defibrillator: Secondary | ICD-10-CM

## 2023-06-29 DIAGNOSIS — I5022 Chronic systolic (congestive) heart failure: Secondary | ICD-10-CM

## 2023-06-29 LAB — BASIC METABOLIC PANEL
Anion gap: 8 (ref 5–15)
BUN: 9 mg/dL (ref 8–23)
CO2: 26 mmol/L (ref 22–32)
Calcium: 9 mg/dL (ref 8.9–10.3)
Chloride: 104 mmol/L (ref 98–111)
Creatinine, Ser: 0.98 mg/dL (ref 0.44–1.00)
GFR, Estimated: >60 mL/min (ref >60)
Glucose, Bld: 114 mg/dL — ABNORMAL HIGH (ref 70–99)
Potassium: 4.1 mmol/L (ref 3.5–5.1)
Sodium: 138 mmol/L (ref 135–145)

## 2023-07-01 NOTE — Progress Notes (Signed)
EPIC Encounter for ICM Monitoring  Patient Name: Alison Howard is a 62 y.o. female Date: 07/01/2023 Primary Care Physican: Noberto Retort, MD Primary Cardiologist: Shirlee Latch Electrophysiologist: Graciela Husbands BiV Pacing: 93% 12/30/2021 Weight: 230 lbs 04/23/2022 Weight: 240 lbs 08/29/2022 Office Weight: 241 lbs 02/03/2023 Weight: 235 lbs    Spoke with patient and heart failure questions reviewed.  Transmission results reviewed.  Pt asymptomatic for fluid accumulation.  Reports feeling well at this time and voices no complaints.  She reports her urine output has increased since starting Jardiance.   Corvue thoracic impedance suggesting intermittent days with possible fluid accumulation within the last month.   Prescribed:  Furosemide 20 mg Take 1 tablet (20 mg) by mouth every other day (decreased after starting Jardiance).  Potassium 20 mEq take 1 tablet by mouth daily.   Labs: 06/29/2023 Creatinine 0.98, BUN 9, Potassium 4.1, Sodium 138, GFR >60  06/17/2023 Creatinine 1.02, BUN 15, Potassium 4.4, Sodium 137, GFR >60  03/20/2023 Creatinine 1.20, BUN 13, Potassium 4.9, Sodium 140  08/29/2022 Creatinine 1.17, BUN 18, Potassium 4.2, Sodium 134, GFR 53 09/27/2021 Creatinine 1.21, BUN 11, Potassium 4.3, Sodium 136, GFR 51 A complete set of results can be found in Results Review.   Recommendations:  No changes and encouraged to call if experiencing any fluid symptoms.   Follow-up plan: ICM clinic phone appointment on 08/03/2023.   91 day device clinic remote transmission 10/02/2023.     EP/Cardiology next office visit:  Recall 09/16/2023 with Otilio Saber, PA.  Recall 10/15/2023 with Dr Shirlee Latch.     Copy of ICM check sent to Dr. Graciela Husbands.     3 month ICM trend: 06/29/2023.    12-14 Month ICM trend:     Karie Soda, RN 07/01/2023 2:53 PM

## 2023-07-03 ENCOUNTER — Ambulatory Visit (INDEPENDENT_AMBULATORY_CARE_PROVIDER_SITE_OTHER): Payer: BC Managed Care – PPO

## 2023-07-03 DIAGNOSIS — I429 Cardiomyopathy, unspecified: Secondary | ICD-10-CM

## 2023-07-10 LAB — CUP PACEART REMOTE DEVICE CHECK
Battery Remaining Longevity: 32 mo
Battery Remaining Percentage: 35 %
Battery Voltage: 2.92 V
Brady Statistic AP VP Percent: 9.1 %
Brady Statistic AP VS Percent: 1 %
Brady Statistic AS VP Percent: 84 %
Brady Statistic AS VS Percent: 1.6 %
Brady Statistic RA Percent Paced: 3.7 %
Date Time Interrogation Session: 20241209020018
HighPow Impedance: 66 Ohm
HighPow Impedance: 66 Ohm
Implantable Lead Connection Status: 753985
Implantable Lead Connection Status: 753985
Implantable Lead Connection Status: 753985
Implantable Lead Implant Date: 20120529
Implantable Lead Implant Date: 20120529
Implantable Lead Implant Date: 20120529
Implantable Lead Location: 753858
Implantable Lead Location: 753859
Implantable Lead Location: 753860
Implantable Pulse Generator Implant Date: 20190724
Lead Channel Impedance Value: 1250 Ohm
Lead Channel Impedance Value: 430 Ohm
Lead Channel Impedance Value: 550 Ohm
Lead Channel Pacing Threshold Amplitude: 0.75 V
Lead Channel Pacing Threshold Amplitude: 1 V
Lead Channel Pacing Threshold Amplitude: 1.25 V
Lead Channel Pacing Threshold Pulse Width: 0.5 ms
Lead Channel Pacing Threshold Pulse Width: 0.5 ms
Lead Channel Pacing Threshold Pulse Width: 0.5 ms
Lead Channel Sensing Intrinsic Amplitude: 12 mV
Lead Channel Sensing Intrinsic Amplitude: 3.3 mV
Lead Channel Setting Pacing Amplitude: 2 V
Lead Channel Setting Pacing Amplitude: 2 V
Lead Channel Setting Pacing Amplitude: 2.25 V
Lead Channel Setting Pacing Pulse Width: 0.5 ms
Lead Channel Setting Pacing Pulse Width: 0.5 ms
Lead Channel Setting Sensing Sensitivity: 0.5 mV
Pulse Gen Serial Number: 9842506

## 2023-07-14 ENCOUNTER — Inpatient Hospital Stay (HOSPITAL_COMMUNITY)
Admission: EM | Admit: 2023-07-14 | Discharge: 2023-07-18 | DRG: 308 | Disposition: A | Discharge status: Home / Self Care | Payer: BC Managed Care – PPO | Attending: Cardiology | Admitting: Cardiology

## 2023-07-14 ENCOUNTER — Emergency Department (HOSPITAL_COMMUNITY): Payer: BC Managed Care – PPO

## 2023-07-14 ENCOUNTER — Encounter (HOSPITAL_COMMUNITY): Payer: Self-pay

## 2023-07-14 DIAGNOSIS — Z1152 Encounter for screening for COVID-19: Secondary | ICD-10-CM

## 2023-07-14 DIAGNOSIS — J159 Unspecified bacterial pneumonia: Secondary | ICD-10-CM | POA: Diagnosis present

## 2023-07-14 DIAGNOSIS — I10 Essential (primary) hypertension: Secondary | ICD-10-CM | POA: Diagnosis not present

## 2023-07-14 DIAGNOSIS — R5383 Other fatigue: Secondary | ICD-10-CM | POA: Diagnosis not present

## 2023-07-14 DIAGNOSIS — Z8249 Family history of ischemic heart disease and other diseases of the circulatory system: Secondary | ICD-10-CM

## 2023-07-14 DIAGNOSIS — Z7984 Long term (current) use of oral hypoglycemic drugs: Secondary | ICD-10-CM

## 2023-07-14 DIAGNOSIS — E7800 Pure hypercholesterolemia, unspecified: Secondary | ICD-10-CM | POA: Diagnosis present

## 2023-07-14 DIAGNOSIS — Z90710 Acquired absence of both cervix and uterus: Secondary | ICD-10-CM

## 2023-07-14 DIAGNOSIS — I493 Ventricular premature depolarization: Secondary | ICD-10-CM | POA: Diagnosis present

## 2023-07-14 DIAGNOSIS — I472 Ventricular tachycardia, unspecified: Secondary | ICD-10-CM | POA: Diagnosis present

## 2023-07-14 DIAGNOSIS — R9389 Abnormal findings on diagnostic imaging of other specified body structures: Secondary | ICD-10-CM | POA: Diagnosis not present

## 2023-07-14 DIAGNOSIS — Z79899 Other long term (current) drug therapy: Secondary | ICD-10-CM

## 2023-07-14 DIAGNOSIS — J129 Viral pneumonia, unspecified: Secondary | ICD-10-CM | POA: Diagnosis present

## 2023-07-14 DIAGNOSIS — I471 Supraventricular tachycardia, unspecified: Secondary | ICD-10-CM | POA: Diagnosis not present

## 2023-07-14 DIAGNOSIS — Z888 Allergy status to other drugs, medicaments and biological substances status: Secondary | ICD-10-CM

## 2023-07-14 DIAGNOSIS — I4729 Other ventricular tachycardia: Secondary | ICD-10-CM | POA: Diagnosis present

## 2023-07-14 DIAGNOSIS — E66813 Obesity, class 3: Secondary | ICD-10-CM | POA: Diagnosis present

## 2023-07-14 DIAGNOSIS — Z95810 Presence of automatic (implantable) cardiac defibrillator: Secondary | ICD-10-CM

## 2023-07-14 DIAGNOSIS — Z79890 Hormone replacement therapy (postmenopausal): Secondary | ICD-10-CM

## 2023-07-14 DIAGNOSIS — N179 Acute kidney failure, unspecified: Secondary | ICD-10-CM | POA: Diagnosis present

## 2023-07-14 DIAGNOSIS — K219 Gastro-esophageal reflux disease without esophagitis: Secondary | ICD-10-CM | POA: Diagnosis present

## 2023-07-14 DIAGNOSIS — I428 Other cardiomyopathies: Secondary | ICD-10-CM | POA: Diagnosis present

## 2023-07-14 DIAGNOSIS — J9601 Acute respiratory failure with hypoxia: Secondary | ICD-10-CM | POA: Diagnosis not present

## 2023-07-14 DIAGNOSIS — J069 Acute upper respiratory infection, unspecified: Secondary | ICD-10-CM | POA: Diagnosis present

## 2023-07-14 DIAGNOSIS — I5022 Chronic systolic (congestive) heart failure: Secondary | ICD-10-CM | POA: Diagnosis present

## 2023-07-14 DIAGNOSIS — I9589 Other hypotension: Secondary | ICD-10-CM | POA: Diagnosis present

## 2023-07-14 DIAGNOSIS — Z881 Allergy status to other antibiotic agents status: Secondary | ICD-10-CM

## 2023-07-14 DIAGNOSIS — R002 Palpitations: Secondary | ICD-10-CM | POA: Diagnosis not present

## 2023-07-14 DIAGNOSIS — B9729 Other coronavirus as the cause of diseases classified elsewhere: Secondary | ICD-10-CM | POA: Diagnosis present

## 2023-07-14 DIAGNOSIS — G4733 Obstructive sleep apnea (adult) (pediatric): Secondary | ICD-10-CM | POA: Diagnosis present

## 2023-07-14 DIAGNOSIS — Z885 Allergy status to narcotic agent status: Secondary | ICD-10-CM

## 2023-07-14 DIAGNOSIS — J9811 Atelectasis: Secondary | ICD-10-CM | POA: Diagnosis not present

## 2023-07-14 DIAGNOSIS — R000 Tachycardia, unspecified: Secondary | ICD-10-CM | POA: Diagnosis not present

## 2023-07-14 DIAGNOSIS — E039 Hypothyroidism, unspecified: Secondary | ICD-10-CM | POA: Diagnosis present

## 2023-07-14 DIAGNOSIS — Z882 Allergy status to sulfonamides status: Secondary | ICD-10-CM

## 2023-07-14 DIAGNOSIS — Z6841 Body Mass Index (BMI) 40.0 and over, adult: Secondary | ICD-10-CM

## 2023-07-14 LAB — CBC WITH DIFFERENTIAL/PLATELET
Abs Immature Granulocytes: 0.04 10*3/uL (ref 0.00–0.07)
Basophils Absolute: 0.1 10*3/uL (ref 0.0–0.1)
Basophils Relative: 1 %
Eosinophils Absolute: 0 10*3/uL (ref 0.0–0.5)
Eosinophils Relative: 0 %
HCT: 44.3 % (ref 36.0–46.0)
Hemoglobin: 14.3 g/dL (ref 12.0–15.0)
Immature Granulocytes: 0 %
Lymphocytes Relative: 16 %
Lymphs Abs: 2 10*3/uL (ref 0.7–4.0)
MCH: 31.5 pg (ref 26.0–34.0)
MCHC: 32.3 g/dL (ref 30.0–36.0)
MCV: 97.6 fL (ref 80.0–100.0)
Monocytes Absolute: 1.1 10*3/uL — ABNORMAL HIGH (ref 0.1–1.0)
Monocytes Relative: 8 %
Neutro Abs: 9.7 10*3/uL — ABNORMAL HIGH (ref 1.7–7.7)
Neutrophils Relative %: 75 %
Platelets: 335 10*3/uL (ref 150–400)
RBC: 4.54 MIL/uL (ref 3.87–5.11)
RDW: 13.6 % (ref 11.5–15.5)
WBC: 13 10*3/uL — ABNORMAL HIGH (ref 4.0–10.5)
nRBC: 0 % (ref 0.0–0.2)

## 2023-07-14 LAB — MAGNESIUM: Magnesium: 1.8 mg/dL (ref 1.7–2.4)

## 2023-07-14 LAB — COMPREHENSIVE METABOLIC PANEL
ALT: 26 U/L (ref 0–44)
AST: 46 U/L — ABNORMAL HIGH (ref 15–41)
Albumin: 3.1 g/dL — ABNORMAL LOW (ref 3.5–5.0)
Alkaline Phosphatase: 83 U/L (ref 38–126)
Anion gap: 20 — ABNORMAL HIGH (ref 5–15)
BUN: 8 mg/dL (ref 8–23)
CO2: 15 mmol/L — ABNORMAL LOW (ref 22–32)
Calcium: 8.7 mg/dL — ABNORMAL LOW (ref 8.9–10.3)
Chloride: 100 mmol/L (ref 98–111)
Creatinine, Ser: 1.27 mg/dL — ABNORMAL HIGH (ref 0.44–1.00)
GFR, Estimated: 48 mL/min — ABNORMAL LOW (ref >60)
Glucose, Bld: 112 mg/dL — ABNORMAL HIGH (ref 70–99)
Potassium: 4.4 mmol/L (ref 3.5–5.1)
Sodium: 135 mmol/L (ref 135–145)
Total Bilirubin: 1.1 mg/dL (ref <1.2)
Total Protein: 6.8 g/dL (ref 6.5–8.1)

## 2023-07-14 LAB — I-STAT CHEM 8, ED
BUN: 8 mg/dL (ref 8–23)
Calcium, Ion: 1.03 mmol/L — ABNORMAL LOW (ref 1.15–1.40)
Chloride: 102 mmol/L (ref 98–111)
Creatinine, Ser: 1.1 mg/dL — ABNORMAL HIGH (ref 0.44–1.00)
Glucose, Bld: 106 mg/dL — ABNORMAL HIGH (ref 70–99)
HCT: 43 % (ref 36.0–46.0)
Hemoglobin: 14.6 g/dL (ref 12.0–15.0)
Potassium: 4.2 mmol/L (ref 3.5–5.1)
Sodium: 133 mmol/L — ABNORMAL LOW (ref 135–145)
TCO2: 19 mmol/L — ABNORMAL LOW (ref 22–32)

## 2023-07-14 LAB — TROPONIN I (HIGH SENSITIVITY): Troponin I (High Sensitivity): 38 ng/L — ABNORMAL HIGH (ref <18)

## 2023-07-14 LAB — RESP PANEL BY RT-PCR (RSV, FLU A&B, COVID)  RVPGX2
Influenza A by PCR: NEGATIVE
Influenza B by PCR: NEGATIVE
Resp Syncytial Virus by PCR: NEGATIVE
SARS Coronavirus 2 by RT PCR: NEGATIVE

## 2023-07-14 LAB — BRAIN NATRIURETIC PEPTIDE: B Natriuretic Peptide: 939.8 pg/mL — ABNORMAL HIGH (ref 0.0–100.0)

## 2023-07-14 MED ORDER — ETOMIDATE 2 MG/ML IV SOLN
10.0000 mg | Freq: Once | INTRAVENOUS | Status: AC
  Administered 2023-07-14: 10 mg via INTRAVENOUS

## 2023-07-14 NOTE — ED Notes (Signed)
TIMEOUT preformed by Jeraldine Loots MD with this RN, Ester RN Carla Pharmacist, RT and orange zone mid level in room Patient being sedated for synchronized cardioversion. Patient fully alert and oriented verbally consents to procedure.

## 2023-07-14 NOTE — ED Notes (Signed)
Patient's pacemaker interrogated with [employer]

## 2023-07-14 NOTE — ED Provider Notes (Signed)
.  Sedation  Date/Time: 07/14/2023 9:00 PM  Performed by: Gerhard Munch, MD Authorized by: Gerhard Munch, MD   Consent:    Consent obtained:  Written   Consent given by:  Patient   Risks discussed:  Inadequate sedation, nausea, vomiting and respiratory compromise necessitating ventilatory assistance and intubation   Alternatives discussed:  Anxiolysis Universal protocol:    Immediately prior to procedure, a time out was called: yes     Patient identity confirmed:  Verbally with patient Indications:    Procedure performed:  Cardioversion   Procedure necessitating sedation performed by:  Physician performing sedation Pre-sedation assessment:    Time since last food or drink:  4   ASA classification: class 2 - patient with mild systemic disease     Mouth opening:  2 finger widths   Thyromental distance:  2 finger widths   Mallampati score:  III - soft palate, base of uvula visible   Pre-sedation assessments completed and reviewed: airway patency, cardiovascular function, mental status and nausea/vomiting   A pre-sedation assessment was completed prior to the start of the procedure Immediate pre-procedure details:    Reassessment: Patient reassessed immediately prior to procedure     Reviewed: vital signs and relevant labs/tests     Verified: bag valve mask available, emergency equipment available, intubation equipment available, IV patency confirmed, oxygen available and suction available   Procedure details (see MAR for exact dosages):    Preoxygenation:  Nasal cannula   Sedation:  Etomidate   Intended level of sedation: deep   Intra-procedure monitoring:  Blood pressure monitoring, cardiac monitor, continuous pulse oximetry, continuous capnometry, frequent LOC assessments and frequent vital sign checks   Intra-procedure events: none     Total Provider sedation time (minutes):  20 Post-procedure details:   A post-sedation assessment was completed following the completion of the  procedure.   Attendance: Constant attendance by certified staff until patient recovered     Recovery: Patient returned to pre-procedure baseline     Post-sedation assessments completed and reviewed: airway patency, cardiovascular function, mental status, nausea/vomiting, pain level and respiratory function     Patient is stable for discharge or admission: yes     Procedure completion:  Tolerated well, no immediate complications .Cardioversion  Date/Time: 07/14/2023 9:10 PM  Performed by: Gerhard Munch, MD Authorized by: Gerhard Munch, MD   Consent:    Consent obtained:  Written   Consent given by:  Patient   Risks discussed:  Death, induced arrhythmia and pain   Alternatives discussed:  Alternative treatment Universal protocol:    Immediately prior to procedure a time out was called: yes     Patient identity confirmed:  Verbally with patient Pre-procedure details:    Cardioversion basis:  Emergent   Rhythm:  Ventricular tachycardia   Electrode placement:  Anterior-posterior Patient sedated: Yes. Refer to sedation procedure documentation for details of sedation.  Attempt one:    Cardioversion mode:  Synchronous   Waveform:  Biphasic   Shock (Joules):  120   Shock outcome:  Conversion to normal sinus rhythm Post-procedure details:    Patient status:  Awake   Patient tolerance of procedure:  Tolerated well, no immediate complications     Gerhard Munch, MD 07/15/23 0003

## 2023-07-14 NOTE — ED Provider Notes (Signed)
[place] EMERGENCY DEPARTMENT AT [facility] Provider Note   CSN: [id number] Arrival date & time: 07/14/23  2044     History  Chief Complaint  Patient presents with   Palpitations    Patient to ED via EMS with complaint of palpitations x 1 hour. EMS reports giving patient a total of 30 mg adenosine for SVT in route through an IV in left arm.    Alison Howard is a 62 y.o. female.  Patient to ED by EMS who were called due to palpitations, chest pressure, found to be in SVT, rate 190. She recent total 30 mg Adenosine in route by EMS without change or pause in SVT. She reports chest tightness. No vomiting No recent illness or fever.   The history is provided by the patient. No language interpreter was used.  Palpitations      Home Medications Prior to Admission medications   Medication Sig Start Date End Date Taking? Authorizing Provider  bisoprolol (ZEBETA) 5 MG tablet TAKE 1 TABLET BY MOUTH TWICE A DAY 01/05/23   Laurey Morale, MD  citalopram (CELEXA) 40 MG tablet Take 40 mg by mouth daily.     [provider]  empagliflozin (JARDIANCE) 10 MG TABS tablet Take 1 tablet (10 mg total) by mouth daily before breakfast. 06/17/23   Laurey Morale, MD  ENTRESTO 24-26 MG TAKE 1 TABLET BY MOUTH TWICE A DAY 07/16/22   [place], Anderson Malta, FNP  eplerenone (INSPRA) 50 MG tablet Take 1 tablet (50 mg total) by mouth daily. 06/04/23   Laurey Morale, MD  furosemide (LASIX) 20 MG tablet Take 1 tablet (20 mg total) by mouth every other day. 06/17/23   Laurey Morale, MD  levothyroxine (SYNTHROID) 50 MCG tablet 1 tablet in the morning on an empty stomach Orally Once a day alternating with 75 mcg every other day for 90 days 08/15/22   [provider]  levothyroxine (SYNTHROID) 75 MCG tablet 1 tablet in the morning on an empty stomach Orally Every other day alternating with levothyroxine 50 mcg 02/07/22   [provider]  LORazepam (ATIVAN) 0.5 MG tablet Take  0.5 mg by mouth 2 (two) times daily as needed for anxiety.    [provider]  magnesium oxide (MAG-OX) 400 (240 Mg) MG tablet PLEASE SEE ATTACHED FOR DETAILED DIRECTIONS 06/04/23   Laurey Morale, MD  pantoprazole (PROTONIX) 40 MG tablet Take 40 mg by mouth 2 (two) times daily.     [provider]  potassium chloride SA (KLOR-CON M20) 20 MEQ tablet Take 1 tablet (20 mEq total) by mouth every other day. 06/17/23   Laurey Morale, MD  pravastatin (PRAVACHOL) 80 MG tablet TAKE UP TO 1 TABLET DAILY AS TOLERATED 04/07/23   Laurey Morale, MD      Allergies    Ciprofloxacin hcl, Codeine, Morphine and codeine, Sulfa antibiotics, Zetia [ezetimibe], Atorvastatin, Dapagliflozin, Rosuvastatin, Simvastatin, and Welchol [colesevelam]    Review of Systems   Review of Systems  Cardiovascular:  Positive for palpitations.    Physical Exam Updated Vital Signs BP 101/66   Pulse 92   Temp 100.3 F (37.9 C)   Resp (!) 39   Ht 5\' 2"  (1.575 m)   Wt 106 kg   SpO2 91%   BMI 42.74 kg/m  Physical Exam Vitals and nursing note reviewed.  Constitutional:      Appearance: Normal appearance. She is obese.  Cardiovascular:     Rate and Rhythm:  Tachycardia present.     Heart sounds: No murmur heard. Pulmonary:     Effort: Pulmonary effort is normal.  Abdominal:     Tenderness: There is no abdominal tenderness.  Musculoskeletal:        General: Normal range of motion.     Cervical back: Normal range of motion and neck supple.     Right lower leg: No edema.     Left lower leg: No edema.  Skin:    General: Skin is warm and dry.  Neurological:     General: No focal deficit present.     Mental Status: She is alert and oriented to person, place, and time.     Comments: Oriented, normal mentation     ED Results / Procedures / Treatments   Labs (all labs ordered are listed, but only abnormal results are displayed) Labs Reviewed  BRAIN NATRIURETIC PEPTIDE - Abnormal; Notable for  the following components:      Result Value   B Natriuretic Peptide 939.8 (*)    All other components within normal limits  CBC WITH DIFFERENTIAL/PLATELET - Abnormal; Notable for the following components:   WBC 13.0 (*)    Neutro Abs 9.7 (*)    Monocytes Absolute 1.1 (*)    All other components within normal limits  COMPREHENSIVE METABOLIC PANEL - Abnormal; Notable for the following components:   CO2 15 (*)    Glucose, Bld 112 (*)    Creatinine, Ser 1.27 (*)    Calcium 8.7 (*)    Albumin 3.1 (*)    AST 46 (*)    GFR, Estimated 48 (*)    Anion gap 20 (*)    All other components within normal limits  I-STAT CHEM 8, ED - Abnormal; Notable for the following components:   Sodium 133 (*)    Creatinine, Ser 1.10 (*)    Glucose, Bld 106 (*)    Calcium, Ion 1.03 (*)    TCO2 19 (*)    All other components within normal limits  TROPONIN I (HIGH SENSITIVITY) - Abnormal; Notable for the following components:   Troponin I (High Sensitivity) 38 (*)    All other components within normal limits  RESP PANEL BY RT-PCR (RSV, FLU A&B, COVID)  RVPGX2  MAGNESIUM  TROPONIN I (HIGH SENSITIVITY)   Results for orders placed or performed during the hospital encounter of 07/14/23  Magnesium   Collection Time: 07/14/23  9:04 PM  Result Value Ref Range   Magnesium 1.8 1.7 - 2.4 mg/dL  Brain natriuretic peptide   Collection Time: 07/14/23  9:04 PM  Result Value Ref Range   B Natriuretic Peptide 939.8 (H) 0.0 - 100.0 pg/mL  CBC with Differential   Collection Time: 07/14/23  9:04 PM  Result Value Ref Range   WBC 13.0 (H) 4.0 - 10.5 K/uL   RBC 4.54 3.87 - 5.11 MIL/uL   Hemoglobin 14.3 12.0 - 15.0 g/dL   HCT 57.8 46.9 - 62.9 %   MCV 97.6 80.0 - 100.0 fL   MCH 31.5 26.0 - 34.0 pg   MCHC 32.3 30.0 - 36.0 g/dL   RDW 52.8 41.3 - 24.4 %   Platelets 335 150 - 400 K/uL   nRBC 0.0 0.0 - 0.2 %   Neutrophils Relative % 75 %   Neutro Abs 9.7 (H) 1.7 - 7.7 K/uL   Lymphocytes Relative 16 %   Lymphs Abs 2.0  0.7 - 4.0 K/uL   Monocytes Relative 8 %   Monocytes  Absolute 1.1 (H) 0.1 - 1.0 K/uL   Eosinophils Relative 0 %   Eosinophils Absolute 0.0 0.0 - 0.5 K/uL   Basophils Relative 1 %   Basophils Absolute 0.1 0.0 - 0.1 K/uL   Immature Granulocytes 0 %   Abs Immature Granulocytes 0.04 0.00 - 0.07 K/uL  Comprehensive metabolic panel   Collection Time: 07/14/23  9:04 PM  Result Value Ref Range   Sodium 135 135 - 145 mmol/L   Potassium 4.4 3.5 - 5.1 mmol/L   Chloride 100 98 - 111 mmol/L   CO2 15 (L) 22 - 32 mmol/L   Glucose, Bld 112 (H) 70 - 99 mg/dL   BUN 8 8 - 23 mg/dL   Creatinine, Ser 1.91 (H) 0.44 - 1.00 mg/dL   Calcium 8.7 (L) 8.9 - 10.3 mg/dL   Total Protein 6.8 6.5 - 8.1 g/dL   Albumin 3.1 (L) 3.5 - 5.0 g/dL   AST 46 (H) 15 - 41 U/L   ALT 26 0 - 44 U/L   Alkaline Phosphatase 83 38 - 126 U/L   Total Bilirubin 1.1 <1.2 mg/dL   GFR, Estimated 48 (L) >60 mL/min   Anion gap 20 (H) 5 - 15  Troponin I (High Sensitivity)   Collection Time: 07/14/23  9:04 PM  Result Value Ref Range   Troponin I (High Sensitivity) 38 (H) <18 ng/L  I-stat chem 8, ED (not at [facility], [facility] or [facility])   Collection Time: 07/14/23  9:08 PM  Result Value Ref Range   Sodium 133 (L) 135 - 145 mmol/L   Potassium 4.2 3.5 - 5.1 mmol/L   Chloride 102 98 - 111 mmol/L   BUN 8 8 - 23 mg/dL   Creatinine, Ser 4.78 (H) 0.44 - 1.00 mg/dL   Glucose, Bld 295 (H) 70 - 99 mg/dL   Calcium, Ion 6.21 (L) 1.15 - 1.40 mmol/L   TCO2 19 (L) 22 - 32 mmol/L   Hemoglobin 14.6 12.0 - 15.0 g/dL   HCT 30.8 65.7 - 84.6 %  Resp panel by RT-PCR (RSV, Flu A&B, Covid) Anterior Nasal Swab   Collection Time: 07/14/23  9:32 PM   Specimen: Anterior Nasal Swab  Result Value Ref Range   SARS Coronavirus 2 by RT PCR NEGATIVE NEGATIVE   Influenza A by PCR NEGATIVE NEGATIVE   Influenza B by PCR NEGATIVE NEGATIVE   Resp Syncytial Virus by PCR NEGATIVE NEGATIVE    EKG EKG Interpretation Date/Time:  Tuesday July 14 2023 21:03:07  EST Ventricular Rate:  115 PR Interval:  111 QRS Duration:  143 QT Interval:  377 QTC Calculation: 522 R Axis:   265  Text Interpretation: Ectopic atrial tachycardia, unifocal Consider dextrocardia Artifact in lead(s) I III aVR aVL V4 V5 V6 Confirmed by Zadie Rhine ([id number]) on 07/14/2023 11:44:13 PM  Radiology DG Chest Portable 1 View Result Date: 07/14/2023 CLINICAL DATA:  SVT EXAM: PORTABLE CHEST 1 VIEW COMPARISON:  08/06/2021 FINDINGS: Left-sided pacing device as before. Pacer patches obscure portions of the chest. Similar elevation right diaphragm. Subsegmental atelectasis right lower lung. No consolidation, pleural effusion or pneumothorax. Difficult to exclude patchy infiltrate in the left upper lobe. IMPRESSION: 1. Difficult to exclude patchy infiltrate in the left upper lobe. 2. Similar elevation right diaphragm with subsegmental atelectasis right lower lung. Electronically Signed   By: Jasmine Pang M.D.   On: 07/14/2023 23:08    Procedures Procedures    Medications Ordered in ED Medications  etomidate (AMIDATE) injection 10 mg (10 mg Intravenous  Given 07/14/23 2059)    ED Course/ Medical Decision Making/ A&P                                 Medical Decision Making This patient presents to the ED for concern of palpitations, this involves an extensive number of treatment options, and is a complaint that carries with it a high risk of complications and morbidity.  The differential diagnosis includes SVT, A fib,    Co morbidities that complicate the patient evaluation  History of SVT, pacemaker   Additional history obtained:  Additional history and/or information obtained from chart review, notable for EMR   Lab Tests:  I Ordered, and personally interpreted labs.  The pertinent results include:  Magnesium 1.8, BNP 936 Leukocytosis 13, normal hgb Cr. 1.27     Imaging Studies ordered:  I ordered imaging studies including CXR Per radiology interpretation  ?patchy infiltrate RUL    Cardiac Monitoring:  The patient was maintained on a cardiac monitor.  I personally viewed and interpreted the cardiac monitored which showed an underlying rhythm of: patient presented with HR 190, appears to be SVT     Critical Interventions:  Cardioversion per Dr. 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 Priscille Loveless documentation re: cardioversion)   Reevaluation:  After the interventions noted above, I reevaluated the patient and found that they have :resolved   Social Determinants of Health:  Good family support   Disposition:  After consideration of the diagnostic results and the patients response to treatment, I feel that the patient would benefit from Admit to cardiology service.   Amount and/or Complexity of Data Reviewed Labs: ordered. Radiology: ordered.  Risk Prescription drug management. Decision regarding hospitalization.           Final Clinical Impression(s) / ED Diagnoses Final diagnoses:  SVT (supraventricular tachycardia) [facility])    Rx / DC Orders ED Discharge Orders     None         Danne Harbor 07/14/23 2347    Gerhard Munch, MD 07/14/23 2356

## 2023-07-15 ENCOUNTER — Inpatient Hospital Stay (HOSPITAL_COMMUNITY): Payer: BC Managed Care – PPO

## 2023-07-15 DIAGNOSIS — I472 Ventricular tachycardia, unspecified: Secondary | ICD-10-CM | POA: Diagnosis not present

## 2023-07-15 DIAGNOSIS — Z881 Allergy status to other antibiotic agents status: Secondary | ICD-10-CM | POA: Diagnosis not present

## 2023-07-15 DIAGNOSIS — E7800 Pure hypercholesterolemia, unspecified: Secondary | ICD-10-CM | POA: Diagnosis present

## 2023-07-15 DIAGNOSIS — I4729 Other ventricular tachycardia: Secondary | ICD-10-CM | POA: Diagnosis present

## 2023-07-15 DIAGNOSIS — Z950 Presence of cardiac pacemaker: Secondary | ICD-10-CM | POA: Diagnosis not present

## 2023-07-15 DIAGNOSIS — I471 Supraventricular tachycardia, unspecified: Secondary | ICD-10-CM | POA: Diagnosis present

## 2023-07-15 DIAGNOSIS — J9601 Acute respiratory failure with hypoxia: Secondary | ICD-10-CM | POA: Diagnosis not present

## 2023-07-15 DIAGNOSIS — B9729 Other coronavirus as the cause of diseases classified elsewhere: Secondary | ICD-10-CM | POA: Diagnosis present

## 2023-07-15 DIAGNOSIS — Z885 Allergy status to narcotic agent status: Secondary | ICD-10-CM | POA: Diagnosis not present

## 2023-07-15 DIAGNOSIS — E039 Hypothyroidism, unspecified: Secondary | ICD-10-CM | POA: Diagnosis present

## 2023-07-15 DIAGNOSIS — Z888 Allergy status to other drugs, medicaments and biological substances status: Secondary | ICD-10-CM | POA: Diagnosis not present

## 2023-07-15 DIAGNOSIS — I5022 Chronic systolic (congestive) heart failure: Secondary | ICD-10-CM

## 2023-07-15 DIAGNOSIS — Z95810 Presence of automatic (implantable) cardiac defibrillator: Secondary | ICD-10-CM | POA: Diagnosis not present

## 2023-07-15 DIAGNOSIS — Z79890 Hormone replacement therapy (postmenopausal): Secondary | ICD-10-CM | POA: Diagnosis not present

## 2023-07-15 DIAGNOSIS — E66813 Obesity, class 3: Secondary | ICD-10-CM | POA: Diagnosis present

## 2023-07-15 DIAGNOSIS — I493 Ventricular premature depolarization: Secondary | ICD-10-CM | POA: Diagnosis present

## 2023-07-15 DIAGNOSIS — J984 Other disorders of lung: Secondary | ICD-10-CM | POA: Diagnosis not present

## 2023-07-15 DIAGNOSIS — Z882 Allergy status to sulfonamides status: Secondary | ICD-10-CM | POA: Diagnosis not present

## 2023-07-15 DIAGNOSIS — R918 Other nonspecific abnormal finding of lung field: Secondary | ICD-10-CM | POA: Diagnosis not present

## 2023-07-15 DIAGNOSIS — Z6841 Body Mass Index (BMI) 40.0 and over, adult: Secondary | ICD-10-CM | POA: Diagnosis not present

## 2023-07-15 DIAGNOSIS — Z1152 Encounter for screening for COVID-19: Secondary | ICD-10-CM | POA: Diagnosis not present

## 2023-07-15 DIAGNOSIS — J159 Unspecified bacterial pneumonia: Secondary | ICD-10-CM | POA: Diagnosis present

## 2023-07-15 DIAGNOSIS — I428 Other cardiomyopathies: Secondary | ICD-10-CM | POA: Diagnosis present

## 2023-07-15 DIAGNOSIS — N179 Acute kidney failure, unspecified: Secondary | ICD-10-CM | POA: Diagnosis present

## 2023-07-15 DIAGNOSIS — J129 Viral pneumonia, unspecified: Secondary | ICD-10-CM | POA: Diagnosis present

## 2023-07-15 DIAGNOSIS — J069 Acute upper respiratory infection, unspecified: Secondary | ICD-10-CM | POA: Diagnosis present

## 2023-07-15 DIAGNOSIS — Z79899 Other long term (current) drug therapy: Secondary | ICD-10-CM | POA: Diagnosis not present

## 2023-07-15 LAB — ECHOCARDIOGRAM LIMITED
Calc EF: 52.7 %
Est EF: 40
Height: 62 in
S' Lateral: 4.7 cm
Single Plane A2C EF: 49.7 %
Single Plane A4C EF: 52.6 %
Weight: 3739 [oz_av]

## 2023-07-15 LAB — CBC
HCT: 39.2 % (ref 36.0–46.0)
Hemoglobin: 13 g/dL (ref 12.0–15.0)
MCH: 31.5 pg (ref 26.0–34.0)
MCHC: 33.2 g/dL (ref 30.0–36.0)
MCV: 94.9 fL (ref 80.0–100.0)
Platelets: 319 10*3/uL (ref 150–400)
RBC: 4.13 MIL/uL (ref 3.87–5.11)
RDW: 13.6 % (ref 11.5–15.5)
WBC: 14.2 10*3/uL — ABNORMAL HIGH (ref 4.0–10.5)
nRBC: 0 % (ref 0.0–0.2)

## 2023-07-15 LAB — TSH: TSH: 4.307 u[IU]/mL (ref 0.350–4.500)

## 2023-07-15 LAB — RESPIRATORY PANEL BY PCR

## 2023-07-15 LAB — MAGNESIUM: Magnesium: 1.8 mg/dL (ref 1.7–2.4)

## 2023-07-15 LAB — MRSA NEXT GEN BY PCR, NASAL: MRSA by PCR Next Gen: NOT DETECTED

## 2023-07-15 LAB — CREATININE, SERUM
Creatinine, Ser: 1.16 mg/dL — ABNORMAL HIGH (ref 0.44–1.00)
GFR, Estimated: 53 mL/min — ABNORMAL LOW (ref >60)

## 2023-07-15 LAB — PROCALCITONIN: Procalcitonin: 0.42 ng/mL

## 2023-07-15 LAB — TROPONIN I (HIGH SENSITIVITY): Troponin I (High Sensitivity): 57 ng/L — ABNORMAL HIGH (ref <18)

## 2023-07-15 LAB — HIV ANTIBODY (ROUTINE TESTING W REFLEX): HIV Screen 4th Generation wRfx: NONREACTIVE

## 2023-07-15 MED ORDER — LEVOTHYROXINE SODIUM 75 MCG PO TABS
75.0000 ug | ORAL_TABLET | ORAL | Status: DC
  Administered 2023-07-16 – 2023-07-18 (×2): 75 ug via ORAL
  Filled 2023-07-15 (×2): qty 1

## 2023-07-15 MED ORDER — ONDANSETRON HCL 4 MG/2ML IJ SOLN
4.0000 mg | Freq: Four times a day (QID) | INTRAMUSCULAR | Status: DC | PRN
Start: 2023-07-15 — End: 2023-07-18

## 2023-07-15 MED ORDER — EMPAGLIFLOZIN 10 MG PO TABS
10.0000 mg | ORAL_TABLET | Freq: Every day | ORAL | Status: DC
Start: 2023-07-15 — End: 2023-07-18
  Administered 2023-07-15 – 2023-07-18 (×4): 10 mg via ORAL
  Filled 2023-07-15 (×4): qty 1

## 2023-07-15 MED ORDER — GUAIFENESIN ER 600 MG PO TB12
600.0000 mg | ORAL_TABLET | Freq: Two times a day (BID) | ORAL | Status: DC
  Administered 2023-07-15 – 2023-07-18 (×8): 600 mg via ORAL
  Filled 2023-07-15 (×8): qty 1

## 2023-07-15 MED ORDER — BISOPROLOL FUMARATE 5 MG PO TABS: 5.0000 mg | ORAL_TABLET | Freq: Two times a day (BID) | ORAL | Status: DC

## 2023-07-15 MED ORDER — LEVOTHYROXINE SODIUM 50 MCG PO TABS
50.0000 ug | ORAL_TABLET | ORAL | Status: DC
  Administered 2023-07-15 – 2023-07-17 (×2): 50 ug via ORAL
  Filled 2023-07-15: qty 2
  Filled 2023-07-15: qty 1

## 2023-07-15 MED ORDER — SODIUM CHLORIDE 0.9 % IV SOLN
2.0000 g | INTRAVENOUS | Status: DC
  Administered 2023-07-15 – 2023-07-18 (×4): 2 g via INTRAVENOUS
  Filled 2023-07-15 (×5): qty 20

## 2023-07-15 MED ORDER — BISOPROLOL FUMARATE 5 MG PO TABS
5.0000 mg | ORAL_TABLET | Freq: Two times a day (BID) | ORAL | Status: DC
  Administered 2023-07-15 – 2023-07-18 (×7): 5 mg via ORAL
  Filled 2023-07-15 (×8): qty 1

## 2023-07-15 MED ORDER — SACUBITRIL-VALSARTAN 24-26 MG PO TABS
1.0000 | ORAL_TABLET | Freq: Two times a day (BID) | ORAL | Status: DC
  Administered 2023-07-15 – 2023-07-18 (×7): 1 via ORAL
  Filled 2023-07-15 (×8): qty 1

## 2023-07-15 MED ORDER — PRAVASTATIN SODIUM 40 MG PO TABS
80.0000 mg | ORAL_TABLET | Freq: Every day | ORAL | Status: DC
  Administered 2023-07-15 – 2023-07-18 (×4): 80 mg via ORAL
  Filled 2023-07-15 (×5): qty 2

## 2023-07-15 MED ORDER — DOXYCYCLINE HYCLATE 100 MG PO TABS
100.0000 mg | ORAL_TABLET | Freq: Two times a day (BID) | ORAL | Status: DC
Start: 2023-07-15 — End: 2023-07-18
  Administered 2023-07-15 – 2023-07-18 (×7): 100 mg via ORAL
  Filled 2023-07-15 (×7): qty 1

## 2023-07-15 MED ORDER — LEVOTHYROXINE SODIUM 25 MCG PO TABS: 50.0000 ug | ORAL_TABLET | ORAL | Status: DC

## 2023-07-15 MED ORDER — CITALOPRAM HYDROBROMIDE 20 MG PO TABS
40.0000 mg | ORAL_TABLET | Freq: Every day | ORAL | Status: DC
  Administered 2023-07-15 – 2023-07-16 (×2): 40 mg via ORAL
  Filled 2023-07-15: qty 2
  Filled 2023-07-15: qty 4

## 2023-07-15 MED ORDER — ACETAMINOPHEN 325 MG PO TABS
650.0000 mg | ORAL_TABLET | ORAL | Status: DC | PRN
  Administered 2023-07-15 – 2023-07-17 (×5): 650 mg via ORAL
  Filled 2023-07-15 (×5): qty 2

## 2023-07-15 MED ORDER — ENOXAPARIN SODIUM 40 MG/0.4ML IJ SOSY
40.0000 mg | PREFILLED_SYRINGE | INTRAMUSCULAR | Status: DC
  Administered 2023-07-15 – 2023-07-18 (×4): 40 mg via SUBCUTANEOUS
  Filled 2023-07-15 (×4): qty 0.4

## 2023-07-15 MED ORDER — PANTOPRAZOLE SODIUM 40 MG PO TBEC
40.0000 mg | DELAYED_RELEASE_TABLET | Freq: Two times a day (BID) | ORAL | Status: DC
  Administered 2023-07-15 – 2023-07-18 (×7): 40 mg via ORAL
  Filled 2023-07-15 (×7): qty 1

## 2023-07-15 MED ORDER — SODIUM CHLORIDE 0.9% FLUSH
3.0000 mL | Freq: Two times a day (BID) | INTRAVENOUS | Status: DC
  Administered 2023-07-15 – 2023-07-17 (×4): 3 mL via INTRAVENOUS
  Administered 2023-07-17: 10 mL via INTRAVENOUS
  Administered 2023-07-18: 3 mL via INTRAVENOUS

## 2023-07-15 MED ORDER — SPIRONOLACTONE 25 MG PO TABS
25.0000 mg | ORAL_TABLET | Freq: Every day | ORAL | Status: DC
  Administered 2023-07-15 – 2023-07-18 (×4): 25 mg via ORAL
  Filled 2023-07-15 (×2): qty 1
  Filled 2023-07-15: qty 2
  Filled 2023-07-15: qty 1

## 2023-07-15 MED ORDER — MAGNESIUM SULFATE 2 GM/50ML IV SOLN
2.0000 g | Freq: Once | INTRAVENOUS | Status: AC
  Administered 2023-07-15: 2 g via INTRAVENOUS
  Filled 2023-07-15: qty 50

## 2023-07-15 MED ORDER — BENZONATATE 100 MG PO CAPS
200.0000 mg | ORAL_CAPSULE | Freq: Three times a day (TID) | ORAL | Status: DC | PRN
  Administered 2023-07-15: 200 mg via ORAL
  Filled 2023-07-15: qty 2

## 2023-07-15 MED ORDER — SODIUM CHLORIDE 0.9% FLUSH: 3.0000 mL | INTRAVENOUS | Status: DC | PRN

## 2023-07-15 MED ORDER — LIDOCAINE IN D5W 4-5 MG/ML-% IV SOLN
1.0000 mg/min | INTRAVENOUS | Status: AC
  Administered 2023-07-15: 1 mg/min via INTRAVENOUS
  Filled 2023-07-15 (×2): qty 500

## 2023-07-15 MED ORDER — SODIUM CHLORIDE 0.9 % IV SOLN: 250.0000 mL | INTRAVENOUS | Status: AC | PRN

## 2023-07-15 NOTE — Progress Notes (Signed)
eLink Physician-Brief Progress Note Patient Name: Brandan Sockwell DOB: [birth year] MRN: [id number]   Date of Service  07/15/2023  HPI/Events of Note  Patient admitted with wide complex tachycardia in the context of a respiratory illness with CXR positive for lung infiltrates r/o pneumonia, patient has a history of NICM with systolic dysfunction, and ventricular tachycardia, she has been started on Lidocaine gtt by cardiology and is receiving antibiotics for pneumonia.  eICU Interventions  New Patient Evaluation.        Thomasene Lot Necie Wilcoxson 07/15/2023, 8:12 PM

## 2023-07-15 NOTE — H&P (Signed)
Cardiology Admission History and Physical   Patient ID: Alison Howard MRN: [id number]; DOB: Apr 13, 1961   Admission date: 07/14/2023  PCP:  Noberto Retort, MD   [facility] Providers Cardiologist:  None  Electrophysiologist:  Sherryl Manges, MD  Advanced Heart Failure:  Marca Ancona, MD       Chief Complaint:  heart racing  Patient Profile:   Alison Howard is a 62 y.o. female with NICM s/p SJM CRT-D, prior VT on flecainide, OSA, and GERD  who is being seen 07/15/2023 for the evaluation of sustained VT.  History of Present Illness:   Alison Howard is over a 10-year history of nonischemic cardiomyopathy.  Her last ischemic evaluation was in 09/2018 with no significant CAD.  She has a [employer] CRT device and has had VT in the past that was terminated by ATP with a history of high PVC burden previously treated with flecainide which was transition to amiodarone which was complicated by hyperthyroidism.  She underwent unsuccessful VT ablation in 09/2021.  She has not had recent clinical VT.  Last echocardiogram in 05/2023 with LVEF 40%.  Current GDMT includes Entresto 24/26 mg twice daily SEs orthostatic symptoms at higher doses), bisoprolol 5 mg twice daily, eplerenone 50 mg twice daily Jardiance 10 mg daily, and diuretics with Lasix 20 mg every other day.  Not currently on amiodarone or flecainide.  She states that she has had 3 days of upper respiratory tract infection-like symptoms including cough, congestion, runny nose, sore throat.  She has been overall feeling fatigued secondary to the symptoms.  Has had poor appetite and mild nausea.  2 episodes of very small emesis related to coughing.  Denies any new lower extremity edema.  Reports mild orthopnea only during these last 3 days.  No change in exercise tolerance in the last few weeks.  Has been taking her GDMT as prescribed.  Around 7 PM on 07/14/2023, she noted that her heart rate abruptly increased to 190 which was viewed on  her Apple Watch.  This made her feel very unwell and she immediately called EMS.  Per report, EMS trialed adenosine which was unsuccessful.  Given her severe symptoms with heart rate in the 190s, she was cardioverted in the emergency room.  At time of my assessment heart rate in the 90s, blood pressure 90s/60s (she reports this is close to her baseline), requiring no supplemental oxygen.  CBC with leukocytosis to 13, normal hemoglobin and platelet.  CMP with K4.4, creatinine 1.27.  BNP 939 (elevated from prior).  High-sensitivity troponin 38.  Flu A/B and COVID-19 negative.  Possible patchy infiltrate in the left upper lobe on CXR.   Past Medical History:  Diagnosis Date   CHF (congestive heart failure) (HCC)    Chronic systolic heart failure (HCC) 04/25/2013   GERD (gastroesophageal reflux disease)    Hypercholesteremia    LBBB (left bundle branch block)    Morbid obesity (HCC) 12/28/2014   NICM (nonischemic cardiomyopathy) (HCC)    a. s/p STJ CRTD   OSA (obstructive sleep apnea) 12/28/2014   Severe with AHI 27 events per hour on average and the highest AHI was 40 events per hour   Ventricular tachycardia (HCC)    a. s/p appropriate ICD therapy    Past Surgical History:  Procedure Laterality Date   BIV ICD GENERATOR CHANGEOUT N/A 02/17/2018   Procedure: BIV ICD GENERATOR CHANGEOUT;  Surgeon: Duke Salvia, MD;  Location: [facility] INVASIVE CV LAB;  Service: Cardiovascular;  Laterality:  N/A;   CARDIAC DEFIBRILLATOR PLACEMENT  2012   a. STJ CRTD implanted for NICM, CHF   RIGHT/LEFT HEART CATH AND CORONARY ANGIOGRAPHY N/A 10/07/2018   Procedure: RIGHT/LEFT HEART CATH AND CORONARY ANGIOGRAPHY;  Surgeon: Laurey Morale, MD;  Location: [facility] INVASIVE CV LAB;  Service: Cardiovascular;  Laterality: N/A;   TOTAL ABDOMINAL HYSTERECTOMY     V TACH ABLATION N/A 10/01/2021   Procedure: V TACH ABLATION;  Surgeon: Marinus Maw, MD;  Location: [facility] INVASIVE CV LAB;  Service: Cardiovascular;  Laterality: N/A;      Medications Prior to Admission: Prior to Admission medications   Medication Sig Start Date End Date Taking? Authorizing Provider  bisoprolol (ZEBETA) 5 MG tablet TAKE 1 TABLET BY MOUTH TWICE A DAY 01/05/23  Yes Laurey Morale, MD  citalopram (CELEXA) 40 MG tablet Take 40 mg by mouth daily.    Yes [provider]  empagliflozin (JARDIANCE) 10 MG TABS tablet Take 1 tablet (10 mg total) by mouth daily before breakfast. 06/17/23  Yes Laurey Morale, MD  ENTRESTO 24-26 MG TAKE 1 TABLET BY MOUTH TWICE A DAY 07/16/22  Yes Milford, Jessica M, FNP  eplerenone (INSPRA) 50 MG tablet Take 1 tablet (50 mg total) by mouth daily. 06/04/23  Yes Laurey Morale, MD  furosemide (LASIX) 20 MG tablet Take 1 tablet (20 mg total) by mouth every other day. 06/17/23  Yes Laurey Morale, MD  levothyroxine (SYNTHROID) 50 MCG tablet 1 tablet in the morning on an empty stomach Orally Once a day alternating with 75 mcg every other day for 90 days 08/15/22  Yes [provider]  levothyroxine (SYNTHROID) 75 MCG tablet 1 tablet in the morning on an empty stomach Orally Every other day alternating with levothyroxine 50 mcg 02/07/22  Yes [provider]  LORazepam (ATIVAN) 0.5 MG tablet Take 0.5 mg by mouth 2 (two) times daily as needed for anxiety.   Yes [provider]  magnesium oxide (MAG-OX) 400 (240 Mg) MG tablet PLEASE SEE ATTACHED FOR DETAILED DIRECTIONS 06/04/23  Yes Laurey Morale, MD  pantoprazole (PROTONIX) 40 MG tablet Take 40 mg by mouth 2 (two) times daily.    Yes [provider]  potassium chloride SA (KLOR-CON M20) 20 MEQ tablet Take 1 tablet (20 mEq total) by mouth every other day. 06/17/23  Yes Laurey Morale, MD  pravastatin (PRAVACHOL) 80 MG tablet TAKE UP TO 1 TABLET DAILY AS TOLERATED 04/07/23  Yes Laurey Morale, MD     Allergies:    Allergies  Allergen Reactions   Ciprofloxacin Hcl Anaphylaxis   Codeine Anaphylaxis and Nausea And Vomiting    Morphine And Codeine Anaphylaxis   Sulfa Antibiotics Anaphylaxis   Zetia [Ezetimibe] Rash    Rash and muscle aches   Atorvastatin     Myalgias    Dapagliflozin Hives   Rosuvastatin     Myalgias   Simvastatin     Myalgias    Welchol [Colesevelam]     myalgias    Social History:   Social History   Socioeconomic History   Marital status: Single    Spouse name: Not on file   Number of children: 0   Years of education: Not on file   Highest education level: Not on file  Occupational History   Not on file  Tobacco Use   Smoking status: Never   Smokeless tobacco: Never  Vaping Use   Vaping status: Never Used  Substance and Sexual Activity  Alcohol use: Yes   Drug use: No   Sexual activity: Not on file  Other Topics Concern   Not on file  Social History Narrative   Not on file   Social Drivers of Health   Financial Resource Strain: Not on file  Food Insecurity: Not on file  Transportation Needs: Not on file  Physical Activity: Not on file  Stress: Not on file  Social Connections: Not on file  Intimate Partner Violence: Not on file    Family History:   The patient's family history includes Coronary artery disease in her mother; Heart attack in her mother; Heart disease in her father; Hyperlipidemia in her sister.    ROS:  Please see the history of present illness.  All other ROS reviewed and negative.     Physical Exam/Data:   Vitals:   07/14/23 2117 07/14/23 2130 07/14/23 2159 07/14/23 2315  BP: (!) 97/53 (!) 105/58 101/84 101/66  Pulse: (!) 109 (!) 103 (!) 103 92  Resp: (!) 31 (!) 46 (!) 28 (!) 39  Temp:      SpO2: 98% 95% 95% 91%  Weight:      Height:       No intake or output data in the 24 hours ending 07/15/23 0017    07/14/2023    8:46 PM 06/17/2023   10:02 AM 03/20/2023    8:37 AM  Last 3 Weights  Weight (lbs) 233 lb 11 oz 234 lb 6.4 oz 236 lb  Weight (kg) 106 kg 106.323 kg 107.049 kg     Body mass index is 42.74 kg/m.  General: Mildly  ill-appearing HEENT: normal Neck: no JVD Vascular: No carotid bruits; Distal pulses 2+ bilaterally   Cardiac:  normal S1, S2; RRR; no murmur  Lungs:  clear to auscultation bilaterally, no wheezing, rhonchi or rales  Abd: soft, nontender, no hepatomegaly  Ext: no edema Musculoskeletal:  No deformities, BUE and BLE strength normal and equal Skin: warm and dry  Neuro:  CNs 2-12 intact, no focal abnormalities noted Psych:  Normal affect    EKG:  The ECG that was done 07/15/2023 was personally reviewed and demonstrates wide-complex tachycardia with ventricular rates in the 190s  Relevant CV Studies: - Echo (8/13): EF 40-45% - Echo (12/15): EF 25-30% - Echo (6/17): EF 35% - Echo (6/18): EF 40-45% - Echo (11/18): EF 35-40% - Echo (12/19): EF 25-30%, severe LV dilation, mild MR.  - RHC/LHC (3/20): no CAD.  Mean RA 4, PA 27/10 mean 16, mean PCWP 5, CI 2.37.  - CPX (3/20): peak VO2 16.9 (97% predicted), VE/VCO2 23, RER 1.14 => no significant HF limitation, primarily limited by deconditioning.  - Cardiac MRI (6/20): Very difficult study due to pacemaker artifact.  EF 34%, delayed enhancement images not interpretable.  - Echo (5/22): EF 35-40% with basal inferoseptal, basal anteroseptal, and basal inferior akinesis.  - Echo (11/24): EF 40% with mild LV dilation, basal-mid septal akinesis, normal RV, IVC normal.   Laboratory Data:  High Sensitivity Troponin:   Recent Labs  Lab 07/14/23 2104  TROPONINIHS 38*      Chemistry Recent Labs  Lab 07/14/23 2104 07/14/23 2108  NA 135 133*  K 4.4 4.2  CL 100 102  CO2 15*  --   GLUCOSE 112* 106*  BUN 8 8  CREATININE 1.27* 1.10*  CALCIUM 8.7*  --   MG 1.8  --   GFRNONAA 48*  --   ANIONGAP 20*  --     Recent  Labs  Lab 07/14/23 2104  PROT 6.8  ALBUMIN 3.1*  AST 46*  ALT 26  ALKPHOS 83  BILITOT 1.1   Lipids No results for input(s): "CHOL", "TRIG", "HDL", "LABVLDL", "LDLCALC", "CHOLHDL" in the last 168 hours. Hematology Recent  Labs  Lab 07/14/23 2104 07/14/23 2108  WBC 13.0*  --   RBC 4.54  --   HGB 14.3 14.6  HCT 44.3 43.0  MCV 97.6  --   MCH 31.5  --   MCHC 32.3  --   RDW 13.6  --   PLT 335  --    Thyroid No results for input(s): "TSH", "FREET4" in the last 168 hours. BNP Recent Labs  Lab 07/14/23 2104  BNP 939.8*    DDimer No results for input(s): "DDIMER" in the last 168 hours.   Radiology/Studies:  DG Chest Portable 1 View Result Date: 07/14/2023 CLINICAL DATA:  SVT EXAM: PORTABLE CHEST 1 VIEW COMPARISON:  08/06/2021 FINDINGS: Left-sided pacing device as before. Pacer patches obscure portions of the chest. Similar elevation right diaphragm. Subsegmental atelectasis right lower lung. No consolidation, pleural effusion or pneumothorax. Difficult to exclude patchy infiltrate in the left upper lobe. IMPRESSION: 1. Difficult to exclude patchy infiltrate in the left upper lobe. 2. Similar elevation right diaphragm with subsegmental atelectasis right lower lung. Electronically Signed   By: Jasmine Pang M.D.   On: 07/14/2023 23:08     Assessment and Plan:   Sustained monomorphic ventricular tachycardia Chronic systolic heart failure Presented with acute onset wide-complex tachycardia with ventricular rates in the 190s in the setting of upper respiratory tract infection symptoms over the last 3 days and poor p.o. intake.  Jude CRT-D device personally interrogated at bedside.  Presenting rhythm at time of my assessment a sense-BiV paced.  VT 1 zone at 137 bpm with just ATP, VT-2 zone starts at 200 bpm including defibrillation.  Review of event that started around 7:10 PM on 12/17 and lasted 107 minutes shows evidence of VA conduction and periods of A-V dissociation (V>A) consistent with monomorphic VT.  She ultimately underwent synchronized cardioversion which was successful.  Does not appear overtly volume overloaded and suspected BNP elevation is secondary to sustained VT.  Previously on flecainide for PVC  suppression and did not tolerate amiodarone secondary to hyperthyroidism. - obtain TSH - echo - start lidocaine drip overnight  - replete mag - avoid Qtc prolonging agents - Hold on diuretics - appears euvolemic - K>4 and Mag >2 - continue BB - Continue GDMT including ARNI, BB, SGLT2i and MRA (spiro for home epleronone) - formal device interrogation in AM - EP regarding AAD choices  Upper respiratory tract infection Presenting with cough, congestion, mild purulent sputum production, fever and leukocytosis most consistent with URI though concern for focal pneumonia given CXR findings of possible infiltrate.  As such, will treat with antibiotics overnight. - CTX and doxycycline (avoid azithromycin due to Qtc)  Hyperlipidemia - continue statin   Risk Assessment/Risk Scores:       New York Heart Association (NYHA) Functional Class NYHA Class II    Code Status: Full Code - sister is surrogate decision maker  Severity of Illness: The appropriate patient status for this patient is INPATIENT. Inpatient status is judged to be reasonable and necessary in order to provide the required intensity of service to ensure the patient's safety. The patient's presenting symptoms, physical exam findings, and initial radiographic and laboratory data in the context of their chronic comorbidities is felt to place them at  high risk for further clinical deterioration. Furthermore, it is not anticipated that the patient will be medically stable for discharge from the hospital within 2 midnights of admission.   * I certify that at the point of admission it is my clinical judgment that the patient will require inpatient hospital care spanning beyond 2 midnights from the point of admission due to high intensity of service, high risk for further deterioration and high frequency of surveillance required.*   For questions or updates, please contact [facility] Please consult [email or web address] for contact  info under     Signed, Aundra Dubin, MD  07/15/2023 12:17 AM

## 2023-07-15 NOTE — Consult Note (Signed)
ELECTROPHYSIOLOGY CONSULT NOTE    Patient ID: Alison Howard MRN: [id number], DOB/AGE: 1960/12/06 62 y.o.  Admit date: 07/14/2023 Date of Consult: 07/15/2023  Primary Physician: Noberto Retort, MD Primary Cardiologist: None  Electrophysiologist: Dr. Graciela Husbands   Referring Provider: Dr. Geraldo Pitter  Patient Profile: Alison Howard is a 62 y.o. female with a history of systolic CHF (LVEF 40-45%), PVCs, VT s/p ablation 2023, OSA, obesity, and St Jude CRT-D who is being seen today for the evaluation of VT at the request of Dr. Geraldo Pitter.  HPI:  Chella Howard is a 62 y.o. female was last seen in EP clinic 02/2023 and was doing well at that time.   Previously had VT ablation attempt by Dr. Ladona Ridgel. Ablation catheter had a 93% pace map match, but did not terminate the VT, raising concern for an epicardial location.   The patient reports she developed URI symptoms on Thursday 12/12 and worsened over the weekend. URI type symptoms with cough, congesion, runny nose, and sore throat.  She had chills, fever to 100 at home. Also with poor appetite, mild nausea, and at least 2 episodes of post tussive emesis. She has been taking Dayquil for her cold.   Pt presented to [facility] 07/14/2023 with tachycardia. She has not missed medication.   HR jumped up suddenly to the 190's on 12/17, she felt immediately unwell and called EMS. Adenosine was reportedly unsuccessful, and he was urgently cardioverted in the ED.  Labs as below > leukocytosis with neutrophils, mild AKI.  CXR with LUL infiltrate.  Flu A/B and COVID negative.  Extended RVP pending.   She denies chest pain, palpitations, dyspnea, PND, orthopnea, nausea, vomiting, dizziness, syncope, edema, weight gain, or early satiety.   Labs Potassium4.2 (12/17 2108) Magnesium  1.8 (12/18 0035) Creatinine, ser  1.16* (12/18 0035) PLT  319 (12/18 0035) HGB  13.0 (12/18 0035) WBC 14.2* (12/18 0035) Troponin I (High Sensitivity)57* (12/18 0035).    Allergies,  Medical, Surgical, Social, and Family Histories have been reviewed and are referenced here-in when relevant for medical decision making.    Physical Exam: Vitals:   07/15/23 0523 07/15/23 0830 07/15/23 0908 07/15/23 0930  BP: 118/74 122/70 126/80 118/77  Pulse: 86 94 89 84  Resp: 16  18   Temp: 98.4 F (36.9 C)  98.4 F (36.9 C)   TempSrc:   Oral   SpO2: 92% 93% 96% 97%  Weight:      Height:        GEN- NAD, A&O x 3, normal affect   HEENT: Normocephalic, atraumatic Lungs- mild tachypnea but no distress, no crackles posteriorly, diminished   Heart- Regular rate and rhythm, No M/G/R.  GI- Soft, NT, ND.  Extremities- No clubbing, cyanosis, or edema   Radiology/Studies:   DG Chest Portable 1 View 07/14/2023 1. Difficult to exclude patchy infiltrate in the left upper lobe.  2. Similar elevation right diaphragm with subsegmental atelectasis right lower lung.   CUP PACEART REMOTE DEVICE CHECK 07/10/2023 Schedule remote reviewed. Normal device function.  F/U as scheduled LA, CVRS  ECHOCARDIOGRAM COMPLETE 06/17/2023 LVEF 40-45%, Grade 1 DD, low normal RV, Trivial MR.   Comparison(s): 12/25/2020: LVEF 35-40%, basal inferior, inferoseptal and anteroseptal hypokinesis.   DATE PVC %    02/2016 11 Multiple morphologies  12/2016 22  Multiple morphologies  04/2017 5.2 From device   01/2018 >10%    12/2020 2.6% From device  06/2022 3.8%             EKG:  on arrival shows WCT at 190 bpm; Likely VT by device interrogation (personally reviewed)  TELEMETRY: SR 70's with PVC's (personally reviewed)  DEVICE HISTORY:  A[employer]ott BiV ICD implanted 01/2018 for CMP   Assessment/Plan:  Ventricular Tachycardia, with h/o of the same PVCs H/o ablation 09/2021 by Dr. Ladona Ridgel with 93% pace map but failed to terminate the VT. Query if respiratory virus / acute illness driving VT -continue lidocaine infusion > consider transition to mexiletine  -continue bisoprolol -Will work with industry as to  why it fell into SVT as oppose to detection.  ATP initially successful, and then recurred but slower.  -Previously failed flecainide for her PVCs -> switched to amiodarone -> Did not tolerate due to Hypothyroidism -no driving for 6 months given DCCV for VT 07/14/23 -follow electrolytes closely, goal K+>4, Mg+ >2  Chronic Systolic CHF EF 40-45% 07/10/23.  No significant CAD on cath 09/2018 cMRI 12/2018 with poor images due to PPM. ? If need to consider updating with changes to imaging.  -euvolemic on exam  -follows with Advanced HF Team  -continue entresto, aldactone  Hypotension -Chronic component. Follow closely.  URI  -assess extended respiratory viral panel -follow up CXR  -ceftriaxone 2gm IV Q24h + doxycycline 100mg  BID     For questions or updates, please contact [facility] Please consult [email or web address] for contact info under Cardiology/STEMI.  Signed, Canary Brim, MSN, APRN, NP-C, AGACNP-BC [facility] - Electrophysiology  07/15/2023, 1:46 PM

## 2023-07-15 NOTE — ED Notes (Addendum)
Report given to Rayne Du, RN.

## 2023-07-15 NOTE — ED Notes (Signed)
Pt family member asked how to lower side rail. RN showed family member.

## 2023-07-15 NOTE — ED Notes (Signed)
RN emptied pt bsc

## 2023-07-15 NOTE — ED Provider Notes (Signed)
12:03 AM Case discussed again with our cardiology colleagues, patient will be admitted, lidocaine infusion started.   Gerhard Munch, MD 07/15/23 [phone]

## 2023-07-15 NOTE — Progress Notes (Addendum)
Interrogated patient at bedside with Dr. Nelly Laurence, & assistance of Industry.   Programming changes made as follows:   Chamber onset turned off Sudden onset turned on  Sudden onset Delta Mode > Fixed  Sudden onset Fixed Delta > 65ms VF Detection rate > changed from to VT detection rate/interval > changed from to   VT-2 energy 2 changed from 30j to off  VT2 Energy 3 changed from 40j to 30j VT2 Therapy 2 add stimuli per burst > off  VT2 Therapy 2 BCL mode > turned to adaptive VT2 Therapy Burst cycle length > 81% VT2 Therapy 2 Max Step > 50 ms  VT2 Therapy 2 minium burst cycle length > 200 ms  VT 2 Therapy 2 turned to 3  bursts, 8 stimuli, ramp on, ramp step 10ms, readaptive on, scan step 10ms, scanning on, CVRT > to ATP   See below for details.          Canary Brim, MSN, APRN, NP-C, AGACNP-BC [facility] - Electrophysiology  07/15/2023, 6:26 PM

## 2023-07-15 NOTE — Progress Notes (Signed)
Heart Failure Navigator Progress Note  Assessed for Heart & Vascular TOC clinic readiness.  Patient does not meet criteria due to Advanced Heart Failure Team patient of Dr. McLean.   Navigator will sign off at this time.   Mahreen Schewe, BSN, RN Heart Failure Nurse Navigator Secure Chat Only   

## 2023-07-16 ENCOUNTER — Other Ambulatory Visit (HOSPITAL_COMMUNITY): Payer: Self-pay

## 2023-07-16 ENCOUNTER — Inpatient Hospital Stay (HOSPITAL_COMMUNITY): Payer: BC Managed Care – PPO

## 2023-07-16 ENCOUNTER — Telehealth (HOSPITAL_COMMUNITY): Payer: Self-pay

## 2023-07-16 DIAGNOSIS — I472 Ventricular tachycardia, unspecified: Secondary | ICD-10-CM | POA: Diagnosis not present

## 2023-07-16 LAB — BASIC METABOLIC PANEL
Anion gap: 13 (ref 5–15)
BUN: 9 mg/dL (ref 8–23)
CO2: 24 mmol/L (ref 22–32)
Calcium: 8.6 mg/dL — ABNORMAL LOW (ref 8.9–10.3)
Chloride: 95 mmol/L — ABNORMAL LOW (ref 98–111)
Creatinine, Ser: 1.02 mg/dL — ABNORMAL HIGH (ref 0.44–1.00)
GFR, Estimated: >60 mL/min (ref >60)
Glucose, Bld: 98 mg/dL (ref 70–99)
Potassium: 4 mmol/L (ref 3.5–5.1)
Sodium: 132 mmol/L — ABNORMAL LOW (ref 135–145)

## 2023-07-16 MED ORDER — MEXILETINE HCL 200 MG PO CAPS
200.0000 mg | ORAL_CAPSULE | Freq: Three times a day (TID) | ORAL | Status: DC
  Administered 2023-07-16 – 2023-07-18 (×7): 200 mg via ORAL
  Filled 2023-07-16 (×9): qty 1

## 2023-07-16 MED ORDER — CHLORHEXIDINE GLUCONATE CLOTH 2 % EX PADS
6.0000 | MEDICATED_PAD | Freq: Every day | CUTANEOUS | Status: DC
  Administered 2023-07-16 – 2023-07-18 (×3): 6 via TOPICAL

## 2023-07-16 MED ORDER — FUROSEMIDE 20 MG PO TABS
20.0000 mg | ORAL_TABLET | ORAL | Status: DC
  Administered 2023-07-16 – 2023-07-18 (×2): 20 mg via ORAL
  Filled 2023-07-16 (×2): qty 1

## 2023-07-16 MED ORDER — SERTRALINE HCL 50 MG PO TABS
50.0000 mg | ORAL_TABLET | Freq: Every day | ORAL | Status: DC
  Administered 2023-07-17 – 2023-07-18 (×2): 50 mg via ORAL
  Filled 2023-07-16 (×2): qty 1

## 2023-07-16 MED ORDER — FUROSEMIDE 10 MG/ML IJ SOLN: 20.0000 mg | INTRAMUSCULAR | Status: DC

## 2023-07-16 NOTE — Care Management (Signed)
Transition of Care [facility]) - Inpatient Brief Assessment   Patient Details  Name: Alison Howard MRN: [id number] Date of Birth: Jun 14, 1961  Transition of Care [facility]) CM/SW Contact:    Lockie Pares, RN Phone Number: 07/16/2023, 1:15 PM   Clinical Narrative: 62 yo admitted with [facility] V tach, adjusted pacemaker, Pneumonia. On Lidocaine drip. Coronovirus ( not COVID 19) IV abx and supportive.  No needs identified at this time. The patient will be discussed in daily progressive rounds If a need is identified, please place a TOC consult   Transition of Care Asessment: Insurance and Status: Insurance coverage has been reviewed Patient has primary care physician: Yes Home environment has been reviewed: Lives by self Prior level of function:: Independent Prior/Current Home Services: No current home services Social Drivers of Health Review: SDOH reviewed no interventions necessary Readmission risk has been reviewed: Yes Transition of care needs: no transition of care needs at this time

## 2023-07-16 NOTE — Progress Notes (Signed)
  Patient Name: Alison Howard Date of Encounter: 07/16/2023  Primary Cardiologist: None Electrophysiologist: Sherryl Manges, MD  Interval Summary   The patient reports she feels some better today.  Less SOB.    At this time, the patient denies chest pain, shortness of breath, or any new concerns.  Vital Signs    Vitals:   07/16/23 0400 07/16/23 0500 07/16/23 0600 07/16/23 0700  BP: 112/72 105/73 (!) 121/103 (!) 128/92  Pulse: 68 70 75 76  Resp: (!) 29 (!) 24 (!) 24 (!) 29  Temp: 98.4 F (36.9 C)     TempSrc: Oral     SpO2: 90% 92% 97% 96%  Weight:  104.4 kg    Height:        Intake/Output Summary (Last 24 hours) at 07/16/2023 0750 Last data filed at 07/16/2023 0700 Gross per 24 hour  Intake 1238.17 ml  Output 1500 ml  Net -261.83 ml   Filed Weights   07/14/23 2046 07/16/23 0500  Weight: 106 kg 104.4 kg    Physical Exam    GEN- The patient is well appearing, alert and oriented x 3 today.   Lungs- Clear to ausculation bilaterally, normal work of breathing Cardiac- Regular rate and rhythm, frequent PVC's , no murmurs, rubs or gallops GI- soft, NT, ND, + BS Extremities- no clubbing or cyanosis. No edema  Telemetry    SR 60-80's, frequent PVC's, 1 NSVT but no VT  (personally reviewed)  Hospital Course    Alison Howard is a 62 y.o. female with a history of systolic CHF (LVEF 40-45%), PVCs, VT s/p ablation 2023, OSA, obesity, and St Jude CRT-D  admitted 07/15/23 with VT.  Device recognized one episode of VT and appropriately treated with ATP x2 that broke.  Repeat episode was binned as SVT but was actually VT and did not get therapy.  DCCV in ER.  ICD reprogrammed, see note from 12/18 PM.    Assessment & Plan    Ventricular Tachycardia  Multiple episodes, s/p appropriate ATP x2, device did not recognize / binned as SVT.  Device reprogrammed.    -tele monitoring  -transition off lido gtt to mexiletine  -if remains stable off IV lido, may be able to transfer out of  ICU -unable to take amiodarone due to hypothyroidism   CHFmrEF -LVEF 40-45% -consider re-evaluation of CMR  -resume home PO lasix, 20mg  every other day   URI with Coronavirus (not COVID) -supportive care  -pulmonary hygiene -IS, mobilze  -droplet precautions -ceftriaxone + doxycycline, continue with LUL infiltrate as may be post viral bacterial infection  -follow up CXR      For questions or updates, please contact [facility] Please consult [email or web address] for contact info under Cardiology/STEMI.  Signed, Canary Brim, MSN, APRN, NP-C, AGACNP-BC [facility] - Electrophysiology  07/16/2023, 7:50 AM

## 2023-07-16 NOTE — Telephone Encounter (Signed)
Pharmacy Patient Advocate Encounter  Insurance verification completed.    The patient is insured through [employer].     Ran test claim for Mexiletine and the current 30 day co-pay is $10.80.   This test claim was processed through [facility] ([facility])- copay amounts may vary at other pharmacies due to pharmacy/plan contracts, or as the patient moves through the different stages of their insurance plan.

## 2023-07-17 ENCOUNTER — Other Ambulatory Visit: Payer: Self-pay

## 2023-07-17 DIAGNOSIS — I472 Ventricular tachycardia, unspecified: Secondary | ICD-10-CM | POA: Diagnosis not present

## 2023-07-17 LAB — CBC
HCT: 38.3 % (ref 36.0–46.0)
Hemoglobin: 12.9 g/dL (ref 12.0–15.0)
MCH: 31.4 pg (ref 26.0–34.0)
MCHC: 33.7 g/dL (ref 30.0–36.0)
MCV: 93.2 fL (ref 80.0–100.0)
Platelets: 394 10*3/uL (ref 150–400)
RBC: 4.11 MIL/uL (ref 3.87–5.11)
RDW: 13.7 % (ref 11.5–15.5)
WBC: 8.3 10*3/uL (ref 4.0–10.5)
nRBC: 0 % (ref 0.0–0.2)

## 2023-07-17 LAB — BASIC METABOLIC PANEL
Anion gap: 13 (ref 5–15)
BUN: 9 mg/dL (ref 8–23)
CO2: 24 mmol/L (ref 22–32)
Calcium: 8.6 mg/dL — ABNORMAL LOW (ref 8.9–10.3)
Chloride: 99 mmol/L (ref 98–111)
Creatinine, Ser: 0.88 mg/dL (ref 0.44–1.00)
GFR, Estimated: >60 mL/min (ref >60)
Glucose, Bld: 92 mg/dL (ref 70–99)
Potassium: 3.6 mmol/L (ref 3.5–5.1)
Sodium: 136 mmol/L (ref 135–145)

## 2023-07-17 MED ORDER — PNEUMOCOCCAL 20-VAL CONJ VACC 0.5 ML IM SUSY
0.5000 mL | PREFILLED_SYRINGE | INTRAMUSCULAR | Status: DC
  Filled 2023-07-17: qty 0.5

## 2023-07-17 MED ORDER — FUROSEMIDE 10 MG/ML IJ SOLN
20.0000 mg | Freq: Once | INTRAMUSCULAR | Status: AC
  Administered 2023-07-17: 20 mg via INTRAVENOUS
  Filled 2023-07-17: qty 2

## 2023-07-17 NOTE — Progress Notes (Signed)
  Patient Name: Alison Howard Date of Encounter: 07/17/2023  Primary Cardiologist: None Electrophysiologist: Sherryl Manges, MD  Interval Summary   The patient reports she feels better but not back to baseline.  Off O2 and sats 88-91%.    No VT overnight  Vital Signs    Vitals:   07/17/23 0700 07/17/23 0800 07/17/23 0808 07/17/23 1000  BP: 127/88 (!) 122/94  (!) 119/94  Pulse: 75 76  78  Resp: (!) 27 (!) 27  16  Temp:   98.3 F (36.8 C)   TempSrc:   Oral   SpO2: 91% 90%  91%  Weight:      Height:        Intake/Output Summary (Last 24 hours) at 07/17/2023 1025 Last data filed at 07/17/2023 0600 Gross per 24 hour  Intake 975.33 ml  Output 1952 ml  Net -976.67 ml   Filed Weights   07/14/23 2046 07/16/23 0500 07/17/23 0500  Weight: 106 kg 104.4 kg 103.3 kg    Physical Exam    GEN- The patient is well appearing, alert and oriented x 3 today.   Lungs- Clear to ausculation bilaterally, normal work of breathing Cardiac- Regular rate and rhythm, no murmurs, rubs or gallops GI- soft, NT, ND, + BS Extremities- no clubbing or cyanosis. No edema  Telemetry    SR 70's, occ PVC's, no VT (personally reviewed)  Hospital Course    Arshika Tams is a 62 y.o. female history of systolic CHF (LVEF 40-45%), PVCs, VT s/p ablation 2023, OSA, obesity, and St Jude CRT-D  admitted 07/15/23 with VT.  Device recognized one episode of VT and appropriately treated with ATP x2 that broke.  Repeat episode was binned as SVT but was actually VT and did not get therapy.  DCCV in ER.  ICD reprogrammed, see note from 12/18 PM.     Assessment & Plan    Ventricular Tachycardia  Multiple episodes, s/p appropriate ATP x2, device did not recognize / binned as SVT.  Device reprogrammed.   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% -consider re-evaluation of CMR -continue home lasix dosing of every other  day 20mg   -additional 20mg  IV x1 now in the event CXR findings have volume component   URI with Coronavirus (not COVID)  LUL Infiltrate on Admit Viral PNA vs post viral bacterial  -CXR 12/19 with low lung volumes but worsened multifocal infiltrates on left, concern for possible retrocardiac infiltrate -O2 sats 88-91%, pt not normally on oxygen, no baseline pulmonary pathology  -continue ceftriaxone, doxycycline -appreciate Pulmonary evaluation for any additional rec's  -lasix as above  -assess ambulatory O2 needs  -incentive spirometry   Dispo:  Anticipate she will need to stay one more day for observation but await Pulmonary input      For questions or updates, please contact [facility] Please consult [email or web address] for contact info under Cardiology/STEMI.  Signed, Canary Brim, MSN, APRN, NP-C, AGACNP-BC [facility] - Electrophysiology  07/17/2023, 11:18 AM

## 2023-07-17 NOTE — Progress Notes (Signed)
NAME:  Alison Howard, MRN:  [id number], DOB:  05-Dec-1960, LOS: 2 ADMISSION DATE:  07/14/2023, CONSULTATION DATE: 07/17/2023 REFERRING MD:  Tiana Loft MD, CHIEF COMPLAINT:   Acute hypoxic respiratory failure  History of Present Illness:   62 year old with history of CHF, recurrent VT's, OSA, obesity presenting with recurrent multiple ventricular tachycardia.  She was admitted to the EP cardiology service.  Initially on her lidocaine drip which was transitioned to mexiletine.  Device settings adjusted with improvement.  Hospital course complicated by acute hypoxic respiratory failure, coronavirus infection and community-acquired pneumonia.  She has been started on doxycycline, ceftriaxone.  Needed supplemental oxygen for low O2 sats.  Chest x-ray yesterday shows increasing bilateral airspace disease.  PCCM consulted for help with management.  No significant smoking history or exposures.  Was in a clerical job and no family history of lung disease.  Pertinent  Medical History    has a past medical history of CHF (congestive heart failure) (HCC), Chronic systolic heart failure (HCC) (5/42/7062), GERD (gastroesophageal reflux disease), Hypercholesteremia, LBBB (left bundle branch block), Morbid obesity (HCC) (12/28/2014), NICM (nonischemic cardiomyopathy) (HCC), OSA (obstructive sleep apnea) (12/28/2014), and Ventricular tachycardia (HCC).   Significant Hospital Events: Including procedures, antibiotic start and stop dates in addition to other pertinent events     Interim History / Subjective:   Feels well.  Has been on room air throughout the day with sats in the low 90s  Objective   Blood pressure 126/87, pulse 77, temperature 98.5 F (36.9 C), temperature source Oral, resp. rate (!) 26, height 5\' 2"  (1.575 m), weight 103.3 kg, SpO2 91%.        Intake/Output Summary (Last 24 hours) at 07/17/2023 1314 Last data filed at 07/17/2023 1200 Gross per 24 hour  Intake 1080.33 ml  Output 2852 ml  Net  -1771.67 ml   Filed Weights   07/14/23 2046 07/16/23 0500 07/17/23 0500  Weight: 106 kg 104.4 kg 103.3 kg    Examination: Blood pressure 126/87, pulse 77, temperature 98.5 F (36.9 C), temperature source Oral, resp. rate (!) 26, height 5\' 2"  (1.575 m), weight 103.3 kg, SpO2 91%. Gen:      No acute distress HEENT:  EOMI, sclera anicteric Neck:     No masses; no thyromegaly Lungs:    Clear to auscultation bilaterally; normal respiratory effort CV:         Regular rate and rhythm; no murmurs Abd:      + bowel sounds; soft, non-tender; no palpable masses, no distension Ext:    No edema; adequate peripheral perfusion Skin:      Warm and dry; no rash Neuro: alert and oriented x 3 Psych: normal mood and affect   Chest x-ray 07/14/2023-right diaphragm elevation with atelectasis, infiltrate in the left upper lobe Chest x-ray 07/16/2023-increasing patchy bilateral airspace disease. I have reviewed the images personally.  Labs significant for mild leukocytosis to 14.2 that has improved to 8.3  Resolved Hospital Problem list     Assessment & Plan:  Acute hypoxic respiratory failure Secondary to viral pneumonia, likely has superimposed community-acquired pneumonia Chest x-ray shows worsening infiltrates but clinically she appears stable.  Still has borderline O2 sats on room air Continue antibiotics for community-acquired pneumonia coverage.  Recommend a 7-day course Anticipate discharge tomorrow.  Would observe her overnight to make sure she is stable Check ambulatory O2 sats before discharge Will arrange follow-up with the pulmonary clinic for repeat chest x-ray to make sure that the infiltrate has cleared  Best Practice (  right click and "Reselect all SmartList Selections" daily)   Per primary team  Signature:   Chilton Greathouse MD [facility] See Amion for pager  If no response to pager , please call [phone] until 7pm After 7:00 pm call Elink   [phone] 07/17/2023, 2:16 PM

## 2023-07-17 NOTE — Discharge Summary (Incomplete)
ELECTROPHYSIOLOGY DISCHARGE SUMMARY    Patient ID: Alison Howard,  MRN: [id number], DOB/AGE: 1960/08/15 62 y.o.  Admit date: 07/14/2023 Discharge date: 07/18/2023  Primary Care Physician: Noberto Retort, MD  Primary Cardiologist: None  Electrophysiologist: Dr. Graciela Husbands   Primary Discharge Diagnosis:  Ventricular Tachycardia, Sustained  Secondary Discharge Diagnosis:  URI with Coronavirus (not COVID) Multifocal Pulmonary Infiltrates: Viral vs Bacterial PNA  Mild AKI  Procedures This Admission:  N/A    Brief HPI: Alison Howard is a 62 y.o. 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  Previously had VT ablation attempt by Dr. Ladona Ridgel. Ablation catheter had a 93% pace map match, but did not terminate the VT, raising concern for an epicardial location. The patient reported she developed URI symptoms on Thursday 12/12 and worsened over the weekend. URI type symptoms with cough, congesion, runny nose, and sore throat.  She had chills, fever to 100 at home. Also with poor appetite, mild nausea, and at least 2 episodes of post tussive emesis. She has been taking Dayquil for her cold. Pt [facility]/14/2023 with tachycardia. She has not missed medication. She noted her HR jumped up suddenly to the 190's on 12/17, she felt immediately unwell and called EMS. Adenosine was reportedly unsuccessful, and he was urgently cardioverted in the ED.  Labs as below > leukocytosis with neutrophils, mild AKI.  CXR with LUL infiltrate.  Flu A/B and COVID were negative. RVP did return positive for non-Covid coronavirus.  Hospital Course:  Her device was reprogrammed to more readily recognize VT.  She was started on IV lidocaine on admission. She had no further VT. Lidocaine infusion was transitioned to Mexiletine.  She remained stable from an arrhythmia standpoint.  Echo showed EF 40% with anteroseptal akinesis,  basal to mid inferoseptal akinesis, and basal inferior akinesis, mild LV dilation, normal RV. Per echo report, "In absence of CAD, would consider workup for cardiac sarcoidosis." EP team notes indicated suggestion of consideration of repeat cMRI. This was not felt to be needed inpatient. The patient had an extended RVP that was positive for coronavirus (not COVID).  CXR showed multi-focal infiltrates that worsened on subsequent film raising concern for viral vs bacterial PNA.  She was treated with abx. Her O2 saturations remained low and Pulmonary was consulted.  Incentive spirometry was ordered, she was planned for subsequent follow up CXR as an outpatient.  The patient was given additional diuresis while inpatient. Telemetry review before discharge was felt reassuring by Dr. Elberta Fortis. The patient ambulated today with nursing staff and maintained normal O2 saturation without needing home O2 at discharge. The patient was examined by Dr. Elberta Fortis and considered to be stable for discharge. The patient will be seen back by EP APP in 2 weeks (appts arranged) for post hospital care. I also sent a msg to the CHF team via staff msgs to arrange follow-up to review potential need for cMRI, sarcoidosis evaluation as per previous notes. Meds sent to [facility] pharmacy. Per discussion with Dr. Caren Griffins will continue doxycycline + transition to oral cefdinir to complete 7 day course.   Discussed additional med rec questions at discharge with Dr. Elberta Fortis - her Celexa also transitioned to Zoloft, recommended to continue this at discharge and f/u PCP to monitor how she is doing with this transition, also recommended to resume prior-to-admission epleronone dose at DC, resume PTA potassium and magnesium oxide, and continue Jardiance  once daily instead of every other day.  She was advised no driving or returning to work until cleared by EP.  Physical Exam: Vitals:   07/18/23 0500 07/18/23 0600 07/18/23 0700 07/18/23 0800  BP: 104/77  123/83 130/88 (!) 129/91  Pulse: 72 74 77 79  Resp: (!) 23 (!) 22 (!) 8 (!) 25  Temp:    98.3 F (36.8 C)  TempSrc:    Oral  SpO2: 90% 92% 97% 95%  Weight: 102.6 kg     Height:      Exam per MD   Discharge Medications:  Allergies as of 07/18/2023       Reactions   Ciprofloxacin Hcl Anaphylaxis   Codeine Anaphylaxis, Nausea And Vomiting   Morphine And Codeine Anaphylaxis   Sulfa Antibiotics Anaphylaxis   Zetia [ezetimibe] Rash   Rash and muscle aches   Atorvastatin    Myalgias    Dapagliflozin Hives   Rosuvastatin    Myalgias   Simvastatin    Myalgias    Welchol [colesevelam]    myalgias        Medication List     STOP taking these medications    citalopram 40 MG tablet Commonly known as: CELEXA       TAKE these medications    bisoprolol 5 MG tablet Commonly known as: ZEBETA TAKE 1 TABLET BY MOUTH TWICE A DAY   cefdinir 300 MG capsule Commonly known as: OMNICEF Take 1 capsule (300 mg total) by mouth every 12 (twelve) hours. Start taking on: July 19, 2023   doxycycline 100 MG tablet Commonly known as: VIBRA-TABS Take 1 tablet (100 mg total) by mouth every 12 (twelve) hours.   empagliflozin 10 MG Tabs tablet Commonly known as: Jardiance Take 1 tablet (10 mg total) by mouth daily before breakfast. What changed: when to take this   Entresto 24-26 MG Generic drug: sacubitril-valsartan TAKE 1 TABLET BY MOUTH TWICE A DAY   eplerenone 50 MG tablet Commonly known as: INSPRA Take 1 tablet (50 mg total) by mouth daily.   furosemide 20 MG tablet Commonly known as: LASIX Take 1 tablet (20 mg total) by mouth every other day.   guaiFENesin 600 MG 12 hr tablet Commonly known as: MUCINEX Take 1 tablet (600 mg total) by mouth 2 (two) times daily.   levothyroxine 75 MCG tablet Commonly known as: SYNTHROID 1 tablet in the morning on an empty stomach Orally Every other day alternating with levothyroxine 50 mcg   levothyroxine 50 MCG tablet Commonly  known as: SYNTHROID 1 tablet in the morning on an empty stomach Orally Once a day alternating with 75 mcg every other day for 90 days   LORazepam 0.5 MG tablet Commonly known as: ATIVAN Take 0.5 mg by mouth 2 (two) times daily as needed for anxiety.   magnesium oxide 400 (240 Mg) MG tablet Commonly known as: MAG-OX Take 1 tablet (400 mg total) by mouth 2 (two) times daily. What changed: See the new instructions.   mexiletine 200 MG capsule Commonly known as: MEXITIL Take 1 capsule (200 mg total) by mouth every 8 (eight) hours.   pantoprazole 40 MG tablet Commonly known as: PROTONIX Take 40 mg by mouth 2 (two) times daily.   potassium chloride SA 20 MEQ tablet Commonly known as: Klor-Con M20 Take 1 tablet (20 mEq total) by mouth every other day.   pravastatin 80 MG tablet Commonly known as: PRAVACHOL TAKE UP TO 1 TABLET DAILY AS TOLERATED   sertraline 50 MG  tablet Commonly known as: ZOLOFT Take 1 tablet (50 mg total) by mouth daily. Start taking on: July 19, 2023        Disposition: Home with usual follow up as in AVS  Duration of Discharge Encounter: Greater than 30 minutes including physician time.  Signed, Ronie Spies PA-C Took over discharge summary by Canary Brim, NP 07/18/2023   I have seen and examined this patient with Ronie Spies.  Agree with above, note added to reflect my findings.  Patient admitted to the hospital with ICD therapy for ventricular tachycardia.  She received ATP.  She was started on mexiletine.  She was found to have viral pneumonia as well as bacterial pneumonia treated by critical care.  Respiratory status improved.  No further ventricular arrhythmias.  Plan for discharge today with follow-up in clinic.  Patient understands no driving for 6 months.  Will M. Camnitz MD 07/18/2023 11:43 AM

## 2023-07-18 ENCOUNTER — Other Ambulatory Visit (HOSPITAL_COMMUNITY): Payer: Self-pay

## 2023-07-18 DIAGNOSIS — I472 Ventricular tachycardia, unspecified: Secondary | ICD-10-CM | POA: Diagnosis not present

## 2023-07-18 LAB — BASIC METABOLIC PANEL
Anion gap: 17 — ABNORMAL HIGH (ref 5–15)
BUN: 11 mg/dL (ref 8–23)
CO2: 20 mmol/L — ABNORMAL LOW (ref 22–32)
Calcium: 8.9 mg/dL (ref 8.9–10.3)
Chloride: 97 mmol/L — ABNORMAL LOW (ref 98–111)
Creatinine, Ser: 0.98 mg/dL (ref 0.44–1.00)
GFR, Estimated: >60 mL/min (ref >60)
Glucose, Bld: 91 mg/dL (ref 70–99)
Potassium: 3.5 mmol/L (ref 3.5–5.1)
Sodium: 134 mmol/L — ABNORMAL LOW (ref 135–145)

## 2023-07-18 MED ORDER — POTASSIUM CHLORIDE CRYS ER 20 MEQ PO TBCR
20.0000 meq | EXTENDED_RELEASE_TABLET | Freq: Once | ORAL | Status: AC
  Administered 2023-07-18: 20 meq via ORAL
  Filled 2023-07-18: qty 1

## 2023-07-18 MED ORDER — CEFDINIR 300 MG PO CAPS: 300.0000 mg | ORAL_CAPSULE | Freq: Two times a day (BID) | ORAL | Status: DC

## 2023-07-18 MED ORDER — MEXILETINE HCL 200 MG PO CAPS
200.0000 mg | ORAL_CAPSULE | Freq: Three times a day (TID) | ORAL | 3 refills | Status: DC
  Filled 2023-07-18: qty 90, 30d supply, fill #0

## 2023-07-18 MED ORDER — DOXYCYCLINE HYCLATE 100 MG PO TABS
100.0000 mg | ORAL_TABLET | Freq: Two times a day (BID) | ORAL | 0 refills | Status: DC
  Filled 2023-07-18: qty 7, 4d supply, fill #0

## 2023-07-18 MED ORDER — MAGNESIUM OXIDE -MG SUPPLEMENT 400 (240 MG) MG PO TABS: 400.0000 mg | ORAL_TABLET | Freq: Two times a day (BID) | ORAL | Status: AC

## 2023-07-18 MED ORDER — SERTRALINE HCL 50 MG PO TABS
50.0000 mg | ORAL_TABLET | Freq: Every day | ORAL | 1 refills | Status: DC
  Filled 2023-07-18: qty 30, 30d supply, fill #0

## 2023-07-18 MED ORDER — GUAIFENESIN ER 600 MG PO TB12
600.0000 mg | ORAL_TABLET | Freq: Two times a day (BID) | ORAL | 0 refills | Status: DC
  Filled 2023-07-18: qty 7, 4d supply, fill #0

## 2023-07-18 MED ORDER — ORAL CARE MOUTH RINSE: 15.0000 mL | OROMUCOSAL | Status: DC | PRN

## 2023-07-18 MED ORDER — CEFDINIR 300 MG PO CAPS
300.0000 mg | ORAL_CAPSULE | Freq: Two times a day (BID) | ORAL | 0 refills | Status: DC
  Filled 2023-07-18: qty 6, 3d supply, fill #0

## 2023-07-18 NOTE — Progress Notes (Signed)
Rounding Note    Patient Name: Quenetta Wiman Date of Encounter: 07/18/2023  [facility] Cardiologist: None   Subjective   Patient feeling well.  Continued cough, but improved from yesterday  Inpatient Medications    Scheduled Meds:  bisoprolol  5 mg Oral BID   Chlorhexidine Gluconate Cloth  6 each Topical Daily   doxycycline  100 mg Oral Q12H   empagliflozin  10 mg Oral QAC breakfast   enoxaparin (LOVENOX) injection  40 mg Subcutaneous Q24H   furosemide  20 mg Oral QODAY   guaiFENesin  600 mg Oral BID   levothyroxine  50 mcg Oral QODAY   levothyroxine  75 mcg Oral QODAY   mexiletine  200 mg Oral Q8H   pantoprazole  40 mg Oral BID   pneumococcal 20-valent conjugate vaccine  0.5 mL Intramuscular Tomorrow-1000   pravastatin  80 mg Oral Daily   sacubitril-valsartan  1 tablet Oral BID   sertraline  50 mg Oral Daily   sodium chloride flush  3 mL Intravenous Q12H   spironolactone  25 mg Oral Daily   Continuous Infusions:  cefTRIAXone (ROCEPHIN)  IV 2 g (07/18/23 0033)   PRN Meds: acetaminophen, benzonatate, ondansetron (ZOFRAN) IV, mouth rinse, sodium chloride flush   Vital Signs    Vitals:   07/18/23 0500 07/18/23 0600 07/18/23 0700 07/18/23 0800  BP: 104/77 123/83 130/88 (!) 129/91  Pulse: 72 74 77 79  Resp: (!) 23 (!) 22 (!) 8 (!) 25  Temp:    98.3 F (36.8 C)  TempSrc:    Oral  SpO2: 90% 92% 97% 95%  Weight: 102.6 kg     Height:        Intake/Output Summary (Last 24 hours) at 07/18/2023 0945 Last data filed at 07/18/2023 0100 Gross per 24 hour  Intake 600 ml  Output 1700 ml  Net -1100 ml      07/18/2023    5:00 AM 07/17/2023    5:00 AM 07/16/2023    5:00 AM  Last 3 Weights  Weight (lbs) 226 lb 3.1 oz 227 lb 11.8 oz 230 lb 2.6 oz  Weight (kg) 102.6 kg 103.3 kg 104.4 kg      Telemetry    Sinus rhythm, ventricular paced- Personally Reviewed  ECG    None new- Personally Reviewed  Physical Exam   GEN: No acute distress.   Neck:  No JVD Cardiac: RRR, no murmurs, rubs, or gallops.  Respiratory: Clear to auscultation bilaterally. GI: Soft, nontender, non-distended  MS: No edema; No deformity. Neuro:  Nonfocal  Psych: Normal affect   Labs    High Sensitivity Troponin:   Recent Labs  Lab 07/14/23 2104 07/15/23 0035  TROPONINIHS 38* 57*     Chemistry Recent Labs  Lab 07/14/23 2104 07/14/23 2108 07/15/23 0035 07/16/23 0225 07/17/23 0215 07/18/23 0218  NA 135   < >  --  132* 136 134*  K 4.4   < >  --  4.0 3.6 3.5  CL 100   < >  --  95* 99 97*  CO2 15*  --   --  24 24 20*  GLUCOSE 112*   < >  --  98 92 91  BUN 8   < >  --  9 9 11   CREATININE 1.27*   < > 1.16* 1.02* 0.88 0.98  CALCIUM 8.7*  --   --  8.6* 8.6* 8.9  MG 1.8  --  1.8  --   --   --  PROT 6.8  --   --   --   --   --   ALBUMIN 3.1*  --   --   --   --   --   AST 46*  --   --   --   --   --   ALT 26  --   --   --   --   --   ALKPHOS 83  --   --   --   --   --   BILITOT 1.1  --   --   --   --   --   GFRNONAA 48*  --  53* >60 >60 >60  ANIONGAP 20*  --   --  13 13 17*   < > = values in this interval not displayed.    Lipids No results for input(s): "CHOL", "TRIG", "HDL", "LABVLDL", "LDLCALC", "CHOLHDL" in the last 168 hours.  Hematology Recent Labs  Lab 07/14/23 2104 07/14/23 2108 07/15/23 0035 07/17/23 0215  WBC 13.0*  --  14.2* 8.3  RBC 4.54  --  4.13 4.11  HGB 14.3 14.6 13.0 12.9  HCT 44.3 43.0 39.2 38.3  MCV 97.6  --  94.9 93.2  MCH 31.5  --  31.5 31.4  MCHC 32.3  --  33.2 33.7  RDW 13.6  --  13.6 13.7  PLT 335  --  319 394   Thyroid  Recent Labs  Lab 07/15/23 0035  TSH 4.307    BNP Recent Labs  Lab 07/14/23 2104  BNP 939.8*    DDimer No results for input(s): "DDIMER" in the last 168 hours.   Radiology    No results found.  Cardiac Studies     Patient Profile     62 y.o. female with chronic systolic heart failure presented to the hospital with ventricular tachycardia  Assessment & Plan    1.   Ventricular tachycardia: Post ATP x 2.  Currently on mexiletine.  No further VT.  EP outpatient follow-up has been arranged.  2.  Chronic systolic heart failure: No obvious volume overload.  On Lasix 20 mg every other day.  3.  URI: Pulmonology/critical care has been consulted.  Stable.  Okay for discharge.      For questions or updates, please contact [facility] Please consult [email or web address] for contact info under        Signed, Chi Garlow Jorja Loa, MD  07/18/2023, 9:45 AM

## 2023-07-18 NOTE — Progress Notes (Signed)
Discharge instructions given to patient. All questions answered. Pt satisfied that she has all belongings. 2x PIV removed. Vital signs stable. Pt discharged home with sister.

## 2023-07-30 NOTE — Progress Notes (Addendum)
  Electrophysiology Office Note:   Date:  07/31/2023  ID:  Alison Howard, DOB 1960/09/04, MRN [id number]  Primary Cardiologist: None Primary Heart Failure: Ezra Shuck, MD Electrophysiologist: Elspeth Sage, MD       History of Present Illness:   Alison Howard is a 63 y.o. 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    Admitted 12/17-12/21/24 with VT in the setting of coronavirus viral URI (not COVID), multifocal pulmonary infiltrates: viral vs bacterial PNA. Her device was reprogrammed to improve recognition of VT.  She was started on lidocaine  and transitioned to Mexiletine. Pulmonary was consulted for worsening infiltrates and hypoxia. She was treated with abx.   Since discharge from hospital the patient reports she is feeling better but is not back to baseline. Notes she had her CXR with her PCP. She continues to have a cough. No further episodes of VT or known fast rates.   She denies chest pain, palpitations, dyspnea, PND, orthopnea, nausea, vomiting, dizziness, syncope, edema, weight gain, or early satiety.   Review of systems complete and found to be negative unless listed in HPI.   EP Information / Studies Reviewed:    EKG is ordered today. Personal review as below.  EKG Interpretation Date/Time:  Friday July 31 2023 09:06:47 EST Ventricular Rate:  82 PR Interval:  126 QRS Duration:  126 QT Interval:  462 QTC Calculation: 539 R Axis:   -58  Text Interpretation: Atrial-sensed ventricular-paced rhythm Biventricular pacemaker detected Confirmed by Aniceto Jarvis ([id number]) on 07/31/2023 9:31:23 AM   ICD Interrogation-  reviewed in detail today,  See PACEART report.  Device History: Abbott BiV ICD implanted 12/24/10 for sCHF/cardiomyopathy History of appropriate therapy: Yes History of AAD therapy: Yes; currently on lidocaine  . Intolerant of amiodarone  due to  hypothyroidism.           Physical Exam:   VS:  BP 102/60   Pulse 82   Ht 5' 2 (1.575 m)   Wt 224 lb 6.4 oz (101.8 kg)   SpO2 (!) 89%   BMI 41.04 kg/m    Wt Readings from Last 3 Encounters:  07/31/23 224 lb 6.4 oz (101.8 kg)  07/18/23 226 lb 3.1 oz (102.6 kg)  06/17/23 234 lb 6.4 oz (106.3 kg)     GEN: Well nourished, well developed in no acute distress NECK: No JVD; No carotid bruits CARDIAC: Regular rate and rhythm, no murmurs, rubs, gallops RESPIRATORY:  Clear to auscultation without rales, wheezing or rhonchi  ABDOMEN: Soft, non-tender, non-distended EXTREMITIES:  No edema; No deformity   ASSESSMENT AND PLAN:    VT s/p Abbott CRT-D  -euvolemic today -no further episodes of VT on device, VT zones reviewed with industry -93% BiV pacing  -Stable on an appropriate medical regimen -Normal ICD function -See Pace Art report -No changes today -continue mexiletine   CHFmrEF  LVEF 40-45% -plan for repeat cMRI per advanced heart failure team  -euvolemic on exam   Recent URI / LUL Infiltrate  -completed abx -ongoing cough but improving  -follow up with Pulmonary > referral placed for repeat CXR in 2-3 mo to ensure clearance of LUL infiltrate  Disposition:   Follow up with Dr. Sage in 6 months   Signed, Jarvis Aniceto, MSN, APRN, NP-C, AGACNP-BC [facility] - Electrophysiology  07/31/2023, 1:50 PM

## 2023-07-31 ENCOUNTER — Encounter: Payer: Self-pay | Admitting: Pulmonary Disease

## 2023-07-31 ENCOUNTER — Ambulatory Visit: Payer: Commercial Managed Care - HMO | Attending: Pulmonary Disease | Admitting: Pulmonary Disease

## 2023-07-31 VITALS — BP 102/60 | HR 82 | Ht 62.0 in | Wt 224.4 lb

## 2023-07-31 DIAGNOSIS — I472 Ventricular tachycardia, unspecified: Secondary | ICD-10-CM | POA: Diagnosis not present

## 2023-07-31 DIAGNOSIS — G4733 Obstructive sleep apnea (adult) (pediatric): Secondary | ICD-10-CM

## 2023-07-31 DIAGNOSIS — I5022 Chronic systolic (congestive) heart failure: Secondary | ICD-10-CM | POA: Diagnosis not present

## 2023-07-31 LAB — CUP PACEART INCLINIC DEVICE CHECK
Battery Remaining Longevity: 31 mo
Brady Statistic RA Percent Paced: 0 %
Brady Statistic RV Percent Paced: 99.14 %
Date Time Interrogation Session: 20250103134824
HighPow Impedance: 67.1603
Implantable Lead Connection Status: 753985
Implantable Lead Connection Status: 753985
Implantable Lead Connection Status: 753985
Implantable Lead Implant Date: 20120529
Implantable Lead Implant Date: 20120529
Implantable Lead Implant Date: 20120529
Implantable Lead Location: 753858
Implantable Lead Location: 753859
Implantable Lead Location: 753860
Implantable Pulse Generator Implant Date: 20190724
Lead Channel Pacing Threshold Amplitude: 1.125 V
Lead Channel Pacing Threshold Amplitude: 1.75 V
Lead Channel Pacing Threshold Pulse Width: 0.5 ms
Lead Channel Pacing Threshold Pulse Width: 0.5 ms
Lead Channel Sensing Intrinsic Amplitude: 12 mV
Lead Channel Sensing Intrinsic Amplitude: 2.9 mV
Lead Channel Setting Pacing Amplitude: 2 V
Lead Channel Setting Pacing Amplitude: 2.25 V
Lead Channel Setting Pacing Amplitude: 2.25 V
Lead Channel Setting Pacing Pulse Width: 0.5 ms
Lead Channel Setting Pacing Pulse Width: 0.5 ms
Lead Channel Setting Sensing Sensitivity: 0.5 mV
Pulse Gen Serial Number: 9842506

## 2023-07-31 NOTE — Patient Instructions (Signed)
 Medication Instructions:    Your physician recommends that you continue on your current medications as directed. Please refer to the Current Medication list given to you today.'    *If you need a refill on your cardiac medications before your next appointment, please call your pharmacy*   Lab Work: NONE ORDERED  TODAY   If you have labs (blood work) drawn today and your tests are completely normal, you will receive your results only by: MyChart Message (if you have MyChart) OR A paper copy in the mail If you have any lab test that is abnormal or we need to change your treatment, we will call you to review the results.   Testing/Procedures: NONE ORDERED  TODAY     Follow-Up: At [facility], you and your health needs are our priority.  As part of our continuing mission to provide you with exceptional heart care, we have created designated Provider Care Teams.  These Care Teams include your primary Cardiologist (physician) and Advanced Practice Providers (APPs -  Physician Assistants and Nurse Practitioners) who all work together to provide you with the care you need, when you need it.  We recommend signing up for the patient portal called MyChart.  Sign up information is provided on this After Visit Summary.  MyChart is used to connect with patients for Virtual Visits (Telemedicine).  Patients are able to view lab/test results, encounter notes, upcoming appointments, etc.  Non-urgent messages can be sent to your provider as well.   To learn more about what you can do with MyChart, go to [email or web address].    Your next appointment:    6 month(s)  Provider:    You may see  one of the following Advanced Practice Providers on your designated Care Team:   [name], NP    Other Instructions

## 2023-08-03 ENCOUNTER — Ambulatory Visit: Payer: Commercial Managed Care - HMO | Attending: Internal Medicine

## 2023-08-03 DIAGNOSIS — I5022 Chronic systolic (congestive) heart failure: Secondary | ICD-10-CM | POA: Diagnosis not present

## 2023-08-03 DIAGNOSIS — Z95810 Presence of automatic (implantable) cardiac defibrillator: Secondary | ICD-10-CM | POA: Diagnosis not present

## 2023-08-05 NOTE — Progress Notes (Signed)
 EPIC Encounter for ICM Monitoring  Patient Name: Alison Howard is a 63 y.o. female Date: 08/05/2023 Primary Care Physican: Alison Elsie SAUNDERS, MD Primary Cardiologist: Alison Howard Electrophysiologist: Alison Howard BiV Pacing: 93% 12/30/2021 Weight: 230 lbs 04/23/2022 Weight: 240 lbs 08/29/2022 Office Weight: 241 lbs 02/03/2023 Weight: 235 lbs 07/31/2023 Office Weight: 224 lbs    Spoke with patient and heart failure questions reviewed.  Transmission results reviewed.  Pt asymptomatic for fluid accumulation.   She reports hospitalization for increased heart rate and pneumonia but feeling better.  She does still have a cough with some congest and tired.     Corvue thoracic impedance suggesting possible fluid accumulation from 12/24-1/2.   Prescribed:  Furosemide  20 mg Take 1 tablet (20 mg) by mouth every other day (decreased after starting Jardiance ).  Potassium 20 mEq take 1 tablet by mouth daily.   Labs: 07/18/2023 Creatinine 0.98, BUN 11, Potassium 3.5, Sodium 134, GFR >60  07/17/2023 Creatinine 0.88, BUN 9, Potassium 3.6, Sodium 136, GFR >60  07/16/2023 Creatinine 1.02, BUN 9, Potassium 4.0, Sodium 132, GFR >60  07/14/2023 Creatinine 1.10, BUN 8, Potassium 4.2, Sodium 133  06/29/2023 Creatinine 0.98, BUN 9, Potassium 4.1, Sodium 138, GFR >60  06/17/2023 Creatinine 1.02, BUN 15, Potassium 4.4, Sodium 137, GFR >60  03/20/2023 Creatinine 1.20, BUN 13, Potassium 4.9, Sodium 140  08/29/2022 Creatinine 1.17, BUN 18, Potassium 4.2, Sodium 134, GFR 53 09/27/2021 Creatinine 1.21, BUN 11, Potassium 4.3, Sodium 136, GFR 51 A complete set of results can be found in Results Review.   Recommendations:  No changes and encouraged to call if experiencing any fluid symptoms.   Follow-up plan: ICM clinic phone appointment on 09/07/2023.   91 day device clinic remote transmission 10/02/2023.     EP/Cardiology next office visit:  Recall 09/16/2023 with Alison Passey, PA.  08/28/2023 with Dr Alison Howard.     Copy of ICM check  sent to Dr. Fernande.     3 month ICM trend: 08/03/2023.    12-14 Month ICM trend:     Alison GORMAN Garner, RN 08/05/2023 4:22 PM

## 2023-08-15 ENCOUNTER — Other Ambulatory Visit (HOSPITAL_COMMUNITY): Payer: Self-pay

## 2023-08-20 ENCOUNTER — Other Ambulatory Visit: Payer: Self-pay

## 2023-08-20 MED ORDER — MEXILETINE HCL 200 MG PO CAPS: 200.0000 mg | ORAL_CAPSULE | Freq: Three times a day (TID) | ORAL | 3 refills | Status: DC

## 2023-08-21 ENCOUNTER — Other Ambulatory Visit (HOSPITAL_COMMUNITY): Payer: Self-pay

## 2023-08-21 DIAGNOSIS — I5022 Chronic systolic (congestive) heart failure: Secondary | ICD-10-CM

## 2023-08-21 MED ORDER — EMPAGLIFLOZIN 10 MG PO TABS: 10.0000 mg | ORAL_TABLET | Freq: Every day | ORAL | 3 refills | Status: AC

## 2023-08-21 MED ORDER — BISOPROLOL FUMARATE 5 MG PO TABS: 5.0000 mg | ORAL_TABLET | Freq: Two times a day (BID) | ORAL | 3 refills | Status: AC

## 2023-08-26 ENCOUNTER — Other Ambulatory Visit (HOSPITAL_COMMUNITY): Payer: Self-pay

## 2023-08-26 DIAGNOSIS — I5022 Chronic systolic (congestive) heart failure: Secondary | ICD-10-CM

## 2023-08-26 MED ORDER — PRAVASTATIN SODIUM 80 MG PO TABS: 80.0000 mg | ORAL_TABLET | Freq: Every day | ORAL | 1 refills | Status: DC

## 2023-08-26 MED ORDER — EPLERENONE 50 MG PO TABS: 50.0000 mg | ORAL_TABLET | Freq: Every day | ORAL | 3 refills | Status: AC

## 2023-08-28 ENCOUNTER — Encounter (HOSPITAL_COMMUNITY): Payer: Self-pay | Admitting: Cardiology

## 2023-08-28 ENCOUNTER — Ambulatory Visit (HOSPITAL_COMMUNITY)
Admission: RE | Admit: 2023-08-28 | Discharge: 2023-08-28 | Disposition: A | Discharge status: Home / Self Care | Payer: Commercial Managed Care - HMO | Source: Ambulatory Visit | Attending: Cardiology | Admitting: Cardiology

## 2023-08-28 VITALS — BP 104/70 | HR 70 | Wt 224.2 lb

## 2023-08-28 DIAGNOSIS — Z6841 Body Mass Index (BMI) 40.0 and over, adult: Secondary | ICD-10-CM | POA: Diagnosis not present

## 2023-08-28 DIAGNOSIS — Z79899 Other long term (current) drug therapy: Secondary | ICD-10-CM | POA: Insufficient documentation

## 2023-08-28 DIAGNOSIS — I428 Other cardiomyopathies: Secondary | ICD-10-CM | POA: Diagnosis not present

## 2023-08-28 DIAGNOSIS — I5022 Chronic systolic (congestive) heart failure: Secondary | ICD-10-CM | POA: Diagnosis not present

## 2023-08-28 DIAGNOSIS — E669 Obesity, unspecified: Secondary | ICD-10-CM | POA: Diagnosis not present

## 2023-08-28 DIAGNOSIS — Z950 Presence of cardiac pacemaker: Secondary | ICD-10-CM | POA: Diagnosis not present

## 2023-08-28 DIAGNOSIS — E785 Hyperlipidemia, unspecified: Secondary | ICD-10-CM | POA: Diagnosis not present

## 2023-08-28 DIAGNOSIS — I493 Ventricular premature depolarization: Secondary | ICD-10-CM | POA: Insufficient documentation

## 2023-08-28 DIAGNOSIS — Z7984 Long term (current) use of oral hypoglycemic drugs: Secondary | ICD-10-CM | POA: Insufficient documentation

## 2023-08-28 DIAGNOSIS — G4733 Obstructive sleep apnea (adult) (pediatric): Secondary | ICD-10-CM | POA: Insufficient documentation

## 2023-08-28 LAB — BASIC METABOLIC PANEL
Anion gap: 8 (ref 5–15)
BUN: 8 mg/dL (ref 8–23)
CO2: 27 mmol/L (ref 22–32)
Calcium: 9.1 mg/dL (ref 8.9–10.3)
Chloride: 104 mmol/L (ref 98–111)
Creatinine, Ser: 0.99 mg/dL (ref 0.44–1.00)
GFR, Estimated: >60 mL/min (ref >60)
Glucose, Bld: 101 mg/dL — ABNORMAL HIGH (ref 70–99)
Potassium: 4 mmol/L (ref 3.5–5.1)
Sodium: 139 mmol/L (ref 135–145)

## 2023-08-28 LAB — BRAIN NATRIURETIC PEPTIDE: B Natriuretic Peptide: 134 pg/mL — ABNORMAL HIGH (ref 0.0–100.0)

## 2023-08-28 NOTE — Patient Instructions (Signed)
There has been no changes to your medications.  Labs done today, your results will be available in MyChart, we will contact you for abnormal readings.   Cardiac Sarcoidosis/Inflammation PET Scan Patient Instructions  Please report to Radiology at the [facility] Main Entrance 15 minutes early for your test.  [address] [place], [place] [address] BRING FOOD DIARY WITH YOU TO THIS APPOINTMENT For 24 hours before the test: Do not exercise! Do not eat after 5 pm the day before your test! To make sure that your scanning results are accurate, you MUST follow the sarcoid prep meal diet starting the day before your PET scan. This diet involves eating no carbohydrates 24 hours before the test.  You will keep a log of all that you eat the day before your test. If you have questions or do not understand this diet, please call [phone] for more information. If you are unable to follow this diet, please discuss an alternative strategy with the coordinator.  If you are diabetic, continue your diabetes medications as usual on the day before until you begin to fast. NO DIABETES MEDICATIONS ONCE YOU BEGIN TO FAST. What foods can I eat the day before my test?  Drink only water or black coffee (WITHOUT sugar, artificial sweetener, cream, or milk). Eggs (prepared without milk or cheese)  Meat that is either broiled or pan fried in butter WITHOUT breading (chicken, [place], bacon, meat-only sausage, hamburger, steak, fish) Butter, salt & pepper What foods must I AVOID the day before my test?  Do not consume alcoholic beverages, sodas, fruit juice, coffee creamer, or sports drinks  Do not eat vegetables, beans, nuts, fruits, juices, bread, grains, rice, pasta, potatoes, or any baked goods Do not eat dairy products (milk, cheese, etc.)  Do not eat mayonnaise, ketchup, tartar sauce, mustard, or other condiments Do not add sugar, artificial sweeteners, or Splenda (sucralose) to foods or drinks   Do not eat breaded foods (like fried chicken)  Do not eat sweets, candy, gum, sweetened cough drops, lozenges, or sugar  Do not eat sweetened, grilled, or cured meats or meat with carbohydrate-containing additives (some sausages, ham, sweetened bacon) Suggested items for breakfast, lunch, or dinner:  Breakfast  3 to 5 fatty sausage links fried in butter. 3 to 5 bacon strips.  3 eggs pan fried in butter (no milk or cheese).  Lunch/Dinner  2 hamburger patties fried in butter. Chicken or fatty fish pan fried in butter. No breading. 8 oz. fatty steak pan fried in butter.  Beverages  Drink only water or black coffee. DO NOT ADD SUGAR, ARTIFICIAL SWEETENER, CREAM, OR MILK  Please call [facility] to cancel or reschedule your appointment at [phone] For more information and frequently asked questions, please visit our website : [email or web address]   Cardiac Sarcoidosis/Inflammation PET Scan  Food Diary Name: _____________________________ Please fill in EXACTLY what you have eaten and when for 24 hours PRIOR to your test date.  Time Food/Drink Comments  Breakfast                Lunch                Dinner                Snacks                 DO NOT EXERCISE THE DAY BEFORE YOUR TEST DO NOT EAT AFTER 5 PM THE DAY BEFORE YOUR TEST.  ON THE DAY OF YOUR TEST, DO NOT EAT ANY FOOD AND ONLY DRINK CLEAR WATER! PLEASE BRING THIS FOOD DIARY WITH YOU TO YOUR APPOINTMENT  Your physician recommends that you schedule a follow-up appointment in: 2 months.  If you have any questions or concerns before your next appointment please send [place] a message through [place] or call our office at [phone].    TO LEAVE A MESSAGE FOR THE NURSE SELECT OPTION 2, PLEASE LEAVE A MESSAGE INCLUDING: YOUR NAME DATE OF BIRTH CALL BACK NUMBER REASON FOR CALL**this is important as we prioritize the call backs  YOU WILL RECEIVE A CALL BACK THE SAME DAY AS LONG AS YOU CALL  BEFORE 4:00 PM  At the [facility], you and your health needs are our priority. As part of our continuing mission to provide you with exceptional heart care, we have created designated Provider Care Teams. These Care Teams include your primary Cardiologist (physician) and Advanced Practice Providers (APPs- Physician Assistants and Nurse Practitioners) who all work together to provide you with the care you need, when you need it.   You may see any of the following providers on your designated Care Team at your next follow up: Dr Arvilla Meres Dr Marca Ancona Dr. Dorthula Nettles Dr. Clearnce Hasten Amy Filbert Schilder, NP Robbie Lis, [place] [facility] [place], [place] Brynda Peon, NP [place] Lee, NP Karle Plumber, PharmD   Please be sure to bring in all your medications bottles to every appointment.    Thank you for choosing [facility]

## 2023-08-30 NOTE — Progress Notes (Signed)
ID:  KLAIRA PESCI, DOB 1960/12/13, MRN [id number]   Provider location: [place] [facility] Type of Visit: Established patient  PCP:  Johny Blamer, MD  HF Cardiologist:  Dr. Shirlee Latch  Chief Complaint: CHF   HPI: Alison Howard is a 63 y.o. female who has a history of CHF and VT and was referred by Dr. Graciela Husbands for evaluation of CHF. She has a long history of cardiomyopathy, dating back to at least 2013.  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  She has had VT in the past terminated by ATP.  She has also had up to 22% PVCs in the past, down to 3.1% on 10/19 holter. She is currently on flecainide to suppress the PVCs.  Echo in 12/19 showed severe dilation of the LV with EF 25-30%.    RHC/LHC in 3/20 showed normal filling pressures, CI 2.37, and no significant CAD. CPX in 3/20 showed no significant HF limitation, main problem appeared to be deconditioning.  Echo in 5/22 showed EF 35-40% with basal inferoseptal, basal anteroseptal, and basal inferior akinesis.   She was admitted in 1/23 with CHF and runs of NSVT.  ICD did not discharge. She was diuresed and flecainide was stopped and replaced with amiodarone. No further VT.   Attempted VT ablation in 3/23 but unsuccessful.   Echo in 11/24 showed EF 40% with mild LV dilation, basal-mid septal akinesis, normal RV, IVC normal.   She was admitted in 12/24 with VT requiring ATP and defibrillation.  This occurred in setting of infection/PNA with non-COVID coronavirus. Echo showed EF 40%, basal septal akinesis, normal RV.  Mexiletine was started.   Today she returns for HF follow up. Weight is down 10 lbs. She has not been using Lasix.  Still has some coughing from her PNA but not short of breath with usual activities.  She has slept on 3 pillows since her PNA, coughs if she lies back more.  No chest pain.  No lightheadedness.     St Jude CRT-D device: No further VT, 98% BiV pacing, stable thoracic impedance.   Labs (1/25): NSR, BiV pacing     Labs (3/20): K 4.3, creatinine 0.88 Labs (6/20): K 4.4, creatinine 1.02 Labs (11/20): K 4.5 creatinine 0.88, hgb 14.4 Labs (9/21): K 4, creatinine 0.96, LDL 185, HDL 70 Labs (2/22): LDL 173, K 4.2, creatinine 0.86 Labs (1/23): K 4.3, creatinine 0.85, TSH and free T4 elevated, LDL 153 Labs (3/23): K 4.3, creatinine 1.21 Labs (12/23): K 4.2, creatinine 1.0 Labs (8/24): K 4.9, creatinine 1.2 Labs (12/24): K 3.5, creatinine 0.98  PMH:  1. Chronic systolic CHF: Nonischemic cardiomyopathy. St Jude BiV ICD.  - Echo (8/13): EF 40-45% - Echo (12/15): EF 25-30% - Echo (6/17): EF 35% - Echo (6/18): EF 40-45% - Echo (11/18): EF 35-40% - Echo (12/19): EF 25-30%, severe LV dilation, mild MR.  - RHC/LHC (3/20): no CAD.  Mean RA 4, PA 27/10 mean 16, mean PCWP 5, CI 2.37.  - CPX (3/20): peak VO2 16.9 (97% predicted), VE/VCO2 23, RER 1.14 => no significant HF limitation, primarily limited by deconditioning.  - Cardiac MRI (6/20): Very difficult study due to pacemaker artifact.  EF 34%, delayed enhancement images not interpretable.  - Echo (5/22): EF 35-40% with basal inferoseptal, basal anteroseptal, and basal inferior akinesis.  - Echo (11/24): EF 40% with mild LV dilation, basal-mid septal akinesis, normal RV, IVC normal.  - Echo (12/24): EF 40%, basal septal akinesis, normal RV.  2. PVCs/VT:  - 6/18 holter 22% PVCs - 10/19 holter 3.1% PVCs - s/p unsuccessful VT ablation 3/23. - VT 12/24, mexiletine started.  3. OSA 4. Hyperlipidemia: Unable to tolerate statins, Zetia.  5. Obesity  Current Outpatient Medications  Medication Sig Dispense Refill   bisoprolol (ZEBETA) 5 MG tablet Take 1 tablet (5 mg total) by mouth 2 (two) times daily. 180 tablet 3   empagliflozin (JARDIANCE) 10 MG TABS tablet Take 1 tablet (10 mg total) by mouth daily before breakfast. 90 tablet 3   ENTRESTO 24-26 MG TAKE 1 TABLET BY MOUTH TWICE A DAY 180 tablet 3   eplerenone (INSPRA) 50 MG tablet Take 1 tablet (50 mg total)  by mouth daily. 90 tablet 3   furosemide (LASIX) 20 MG tablet Take 20 mg by mouth as needed.     levothyroxine (SYNTHROID) 50 MCG tablet 1 tablet in the morning on an empty stomach Orally Once a day alternating with 75 mcg every other day for 90 days     levothyroxine (SYNTHROID) 75 MCG tablet 1 tablet in the morning on an empty stomach Orally Every other day alternating with levothyroxine 50 mcg     LORazepam (ATIVAN) 0.5 MG tablet Take 0.5 mg by mouth 2 (two) times daily as needed for anxiety.     magnesium oxide (MAG-OX) 400 (240 Mg) MG tablet Take 1 tablet (400 mg total) by mouth 2 (two) times daily.     mexiletine (MEXITIL) 200 MG capsule Take 1 capsule (200 mg total) by mouth every 8 (eight) hours. 270 capsule 3   pantoprazole (PROTONIX) 40 MG tablet Take 40 mg by mouth 2 (two) times daily.      potassium chloride SA (KLOR-CON M) 20 MEQ tablet Take 20 mEq by mouth as needed.     pravastatin (PRAVACHOL) 80 MG tablet Take 1 tablet (80 mg total) by mouth daily. 90 tablet 1   sertraline (ZOLOFT) 50 MG tablet Take 1 tablet (50 mg total) by mouth daily. 30 tablet 1   No current facility-administered medications for this encounter.   Allergies:   Ciprofloxacin hcl, Codeine, Morphine and codeine, Sulfa antibiotics, Zetia [ezetimibe], Atorvastatin, Dapagliflozin, Dextromethorphan, Rosuvastatin, Simvastatin, and Welchol [colesevelam]   Social History:  The patient  reports that she has never smoked. She has never used smokeless tobacco. She reports current alcohol use. She reports that she does not use drugs.   Family History:  The patient's family history includes Coronary artery disease in her mother; Heart attack in her mother; Heart disease in her father; Hyperlipidemia in her sister.   ROS:  Please see the history of present illness.   All other systems are personally reviewed and negative.   Exam:   BP 104/70   Pulse 70   Wt 101.7 kg (224 lb 3.2 oz)   SpO2 97%   BMI 41.01 kg/m   General: NAD Neck: No JVD, no thyromegaly or thyroid nodule.  Lungs: Clear to auscultation bilaterally with normal respiratory effort. CV: Nondisplaced PMI.  Heart regular S1/S2, no S3/S4, no murmur.  No peripheral edema.  No carotid bruit.  Normal pedal pulses.  Abdomen: Soft, nontender, no hepatosplenomegaly, no distention.  Skin: Intact without lesions or rashes.  Neurologic: Alert and oriented x 3.  Psych: Normal affect. Extremities: No clubbing or cyanosis.  HEENT: Normal.   Recent Labs: 07/14/2023: ALT 26 07/15/2023: Magnesium 1.8; TSH 4.307 07/17/2023: Hemoglobin 12.9; Platelets 394 08/28/2023: B Natriuretic Peptide 134.0; BUN 8; Creatinine, Ser 0.99; Potassium 4.0; Sodium 139  Personally reviewed   Wt Readings from Last 3 Encounters:  08/28/23 101.7 kg (224 lb 3.2 oz)  07/31/23 101.8 kg (224 lb 6.4 oz)  07/18/23 102.6 kg (226 lb 3.1 oz)   ASSESSMENT AND PLAN: 1. Chronic systolic CHF: Long history of nonischemic cardiomyopathy.  Echo in 10/19 with severe LV dilation and EF 25-30%.  Has had frequent PVCs in the past, but holter in 10/19 showed PVC count down to 3.1%.  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 RHC/LHC in 3/20 with no significant CAD, normal feeling pressures, and preserved cardiac output.  CPX showed deconditioning but no significant HF limitation. Cardiac MRI in 6/20 was a very difficult study due to pacemaker artifact, LV EF 34% but LGE was not interpretable.  Echo in 5/22 showed EF 35-40% with basal inferoseptal, basal anteroseptal, and basal inferior akinesis. Echo in 11/24 and again in 12/24 showed EF 40% with mild LV dilation, basal-mid septal akinesis, normal RV, IVC normal.  NYHA class II, not volume overloaded by exam or Corvue.  - Continue Entresto 24/26 mg bid (orthostatic symptoms on higher doses).  - Continue bisoprolol 5 mg bid.  - Continue eplerenone 50 mg daily.  - Continue Jardiance 10 mg daily.  - She has not needed Lasix.  - With VT and basal septal  akinesis, need to consider cardiac sarcoidosis.  I will arrange for cardiac PET to assess for inflammation/evidence for cardiac sarcoidosis.  2. PVCs: Count down to 3.1% with holter in 10/19 but EF remained 25-30% by echo. Suspect not primarily a PVC-mediated CMP therefore.  PVCs likely are a consequence of the cardiomyopathy.  She had symptomatic NSVT runs in 1/23 and flecainide was stopped, amiodarone started.  Now off amiodarone.  Unsuccessful attempt at VT ablation in 3/23.  Now on mexiletine.  3. Obesity: Body mass index is 41.01 kg/m. Continue efforts at diet/exercise for weight loss.  - Will not refer for semaglutide as she does not want to use injectable meds. 4. Hyperlipidemia: High LDL in past. She will not use any injectable meds.   She is on pravastatin 80 mg daily. She cannot tolerate other statins or Zetia.  5. OSA: Unable to tolerate CPAP. 6. VT: She is now on mexiletine 200 mg tid.  - Will check with Dr. Elberta Fortis to see if this can be decreased to bid.   Follow up in 2 months with APP  I spent 32 minutes reviewing records, interviewing/examining patient, and managing orders.    Signed, Marca Ancona, MD  08/30/2023  [facility] [address] [facility] [place] [address] [phone] (office) [phone] (fax)

## 2023-09-07 ENCOUNTER — Ambulatory Visit: Payer: Commercial Managed Care - HMO | Attending: Internal Medicine

## 2023-09-07 DIAGNOSIS — Z95810 Presence of automatic (implantable) cardiac defibrillator: Secondary | ICD-10-CM | POA: Diagnosis not present

## 2023-09-07 DIAGNOSIS — I5022 Chronic systolic (congestive) heart failure: Secondary | ICD-10-CM

## 2023-09-08 ENCOUNTER — Encounter (HOSPITAL_COMMUNITY): Payer: Self-pay | Admitting: Cardiology

## 2023-09-09 ENCOUNTER — Telehealth (HOSPITAL_COMMUNITY): Payer: Self-pay

## 2023-09-09 ENCOUNTER — Telehealth (HOSPITAL_COMMUNITY): Payer: Self-pay | Admitting: *Deleted

## 2023-09-09 NOTE — Telephone Encounter (Signed)
-----   Message from [facility] Chantel J sent at 09/08/2023  3:21 PM EST ----- Upcoming PET scan auth questions Received letter from insurance stating additional information is needed for upcoming procedure. Letter to patient states a requst for additional information was mailed to Dr Kathlyn [place] house.......Marland Kitchen Advised upcoming procedure do not require pre auth.    Would like to speak with person that does pre certs....................................Marland Kitchen

## 2023-09-09 NOTE — Telephone Encounter (Signed)
Reaching out to patient to offer assistance regarding upcoming cardiac imaging study; pt verbalizes understanding of appt date/time, parking situation and where to check in, pre-test NPO status and medications ordered, and verified current allergies; name and call back number provided for further questions should they arise Johney Frame RN Navigator Cardiac Imaging [facility] [phone] office [phone] cell   Patient verbalized understanding of diet prep.

## 2023-09-09 NOTE — Telephone Encounter (Signed)
ZOXW#[id number] for PET Scan

## 2023-09-09 NOTE — Progress Notes (Signed)
EPIC Encounter for ICM Monitoring  Patient Name: Alison Howard is a 63 y.o. female Date: 09/09/2023 Primary Care Physican: Noberto Retort, MD Primary Cardiologist: Shirlee Latch Electrophysiologist: Graciela Husbands BiV Pacing: 98% 12/30/2021 Weight: 230 lbs 04/23/2022 Weight: 240 lbs 08/29/2022 Office Weight: 241 lbs 02/03/2023 Weight: 235 lbs 07/31/2023 Office Weight: 224 lbs 08/28/2023 Office Weight: 224 lbs    Spoke with patient and heart failure questions reviewed.  Transmission results reviewed.  Pt asymptomatic for fluid accumulation.  Has not needed to take PRN Lasix in the last month since starting Jardiance.   Corvue thoracic impedance suggesting possible fluid accumulation from 12/24-1/2.   Prescribed:  Furosemide 20 mg Take 1 tablet (20 mg) by mouth as needed.  Potassium 20 mEq take 1 tablet by mouth as needed.   Labs: 08/28/2023 Creatinine 0.99, BUN 8,   Potassium 4.0, Sodium 139, GFR >60 07/18/2023 Creatinine 0.98, BUN 11, Potassium 3.5, Sodium 134, GFR >60  07/17/2023 Creatinine 0.88, BUN 9, Potassium 3.6, Sodium 136, GFR >60  07/16/2023 Creatinine 1.02, BUN 9, Potassium 4.0, Sodium 132, GFR >60  A complete set of results can be found in Results Review.   Recommendations:  No changes and encouraged to call if experiencing any fluid symptoms.   Follow-up plan: ICM clinic phone appointment on 10/12/2023.   91 day device clinic remote transmission 10/02/2023.     EP/Cardiology next office visit:  10/26/2023 with HF clinic.  Recall 09/16/2023 with Otilio Saber, PA.   Copy of ICM check sent to Dr. Graciela Husbands.    3 month ICM trend: 09/07/2023.    12-14 Month ICM trend:     Karie Soda, RN 09/09/2023 4:22 PM

## 2023-09-10 ENCOUNTER — Encounter (HOSPITAL_COMMUNITY)
Admission: RE | Admit: 2023-09-10 | Discharge: 2023-09-10 | Disposition: A | Discharge status: Home / Self Care | Payer: Commercial Managed Care - HMO | Source: Ambulatory Visit | Attending: Cardiology | Admitting: Cardiology

## 2023-09-10 ENCOUNTER — Telehealth (HOSPITAL_COMMUNITY): Payer: Self-pay | Admitting: Cardiology

## 2023-09-10 DIAGNOSIS — I5022 Chronic systolic (congestive) heart failure: Secondary | ICD-10-CM | POA: Insufficient documentation

## 2023-09-10 LAB — NM PET CT MYOCARDIAL SARCOIDOSIS
Nuc Stress EF: 39 %
Rest Nuclear Isotope Dose: 26.7 mCi

## 2023-09-10 MED ORDER — FLUDEOXYGLUCOSE F - 18 (FDG) INJECTION
8.9000 | Freq: Once | INTRAVENOUS | Status: AC | PRN
  Administered 2023-09-10: 8.9 via INTRAVENOUS

## 2023-09-10 MED ORDER — MEXILETINE HCL 250 MG PO CAPS: 250.0000 mg | ORAL_CAPSULE | Freq: Two times a day (BID) | ORAL | 3 refills | Status: AC

## 2023-09-10 MED ORDER — RUBIDIUM RB82 GENERATOR (RUBYFILL)
26.7000 | PACK | Freq: Once | INTRAVENOUS | Status: AC
  Administered 2023-09-10: 26.7 via INTRAVENOUS

## 2023-09-10 NOTE — Telephone Encounter (Signed)
LMOM

## 2023-09-10 NOTE — Telephone Encounter (Signed)
-----   Message from Marca Ancona sent at 09/08/2023  2:39 PM EST ----- Please let her know that I talked to Dr. Elberta Fortis, she can change her mexiletine to 250 mg bid. ----- Message ----- From: Regan Lemming, MD Sent: 09/08/2023   9:39 AM EST To: Laurey Morale, MD; Baird Lyons, RN  Could try BID dosing, seems reasonable. Would increase to 250 BID if she wants to change. ----- Message ----- From: Laurey Morale, MD Sent: 08/30/2023  10:51 PM EST To: Regan Lemming, MD  Can this patient's mexiletine be decreased to 200 mg bid (or does she need to continue on tid dosing)?

## 2023-09-10 NOTE — Telephone Encounter (Signed)
PT AWARE. VOICED UNDERSTANDING.

## 2023-09-17 ENCOUNTER — Encounter (INDEPENDENT_AMBULATORY_CARE_PROVIDER_SITE_OTHER): Payer: Self-pay

## 2023-09-18 ENCOUNTER — Other Ambulatory Visit (HOSPITAL_COMMUNITY): Payer: Self-pay | Admitting: Family Medicine

## 2023-10-02 ENCOUNTER — Ambulatory Visit (INDEPENDENT_AMBULATORY_CARE_PROVIDER_SITE_OTHER): Payer: Self-pay

## 2023-10-02 DIAGNOSIS — I472 Ventricular tachycardia, unspecified: Secondary | ICD-10-CM | POA: Diagnosis not present

## 2023-10-04 LAB — CUP PACEART REMOTE DEVICE CHECK
Battery Remaining Longevity: 29 mo
Battery Remaining Percentage: 32 %
Battery Voltage: 2.9 V
Brady Statistic AP VP Percent: 2.5 %
Brady Statistic AP VS Percent: 1 %
Brady Statistic AS VP Percent: 95 %
Brady Statistic AS VS Percent: 1 %
Brady Statistic RA Percent Paced: 1.3 %
Date Time Interrogation Session: 20250307020018
HighPow Impedance: 74 Ohm
HighPow Impedance: 75 Ohm
Implantable Lead Connection Status: 753985
Implantable Lead Connection Status: 753985
Implantable Lead Connection Status: 753985
Implantable Lead Implant Date: 20120529
Implantable Lead Implant Date: 20120529
Implantable Lead Implant Date: 20120529
Implantable Lead Location: 753858
Implantable Lead Location: 753859
Implantable Lead Location: 753860
Implantable Pulse Generator Implant Date: 20190724
Lead Channel Impedance Value: 1125 Ohm
Lead Channel Impedance Value: 460 Ohm
Lead Channel Impedance Value: 550 Ohm
Lead Channel Pacing Threshold Amplitude: 0.75 V
Lead Channel Pacing Threshold Amplitude: 1.5 V
Lead Channel Pacing Threshold Amplitude: 1.625 V
Lead Channel Pacing Threshold Pulse Width: 0.5 ms
Lead Channel Pacing Threshold Pulse Width: 0.5 ms
Lead Channel Pacing Threshold Pulse Width: 0.5 ms
Lead Channel Sensing Intrinsic Amplitude: 11.4 mV
Lead Channel Sensing Intrinsic Amplitude: 3.3 mV
Lead Channel Setting Pacing Amplitude: 2 V
Lead Channel Setting Pacing Amplitude: 2.125
Lead Channel Setting Pacing Amplitude: 2.25 V
Lead Channel Setting Pacing Pulse Width: 0.5 ms
Lead Channel Setting Pacing Pulse Width: 0.5 ms
Lead Channel Setting Sensing Sensitivity: 0.5 mV
Pulse Gen Serial Number: 9842506

## 2023-10-12 ENCOUNTER — Ambulatory Visit: Payer: Commercial Managed Care - HMO | Attending: Internal Medicine

## 2023-10-12 DIAGNOSIS — Z95810 Presence of automatic (implantable) cardiac defibrillator: Secondary | ICD-10-CM

## 2023-10-12 DIAGNOSIS — I5022 Chronic systolic (congestive) heart failure: Secondary | ICD-10-CM | POA: Diagnosis not present

## 2023-10-14 ENCOUNTER — Telehealth: Payer: Self-pay

## 2023-10-14 NOTE — Progress Notes (Signed)
 EPIC Encounter for ICM Monitoring  Patient Name: Alison Howard is a 63 y.o. female Date: 10/14/2023 Primary Care Physican: Noberto Retort, MD Primary Cardiologist: Shirlee Latch Electrophysiologist: Graciela Husbands BiV Pacing: 98% 12/30/2021 Weight: 230 lbs 04/23/2022 Weight: 240 lbs 08/29/2022 Office Weight: 241 lbs 02/03/2023 Weight: 235 lbs 07/31/2023 Office Weight: 224 lbs 08/28/2023 Office Weight: 224 lbs    Attempted call to patient and unable to reach.  Left detailed message per DPR regarding transmission.  Transmission results reviewed.    Corvue thoracic impedance suggesting normal fluid levels with the exception of possible fluid accumulation from 2/25-2/28.   Prescribed:  Furosemide 20 mg Take 1 tablet (20 mg) by mouth as needed.  Potassium 20 mEq take 1 tablet by mouth as needed.   Labs: 08/28/2023 Creatinine 0.99, BUN 8,   Potassium 4.0, Sodium 139, GFR >60 07/18/2023 Creatinine 0.98, BUN 11, Potassium 3.5, Sodium 134, GFR >60  07/17/2023 Creatinine 0.88, BUN 9, Potassium 3.6, Sodium 136, GFR >60  07/16/2023 Creatinine 1.02, BUN 9, Potassium 4.0, Sodium 132, GFR >60  A complete set of results can be found in Results Review.   Recommendations:  Left voice mail with ICM number and encouraged to call if experiencing any fluid symptoms.   Follow-up plan: ICM clinic phone appointment on 11/16/2023.   91 day device clinic remote transmission 01/01/2024.     EP/Cardiology next office visit:  10/26/2023 with HF clinic.  Recall 01/27/2024 with Canary Brim, NP.    Copy of ICM check sent to Dr. Graciela Husbands.   3 month ICM trend: 10/12/2023.    12-14 Month ICM trend:     Karie Soda, RN 10/14/2023 4:36 PM

## 2023-10-14 NOTE — Telephone Encounter (Signed)
 Remote ICM transmission received.  Attempted call to patient regarding ICM remote transmission and left detailed message per DPR.  Left ICM phone number and advised to return call for any fluid symptoms or questions. Next ICM remote transmission scheduled 11/16/2023.

## 2023-10-23 ENCOUNTER — Telehealth (HOSPITAL_COMMUNITY): Payer: Self-pay

## 2023-10-23 NOTE — Telephone Encounter (Signed)
 Called to confirm/remind patient of their appointment at the [facility] on 10/26/23.   Appointment:   [] Confirmed  [x] Left mess   [] No answer/No voice mail  [] Phone not in service  And to bring in all medications and/or complete list.

## 2023-10-25 NOTE — Progress Notes (Incomplete)
 ADVANCED HF CLINIC NOTE    Provider location: [facility] Type of Visit: Established patient  PCP:  Johny Blamer, MD  HF Cardiologist:  Dr. Shirlee Latch  Chief Complaint: CHF   HPI: Alison Howard is a 63 y.o. female who has a history of CHF and VT. She has a long history of cardiomyopathy, dating back to at least 2013.  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  She has had VT in the past terminated by ATP.  She has also had up to 22% PVCs in the past, down to 3.1% on 10/19 holter. She is currently on flecainide to suppress the PVCs.  Echo in 12/19 showed severe dilation of the LV with EF 25-30%.    RHC/LHC in 3/20 showed normal filling pressures, CI 2.37, and no significant CAD. CPX in 3/20 showed no significant HF limitation, main problem appeared to be deconditioning.  Echo in 5/22 showed EF 35-40% with basal inferoseptal, basal anteroseptal, and basal inferior akinesis.   She was admitted in 1/23 with CHF and runs of NSVT.  ICD did not discharge. She was diuresed and flecainide was stopped and replaced with amiodarone. No further VT.   Attempted VT ablation in 3/23 but unsuccessful.   Echo in 11/24 showed EF 40% with mild LV dilation, basal-mid septal akinesis, normal RV, IVC normal.   She was admitted in 12/24 with VT requiring ATP and defibrillation.  This occurred in setting of infection/PNA with non-COVID coronavirus. Echo showed EF 40%, basal septal akinesis, normal RV.  Mexiletine was started.   1/25 she returns for HF follow up. Weight is down 10 lbs. She has not been using Lasix.  Still has some coughing from her PNA but not short of breath with usual activities.  She has slept on 3 pillows since her PNA, coughs if she lies back more.  No chest pain.  No lightheadedness.       She returns today for HF follow up. Overall feeling well. NYHA ***. Denies {Symptoms; cardiac:12860::"chest pain","dyspnea","fatigue","near-syncope","orthopnea","palpitations","***"}.  Appetite okay. Able to perform ADLs. Appetite okay. Weight at home ***. BP at home***. Compliant with all medications.        PMH:  1. Chronic systolic CHF: Nonischemic cardiomyopathy. St Jude BiV ICD.  - Echo (8/13): EF 40-45% - Echo (12/15): EF 25-30% - Echo (6/17): EF 35% - Echo (6/18): EF 40-45% - Echo (11/18): EF 35-40% - Echo (12/19): EF 25-30%, severe LV dilation, mild MR.  - RHC/LHC (3/20): no CAD.  Mean RA 4, PA 27/10 mean 16, mean PCWP 5, CI 2.37.  - CPX (3/20): peak VO2 16.9 (97% predicted), VE/VCO2 23, RER 1.14 => no significant HF limitation, primarily limited by deconditioning.  - Cardiac MRI (6/20): Very difficult study due to pacemaker artifact.  EF 34%, delayed enhancement images not interpretable.  - Echo (5/22): EF 35-40% with basal inferoseptal, basal anteroseptal, and basal inferior akinesis.  - Echo (11/24): EF 40% with mild LV dilation, basal-mid septal akinesis, normal RV, IVC normal.  - Echo (12/24): EF 40%, basal septal akinesis, normal RV. 2. PVCs/VT:  - 6/18 holter 22% PVCs - 10/19 holter 3.1% PVCs - s/p unsuccessful VT ablation 3/23. - VT 12/24, mexiletine started.  3. OSA 4. Hyperlipidemia: Unable to tolerate statins, Zetia.  5. Obesity  Current Outpatient Medications  Medication Sig Dispense Refill   bisoprolol (ZEBETA) 5 MG tablet Take 1 tablet (5 mg total) by mouth 2 (two) times daily. 180 tablet 3   empagliflozin (  JARDIANCE) 10 MG TABS tablet Take 1 tablet (10 mg total) by mouth daily before breakfast. 90 tablet 3   ENTRESTO 24-26 MG TAKE 1 TABLET BY MOUTH TWICE A DAY 180 tablet 3   eplerenone (INSPRA) 50 MG tablet Take 1 tablet (50 mg total) by mouth daily. 90 tablet 3   furosemide (LASIX) 20 MG tablet Take 20 mg by mouth as needed.     levothyroxine (SYNTHROID) 50 MCG tablet 1 tablet in the morning on an empty stomach Orally Once a day alternating with 75 mcg every other day for 90 days     levothyroxine (SYNTHROID) 75 MCG tablet 1 tablet in  the morning on an empty stomach Orally Every other day alternating with levothyroxine 50 mcg     LORazepam (ATIVAN) 0.5 MG tablet Take 0.5 mg by mouth 2 (two) times daily as needed for anxiety.     magnesium oxide (MAG-OX) 400 (240 Mg) MG tablet Take 1 tablet (400 mg total) by mouth 2 (two) times daily.     mexiletine (MEXITIL) 250 MG capsule Take 1 capsule (250 mg total) by mouth 2 (two) times daily. 180 capsule 3   pantoprazole (PROTONIX) 40 MG tablet Take 40 mg by mouth 2 (two) times daily.      potassium chloride SA (KLOR-CON M) 20 MEQ tablet Take 20 mEq by mouth as needed.     pravastatin (PRAVACHOL) 80 MG tablet Take 1 tablet (80 mg total) by mouth daily. 90 tablet 1   sertraline (ZOLOFT) 50 MG tablet Take 1 tablet (50 mg total) by mouth daily. 30 tablet 1   No current facility-administered medications for this visit.   Allergies:   Ciprofloxacin hcl, Codeine, Morphine and codeine, Sulfa antibiotics, Zetia [ezetimibe], Atorvastatin, Dapagliflozin, Dextromethorphan, Rosuvastatin, Simvastatin, and Welchol [colesevelam]   Social History:  The patient  reports that she has never smoked. She has never used smokeless tobacco. She reports current alcohol use. She reports that she does not use drugs.   Family History:  The patient's family history includes Coronary artery disease in her mother; Heart attack in her mother; Heart disease in her father; Hyperlipidemia in her sister.   ROS:  Please see the history of present illness.   All other systems are personally reviewed and negative.    There were no vitals taken for this visit.  There were no vitals filed for this visit.  Wt Readings from Last 3 Encounters:  08/28/23 101.7 kg (224 lb 3.2 oz)  07/31/23 101.8 kg (224 lb 6.4 oz)  07/18/23 102.6 kg (226 lb 3.1 oz)    Physical Exam: General: NAD Neck: No JVD, no thyromegaly or thyroid nodule.  Lungs: Clear to auscultation bilaterally with normal respiratory effort. CV: Nondisplaced  PMI.  Heart regular S1/S2, no S3/S4, no murmur.  No peripheral edema.  No carotid bruit.  Normal pedal pulses.  Abdomen: Soft, nontender, no hepatosplenomegaly, no distention.  Skin: Intact without lesions or rashes.  Neurologic: Alert and oriented x 3.  Psych: Normal affect. Extremities: No clubbing or cyanosis.  HEENT: Normal.   St Jude CRT-D device (personally reviewed): No further VT, 98% BiV pacing, stable thoracic impedance.   ASSESSMENT AND PLAN: 1. Chronic systolic CHF: Long history of nonischemic cardiomyopathy.  Echo in 10/19 with severe LV dilation and EF 25-30%.  Has had frequent PVCs in the past, but holter in 10/19 showed PVC count down to 3.1%.  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 RHC/LHC in 3/20 with no significant CAD, normal  feeling pressures, and preserved cardiac output.  CPX showed deconditioning but no significant HF limitation. Cardiac MRI in 6/20 was a very difficult study due to pacemaker artifact, LV EF 34% but LGE was not interpretable.  Echo in 5/22 showed EF 35-40% with basal inferoseptal, basal anteroseptal, and basal inferior akinesis. Echo in 11/24 and again in 12/24 showed EF 40% with mild LV dilation, basal-mid septal akinesis, normal RV, IVC normal.  NYHA class II, not volume overloaded by exam or Corvue.  - Continue Entresto 24/26 mg bid (orthostatic symptoms on higher doses).  - Continue bisoprolol 5 mg bid.  - Continue eplerenone 50 mg daily.  - Continue Jardiance 10 mg daily.  - She has not needed Lasix.  - With VT and basal septal akinesis, need to consider cardiac sarcoidosis.  I will arrange for cardiac PET to assess for inflammation/evidence for cardiac sarcoidosis.   2. PVCs: Count down to 3.1% with holter in 10/19 but EF remained 25-30% by echo. Suspect not primarily a PVC-mediated CMP therefore.  PVCs likely are a consequence of the cardiomyopathy.  She had symptomatic NSVT runs in 1/23 and flecainide was stopped, amiodarone started.  Now off amiodarone.   Unsuccessful attempt at VT ablation in 3/23.  Now on mexiletine.   3. Obesity: There is no height or weight on file to calculate BMI. Continue efforts at diet/exercise for weight loss.  - Will not refer for semaglutide as she does not want to use injectable meds.  4. Hyperlipidemia: High LDL in past. She will not use any injectable meds.   She is on pravastatin 80 mg daily. She cannot tolerate other statins or Zetia.   5. OSA: Unable to tolerate CPAP.  6. VT: She is now on mexiletine 200 mg tid.  - Will check with Dr. Elberta Fortis to see if this can be decreased to bid.   Follow up in 2 months with APP Follow up with *** IN ***  [place] Jailen Lung, NP  10/25/2023

## 2023-10-26 ENCOUNTER — Encounter (HOSPITAL_COMMUNITY): Payer: Self-pay

## 2023-10-26 ENCOUNTER — Ambulatory Visit (HOSPITAL_COMMUNITY)
Admission: RE | Admit: 2023-10-26 | Discharge: 2023-10-26 | Disposition: A | Discharge status: Home / Self Care | Payer: Commercial Managed Care - HMO | Source: Ambulatory Visit | Attending: Cardiology | Admitting: Cardiology

## 2023-10-26 VITALS — BP 94/50 | HR 73 | Wt 222.6 lb

## 2023-10-26 DIAGNOSIS — Z95810 Presence of automatic (implantable) cardiac defibrillator: Secondary | ICD-10-CM | POA: Insufficient documentation

## 2023-10-26 DIAGNOSIS — Z6841 Body Mass Index (BMI) 40.0 and over, adult: Secondary | ICD-10-CM | POA: Insufficient documentation

## 2023-10-26 DIAGNOSIS — I428 Other cardiomyopathies: Secondary | ICD-10-CM | POA: Diagnosis not present

## 2023-10-26 DIAGNOSIS — G4733 Obstructive sleep apnea (adult) (pediatric): Secondary | ICD-10-CM | POA: Diagnosis not present

## 2023-10-26 DIAGNOSIS — I493 Ventricular premature depolarization: Secondary | ICD-10-CM | POA: Diagnosis not present

## 2023-10-26 DIAGNOSIS — I5022 Chronic systolic (congestive) heart failure: Secondary | ICD-10-CM | POA: Diagnosis not present

## 2023-10-26 DIAGNOSIS — Z8249 Family history of ischemic heart disease and other diseases of the circulatory system: Secondary | ICD-10-CM | POA: Diagnosis not present

## 2023-10-26 DIAGNOSIS — E669 Obesity, unspecified: Secondary | ICD-10-CM | POA: Insufficient documentation

## 2023-10-26 DIAGNOSIS — Z79899 Other long term (current) drug therapy: Secondary | ICD-10-CM | POA: Diagnosis not present

## 2023-10-26 DIAGNOSIS — I472 Ventricular tachycardia, unspecified: Secondary | ICD-10-CM

## 2023-10-26 DIAGNOSIS — E66813 Obesity, class 3: Secondary | ICD-10-CM | POA: Diagnosis not present

## 2023-10-26 DIAGNOSIS — E785 Hyperlipidemia, unspecified: Secondary | ICD-10-CM

## 2023-10-26 LAB — BASIC METABOLIC PANEL WITH GFR
Anion gap: 9 (ref 5–15)
BUN: 11 mg/dL (ref 8–23)
CO2: 27 mmol/L (ref 22–32)
Calcium: 9.3 mg/dL (ref 8.9–10.3)
Chloride: 103 mmol/L (ref 98–111)
Creatinine, Ser: 0.99 mg/dL (ref 0.44–1.00)
GFR, Estimated: >60 mL/min (ref >60)
Glucose, Bld: 98 mg/dL (ref 70–99)
Potassium: 4.5 mmol/L (ref 3.5–5.1)
Sodium: 139 mmol/L (ref 135–145)

## 2023-10-26 LAB — LIPID PANEL
Cholesterol: 261 mg/dL — ABNORMAL HIGH (ref 0–200)
HDL: 93 mg/dL (ref >40)
LDL Cholesterol: 130 mg/dL — ABNORMAL HIGH (ref 0–99)
Total CHOL/HDL Ratio: 2.8 ratio
Triglycerides: 188 mg/dL — ABNORMAL HIGH (ref <150)
VLDL: 38 mg/dL (ref 0–40)

## 2023-10-26 NOTE — Patient Instructions (Addendum)
 Thank you for coming in today  If you had labs drawn today, any labs that are abnormal the clinic will call you No news is good news  Medications: No changes  Follow up appointments:  Your physician recommends that you schedule a follow-up appointment in:  6 months With Dr. Shirlee Latch  Please call our office to schedule the follow-up appointment in July 2025 .    Do the following things EVERYDAY: Weigh yourself in the morning before breakfast. Write it down and keep it in a log. Take your medicines as prescribed Eat low salt foods--Limit salt (sodium) to 2000 mg per day.  Stay as active as you can everyday Limit all fluids for the day to less than 2 liters   At the [facility], you and your health needs are our priority. As part of our continuing mission to provide you with exceptional heart care, we have created designated Provider Care Teams. These Care Teams include your primary Cardiologist (physician) and Advanced Practice Providers (APPs- Physician Assistants and Nurse Practitioners) who all work together to provide you with the care you need, when you need it.   You may see any of the following providers on your designated Care Team at your next follow up: Dr Arvilla Meres Dr Marca Ancona Dr. Marcos Eke, NP Robbie Lis, [place] [facility] [place], [place] Brynda Peon, NP Karle Plumber, PharmD   Please be sure to bring in all your medications bottles to every appointment.    Thank you for choosing [facility]  If you have any questions or concerns before your next appointment please send [place] a message through [place] or call our office at [phone].    TO LEAVE A MESSAGE FOR THE NURSE SELECT OPTION 2, PLEASE LEAVE A MESSAGE INCLUDING: YOUR NAME DATE OF BIRTH CALL BACK NUMBER REASON FOR CALL**this is important as we prioritize the call backs  YOU WILL RECEIVE A CALL BACK THE SAME  DAY AS LONG AS YOU CALL BEFORE 4:00 PM

## 2023-11-03 NOTE — Addendum Note (Signed)
 Addended by: Elease Etienne A on: 11/03/2023 12:28 PM   Modules accepted: Orders

## 2023-11-03 NOTE — Progress Notes (Signed)
 Remote ICD transmission.

## 2023-11-07 ENCOUNTER — Encounter: Payer: Self-pay | Admitting: Internal Medicine

## 2023-11-16 ENCOUNTER — Ambulatory Visit: Attending: Internal Medicine

## 2023-11-16 DIAGNOSIS — Z95810 Presence of automatic (implantable) cardiac defibrillator: Secondary | ICD-10-CM

## 2023-11-16 DIAGNOSIS — I5022 Chronic systolic (congestive) heart failure: Secondary | ICD-10-CM

## 2023-11-19 NOTE — Progress Notes (Signed)
 EPIC Encounter for ICM Monitoring  Patient Name: Alison Howard is a 63 y.o. female Date: 11/19/2023 Primary Care Physican: Roselind Congo, MD Primary Cardiologist: Mitzie Anda Electrophysiologist: Rodolfo Clan BiV Pacing: 98% 12/30/2021 Weight: 230 lbs 04/23/2022 Weight: 240 lbs 08/29/2022 Office Weight: 241 lbs 02/03/2023 Weight: 235 lbs 07/31/2023 Office Weight: 224 lbs 08/28/2023 Office Weight: 224 lbs 10/26/2023 Office Weight: 222 lbs   Transmission results reviewed.    Corvue thoracic impedance suggesting normal fluid levels with the exception of possible fluid accumulation from 4/2-4/8.   Prescribed:  Furosemide  20 mg Take 1 tablet (20 mg) by mouth as needed.  Potassium 20 mEq take 1 tablet by mouth as needed.   Labs: 10/26/2023 Creatinine 0.99, BUN 11, Potassium 4.5, Sodium 139, GFR >60 08/28/2023 Creatinine 0.99, BUN 8,   Potassium 4.0, Sodium 139, GFR >60 07/18/2023 Creatinine 0.98, BUN 11, Potassium 3.5, Sodium 134, GFR >60  07/17/2023 Creatinine 0.88, BUN 9, Potassium 3.6, Sodium 136, GFR >60  07/16/2023 Creatinine 1.02, BUN 9, Potassium 4.0, Sodium 132, GFR >60  A complete set of results can be found in Results Review.   Recommendations:  No changes.   Follow-up plan: ICM clinic phone appointment on 12/22/2023.   91 day device clinic remote transmission 01/01/2024.     EP/Cardiology next office visit:  Recall 04/23/2024 with Dr Mitzie Anda.  Recall 01/27/2024 with Creighton Doffing, NP.    Copy of ICM check sent to Dr. Rodolfo Clan.   3 month ICM trend: 11/16/2023.    12-14 Month ICM trend:     Almyra Jain, RN 11/19/2023 10:54 AM

## 2023-12-16 ENCOUNTER — Other Ambulatory Visit (HOSPITAL_COMMUNITY): Payer: Self-pay | Admitting: Cardiology

## 2023-12-16 MED ORDER — ENTRESTO 24-26 MG PO TABS: 1.0000 | ORAL_TABLET | Freq: Two times a day (BID) | ORAL | 3 refills | Status: DC

## 2023-12-22 ENCOUNTER — Ambulatory Visit: Attending: Cardiology

## 2023-12-22 DIAGNOSIS — I5022 Chronic systolic (congestive) heart failure: Secondary | ICD-10-CM | POA: Diagnosis not present

## 2023-12-22 DIAGNOSIS — Z95810 Presence of automatic (implantable) cardiac defibrillator: Secondary | ICD-10-CM

## 2023-12-24 ENCOUNTER — Telehealth: Payer: Self-pay

## 2023-12-24 NOTE — Progress Notes (Signed)
 EPIC Encounter for ICM Monitoring  Patient Name: Alison Howard is a 63 y.o. female Date: 12/24/2023 Primary Care Physican: Roselind Congo, MD Primary Cardiologist: Mitzie Anda Electrophysiologist: Daneil Dunker BiV Pacing: 98% 12/30/2021 Weight: 230 lbs 04/23/2022 Weight: 240 lbs 08/29/2022 Office Weight: 241 lbs 02/03/2023 Weight: 235 lbs 07/31/2023 Office Weight: 224 lbs 08/28/2023 Office Weight: 224 lbs 10/26/2023 Office Weight: 222 lbs   Attempted call to patient and unable to reach.  Left detailed message per DPR regarding transmission.  Transmission results reviewed.    Corvue thoracic impedance suggesting normal fluid levels with the exception of possible fluid accumulation from 4/27-5/5 and 5/15-5/23.   Prescribed:  Furosemide  20 mg Take 1 tablet (20 mg) by mouth as needed.  Potassium 20 mEq take 1 tablet by mouth as needed.   Labs: 10/26/2023 Creatinine 0.99, BUN 11, Potassium 4.5, Sodium 139, GFR >60 08/28/2023 Creatinine 0.99, BUN 8,   Potassium 4.0, Sodium 139, GFR >60 07/18/2023 Creatinine 0.98, BUN 11, Potassium 3.5, Sodium 134, GFR >60  07/17/2023 Creatinine 0.88, BUN 9, Potassium 3.6, Sodium 136, GFR >60  07/16/2023 Creatinine 1.02, BUN 9, Potassium 4.0, Sodium 132, GFR >60  A complete set of results can be found in Results Review.   Recommendations: Left voice mail with ICM number and encouraged to call if experiencing any fluid symptoms.   Follow-up plan: ICM clinic phone appointment on 02/08/2024.   91 day device clinic remote transmission 01/01/2024.     EP/Cardiology next office visit:  Recall 04/23/2024 with Dr Mitzie Anda.  Recall 01/27/2024 with Creighton Doffing, NP.    Copy of ICM check sent to Dr. Daneil Dunker.   3 month ICM trend: 12/22/2023.    12-14 Month ICM trend:     Almyra Jain, RN 12/24/2023 2:20 PM

## 2023-12-24 NOTE — Telephone Encounter (Signed)
Remote ICM transmission received.  Attempted call to patient regarding ICM remote transmission and left detailed message per DPR.  Left ICM phone number and advised to return call for any fluid symptoms or questions.

## 2023-12-30 LAB — CUP PACEART REMOTE DEVICE CHECK
Battery Remaining Longevity: 28 mo
Battery Remaining Percentage: 30 %
Battery Voltage: 2.9 V
Brady Statistic AP VP Percent: 2.7 %
Brady Statistic AP VS Percent: 1 %
Brady Statistic AS VP Percent: 95 %
Brady Statistic AS VS Percent: 1 %
Brady Statistic RA Percent Paced: 1.3 %
Date Time Interrogation Session: 20250603040910
HighPow Impedance: 70 Ohm
HighPow Impedance: 70 Ohm
Implantable Lead Connection Status: 753985
Implantable Lead Connection Status: 753985
Implantable Lead Connection Status: 753985
Implantable Lead Implant Date: 20120529
Implantable Lead Implant Date: 20120529
Implantable Lead Implant Date: 20120529
Implantable Lead Location: 753858
Implantable Lead Location: 753859
Implantable Lead Location: 753860
Implantable Pulse Generator Implant Date: 20190724
Lead Channel Impedance Value: 1225 Ohm
Lead Channel Impedance Value: 460 Ohm
Lead Channel Impedance Value: 560 Ohm
Lead Channel Pacing Threshold Amplitude: 0.75 V
Lead Channel Pacing Threshold Amplitude: 1.125 V
Lead Channel Pacing Threshold Amplitude: 1.5 V
Lead Channel Pacing Threshold Pulse Width: 0.5 ms
Lead Channel Pacing Threshold Pulse Width: 0.5 ms
Lead Channel Pacing Threshold Pulse Width: 0.5 ms
Lead Channel Sensing Intrinsic Amplitude: 11 mV
Lead Channel Sensing Intrinsic Amplitude: 3.7 mV
Lead Channel Setting Pacing Amplitude: 2 V
Lead Channel Setting Pacing Amplitude: 2 V
Lead Channel Setting Pacing Amplitude: 2.25 V
Lead Channel Setting Pacing Pulse Width: 0.5 ms
Lead Channel Setting Pacing Pulse Width: 0.5 ms
Lead Channel Setting Sensing Sensitivity: 0.5 mV
Pulse Gen Serial Number: 9842506

## 2024-01-01 ENCOUNTER — Ambulatory Visit (INDEPENDENT_AMBULATORY_CARE_PROVIDER_SITE_OTHER): Payer: Self-pay

## 2024-01-01 DIAGNOSIS — I429 Cardiomyopathy, unspecified: Secondary | ICD-10-CM | POA: Diagnosis not present

## 2024-01-04 ENCOUNTER — Ambulatory Visit: Payer: Self-pay | Admitting: Cardiology

## 2024-02-08 ENCOUNTER — Ambulatory Visit: Attending: Cardiology

## 2024-02-08 DIAGNOSIS — Z95810 Presence of automatic (implantable) cardiac defibrillator: Secondary | ICD-10-CM | POA: Diagnosis not present

## 2024-02-08 DIAGNOSIS — I5022 Chronic systolic (congestive) heart failure: Secondary | ICD-10-CM | POA: Diagnosis not present

## 2024-02-12 NOTE — Progress Notes (Signed)
 EPIC Encounter for ICM Monitoring  Patient Name: Alison Howard is a 63 y.o. female Date: 02/12/2024 Primary Care Physican: Arloa Elsie SAUNDERS, MD Primary Cardiologist: Rolan Electrophysiologist: Kennyth BiV Pacing: 98% 07/31/2023 Office Weight: 224 lbs 08/28/2023 Office Weight: 224 lbs 10/26/2023 Office Weight: 222 lbs   Transmission results reviewed.    Corvue thoracic impedance suggesting normal fluid levels with the exception of possible fluid accumulation from 6/27-6/30.   Prescribed:  Furosemide  20 mg Take 1 tablet (20 mg) by mouth as needed.  Potassium 20 mEq take 1 tablet by mouth as needed.   Labs: 10/26/2023 Creatinine 0.99, BUN 11, Potassium 4.5, Sodium 139, GFR >60 08/28/2023 Creatinine 0.99, BUN 8,   Potassium 4.0, Sodium 139, GFR >60 07/18/2023 Creatinine 0.98, BUN 11, Potassium 3.5, Sodium 134, GFR >60  07/17/2023 Creatinine 0.88, BUN 9,   Potassium 3.6, Sodium 136, GFR >60  07/16/2023 Creatinine 1.02, BUN 9,   Potassium 4.0, Sodium 132, GFR >60  A complete set of results can be found in Results Review.   Recommendations: No changes.    Follow-up plan: ICM clinic phone appointment on 03/14/2024.   91 day device clinic remote transmission 04/01/2024.     EP/Cardiology next office visit:  Recall 04/23/2024 with Dr Rolan.  Recall 01/27/2024 with [name], NP.    Copy of ICM check sent to Dr. Kennyth.    3 month ICM trend: 02/08/2024.    12-14 Month ICM trend:     [name], RN 02/12/2024 3:31 PM

## 2024-02-16 NOTE — Progress Notes (Signed)
 Remote ICD transmission.

## 2024-03-14 ENCOUNTER — Ambulatory Visit: Attending: Cardiology

## 2024-03-14 DIAGNOSIS — Z95810 Presence of automatic (implantable) cardiac defibrillator: Secondary | ICD-10-CM | POA: Diagnosis not present

## 2024-03-14 DIAGNOSIS — I5022 Chronic systolic (congestive) heart failure: Secondary | ICD-10-CM

## 2024-03-15 ENCOUNTER — Telehealth: Payer: Self-pay

## 2024-03-15 NOTE — Progress Notes (Signed)
 EPIC Encounter for ICM Monitoring  Patient Name: Alison Howard is a 63 y.o. female Date: 03/15/2024 Primary Care Physican: Arloa Elsie SAUNDERS, MD Primary Cardiologist: Rolan Electrophysiologist: Kennyth BiV Pacing: 98% 07/31/2023 Office Weight: 224 lbs 08/28/2023 Office Weight: 224 lbs 10/26/2023 Office Weight: 222 lbs   Attempted call to patient and unable to reach.  Left detailed message per DPR regarding transmission.  Transmission results reviewed.    Corvue thoracic impedance suggesting possible dryness starting 8/14.  Suggesting possible fluid accumulation from 8/1-8/10.   Prescribed:  Furosemide  20 mg Take 1 tablet (20 mg) by mouth as needed.  Potassium 20 mEq take 1 tablet by mouth as needed.   Labs: 10/26/2023 Creatinine 0.99, BUN 11, Potassium 4.5, Sodium 139, GFR >60 08/28/2023 Creatinine 0.99, BUN 8,   Potassium 4.0, Sodium 139, GFR >60 07/18/2023 Creatinine 0.98, BUN 11, Potassium 3.5, Sodium 134, GFR >60  07/17/2023 Creatinine 0.88, BUN 9,   Potassium 3.6, Sodium 136, GFR >60  07/16/2023 Creatinine 1.02, BUN 9,   Potassium 4.0, Sodium 132, GFR >60  A complete set of results can be found in Results Review.   Recommendations:   Left voice mail with ICM number and encouraged to call if experiencing any fluid symptoms.   Follow-up plan: ICM clinic phone appointment on 03/21/2024 to recheck fluid levels.   91 day device clinic remote transmission 04/01/2024.     EP/Cardiology next office visit:  Recall 04/23/2024 with Dr Rolan.  Recall 01/27/2024 with [name], NP.    Copy of ICM check sent to Dr. Kennyth.    3 month ICM trend: 03/14/2024.    12-14 Month ICM trend:     [name], RN 03/15/2024 8:42 AM

## 2024-03-15 NOTE — Telephone Encounter (Signed)
 Remote ICM transmission received.  Attempted call to patient regarding ICM remote transmission and left detailed message per DPR.  Left ICM phone number and advised to return call for any changes in condition.

## 2024-03-17 ENCOUNTER — Other Ambulatory Visit (HOSPITAL_COMMUNITY): Payer: Self-pay | Admitting: Cardiology

## 2024-03-17 DIAGNOSIS — I5022 Chronic systolic (congestive) heart failure: Secondary | ICD-10-CM

## 2024-03-21 ENCOUNTER — Ambulatory Visit: Attending: Cardiology

## 2024-03-21 DIAGNOSIS — Z95810 Presence of automatic (implantable) cardiac defibrillator: Secondary | ICD-10-CM

## 2024-03-21 DIAGNOSIS — I5022 Chronic systolic (congestive) heart failure: Secondary | ICD-10-CM

## 2024-03-23 ENCOUNTER — Telehealth: Payer: Self-pay

## 2024-03-23 NOTE — Telephone Encounter (Signed)
 Remote ICM transmission received.  Attempted call to patient regarding ICM remote transmission and left detailed message per DPR.  Left ICM phone number and advised to return call for any fluid symptoms or questions. Next ICM remote transmission scheduled 03/29/2024.

## 2024-03-23 NOTE — Progress Notes (Signed)
 EPIC Encounter for ICM Monitoring  Patient Name: Alison Howard is a 63 y.o. female Date: 03/23/2024 Primary Care Physican: Arloa Elsie SAUNDERS, MD Primary Cardiologist: Rolan Electrophysiologist: Kennyth BiV Pacing: 98% 07/31/2023 Office Weight: 224 lbs 08/28/2023 Office Weight: 224 lbs 10/26/2023 Office Weight: 222 lbs   Attempted call to patient and unable to reach.  Left detailed message per DPR regarding transmission.  Transmission results reviewed.    Corvue thoracic impedance suggesting possible fluid accumulation starting 8/24.   Prescribed:  Furosemide  20 mg Take 1 tablet (20 mg) by mouth as needed.  Potassium 20 mEq take 1 tablet by mouth as needed.   Labs: 10/26/2023 Creatinine 0.99, BUN 11, Potassium 4.5, Sodium 139, GFR >60 08/28/2023 Creatinine 0.99, BUN 8,   Potassium 4.0, Sodium 139, GFR >60 07/18/2023 Creatinine 0.98, BUN 11, Potassium 3.5, Sodium 134, GFR >60  07/17/2023 Creatinine 0.88, BUN 9,   Potassium 3.6, Sodium 136, GFR >60  07/16/2023 Creatinine 1.02, BUN 9,   Potassium 4.0, Sodium 132, GFR >60  A complete set of results can be found in Results Review.   Recommendations:  Left voice mail with ICM number and encouraged to call if experiencing any fluid symptoms.  Will advise to take PRN Lasix  if patient is reached.   Follow-up plan: ICM clinic phone appointment on 03/29/2024 to recheck fluid levels.   91 day device clinic remote transmission 04/01/2024.     EP/Cardiology next office visit:  Recall 04/23/2024 with Dr Rolan.  Recall 01/27/2024 with [name], NP.    Copy of ICM check sent to Dr. Kennyth.    3 month ICM trend: 03/21/2024.    12-14 Month ICM trend:     [name], RN 03/23/2024 7:54 AM

## 2024-03-29 ENCOUNTER — Ambulatory Visit: Attending: Cardiology

## 2024-03-29 DIAGNOSIS — I5022 Chronic systolic (congestive) heart failure: Secondary | ICD-10-CM

## 2024-03-29 DIAGNOSIS — Z95810 Presence of automatic (implantable) cardiac defibrillator: Secondary | ICD-10-CM

## 2024-03-30 NOTE — Progress Notes (Signed)
 EPIC Encounter for ICM Monitoring  Patient Name: Alison Howard is a 63 y.o. female Date: 03/30/2024 Primary Care Physican: Arloa Elsie SAUNDERS, MD Primary Cardiologist: Rolan Electrophysiologist: Kennyth BiV Pacing: 98% 07/31/2023 Office Weight: 224 lbs 08/28/2023 Office Weight: 224 lbs 10/26/2023 Office Weight: 222 lbs   Transmission results reviewed.    Corvue thoracic impedance suggesting possible fluid accumulation starting 8/24 and returned to normal 8/27.   Prescribed:  Furosemide  20 mg Take 1 tablet (20 mg) by mouth as needed.  Potassium 20 mEq take 1 tablet by mouth as needed.   Labs: 10/26/2023 Creatinine 0.99, BUN 11, Potassium 4.5, Sodium 139, GFR >60 08/28/2023 Creatinine 0.99, BUN 8,   Potassium 4.0, Sodium 139, GFR >60 07/18/2023 Creatinine 0.98, BUN 11, Potassium 3.5, Sodium 134, GFR >60  07/17/2023 Creatinine 0.88, BUN 9,   Potassium 3.6, Sodium 136, GFR >60  07/16/2023 Creatinine 1.02, BUN 9,   Potassium 4.0, Sodium 132, GFR >60  A complete set of results can be found in Results Review.   Recommendations:  No changes   Follow-up plan: ICM clinic phone appointment on 04/18/2024.   91 day device clinic remote transmission 04/01/2024.     EP/Cardiology next office visit:  Recall 04/23/2024 with Dr Rolan.  Recall 01/27/2024 with [name], NP.    Copy of ICM check sent to Dr. Kennyth.    3 month ICM trend: 03/29/2024.    12-14 Month ICM trend:     [name], RN 03/30/2024 12:52 PM

## 2024-04-01 ENCOUNTER — Ambulatory Visit (INDEPENDENT_AMBULATORY_CARE_PROVIDER_SITE_OTHER): Payer: Self-pay

## 2024-04-01 DIAGNOSIS — I5022 Chronic systolic (congestive) heart failure: Secondary | ICD-10-CM

## 2024-04-02 LAB — CUP PACEART REMOTE DEVICE CHECK
Battery Remaining Longevity: 26 mo
Battery Remaining Percentage: 28 %
Battery Voltage: 2.89 V
Brady Statistic AP VP Percent: 3 %
Brady Statistic AP VS Percent: 1 %
Brady Statistic AS VP Percent: 95 %
Brady Statistic AS VS Percent: 1 %
Brady Statistic RA Percent Paced: 1.3 %
Date Time Interrogation Session: 20250905044442
HighPow Impedance: 65 Ohm
HighPow Impedance: 65 Ohm
Implantable Lead Connection Status: 753985
Implantable Lead Connection Status: 753985
Implantable Lead Connection Status: 753985
Implantable Lead Implant Date: 20120529
Implantable Lead Implant Date: 20120529
Implantable Lead Implant Date: 20120529
Implantable Lead Location: 753858
Implantable Lead Location: 753859
Implantable Lead Location: 753860
Implantable Pulse Generator Implant Date: 20190724
Lead Channel Impedance Value: 1200 Ohm
Lead Channel Impedance Value: 430 Ohm
Lead Channel Impedance Value: 590 Ohm
Lead Channel Pacing Threshold Amplitude: 0.75 V
Lead Channel Pacing Threshold Amplitude: 1.5 V
Lead Channel Pacing Threshold Amplitude: 1.75 V
Lead Channel Pacing Threshold Pulse Width: 0.5 ms
Lead Channel Pacing Threshold Pulse Width: 0.5 ms
Lead Channel Pacing Threshold Pulse Width: 0.5 ms
Lead Channel Sensing Intrinsic Amplitude: 12 mV
Lead Channel Sensing Intrinsic Amplitude: 3.1 mV
Lead Channel Setting Pacing Amplitude: 2 V
Lead Channel Setting Pacing Amplitude: 2.25 V
Lead Channel Setting Pacing Amplitude: 2.25 V
Lead Channel Setting Pacing Pulse Width: 0.5 ms
Lead Channel Setting Pacing Pulse Width: 0.5 ms
Lead Channel Setting Sensing Sensitivity: 0.5 mV
Pulse Gen Serial Number: 9842506

## 2024-04-03 ENCOUNTER — Ambulatory Visit: Payer: Self-pay | Admitting: Cardiology

## 2024-04-04 ENCOUNTER — Other Ambulatory Visit: Payer: Self-pay

## 2024-04-06 MED ORDER — SACUBITRIL-VALSARTAN 24-26 MG PO TABS: 1.0000 | ORAL_TABLET | Freq: Two times a day (BID) | ORAL | 3 refills | Status: AC

## 2024-04-09 NOTE — Progress Notes (Signed)
Remote ICD Transmission.

## 2024-04-18 ENCOUNTER — Telehealth: Payer: Self-pay

## 2024-04-18 ENCOUNTER — Ambulatory Visit: Attending: Cardiology

## 2024-04-18 DIAGNOSIS — I5022 Chronic systolic (congestive) heart failure: Secondary | ICD-10-CM

## 2024-04-18 DIAGNOSIS — Z95810 Presence of automatic (implantable) cardiac defibrillator: Secondary | ICD-10-CM | POA: Diagnosis not present

## 2024-04-18 NOTE — Telephone Encounter (Signed)
 Remote ICM transmission received.  Attempted call to patient regarding ICM remote transmission.  Left detailed message per DPR with ICM phone number to return call for any questions, concerns or fluid symptoms.

## 2024-04-18 NOTE — Progress Notes (Signed)
 EPIC Encounter for ICM Monitoring  Patient Name: Alison Howard is a 63 y.o. female Date: 04/18/2024 Primary Care Physican: Arloa Elsie SAUNDERS, MD Primary Cardiologist: Rolan Electrophysiologist: Kennyth BiV Pacing: 98% 07/31/2023 Office Weight: 224 lbs 08/28/2023 Office Weight: 224 lbs 10/26/2023 Office Weight: 222 lbs   Attempted call to patient and unable to reach.  Left detailed message per DPR regarding transmission.  Transmission results reviewed.    Corvue thoracic impedance suggesting possible fluid accumulation starting 9/15.   Prescribed:  Furosemide  20 mg Take 1 tablet (20 mg) by mouth as needed.  Potassium 20 mEq take 1 tablet by mouth as needed.   Labs: 10/26/2023 Creatinine 0.99, BUN 11, Potassium 4.5, Sodium 139, GFR >60 08/28/2023 Creatinine 0.99, BUN 8,   Potassium 4.0, Sodium 139, GFR >60 07/18/2023 Creatinine 0.98, BUN 11, Potassium 3.5, Sodium 134, GFR >60  07/17/2023 Creatinine 0.88, BUN 9,   Potassium 3.6, Sodium 136, GFR >60  07/16/2023 Creatinine 1.02, BUN 9,   Potassium 4.0, Sodium 132, GFR >60  A complete set of results can be found in Results Review.   Recommendations:  Left voice mail with ICM number and encouraged to call if experiencing any fluid symptoms.  Will advise to take PRN Lasix  and Potassium and send to Dr Rolan for review if patient is reached.     Follow-up plan: ICM clinic phone appointment on 04/25/2024 to recheck fluid levels.   91 day device clinic remote transmission 07/01/2024.     EP/Cardiology next office visit:  Recall 04/23/2024 with Dr Rolan.  Recall 01/27/2024 with [name], NP.    Copy of ICM check sent to Dr. Kennyth.    3 month ICM trend: 04/18/2024.    12-14 Month ICM trend:     [name], RN 04/18/2024 9:54 AM

## 2024-04-25 ENCOUNTER — Ambulatory Visit: Attending: Cardiology

## 2024-04-25 DIAGNOSIS — I5022 Chronic systolic (congestive) heart failure: Secondary | ICD-10-CM

## 2024-04-25 DIAGNOSIS — Z95810 Presence of automatic (implantable) cardiac defibrillator: Secondary | ICD-10-CM

## 2024-04-27 NOTE — Progress Notes (Signed)
 EPIC Encounter for ICM Monitoring  Patient Name: Alison Howard is a 63 y.o. female Date: 04/27/2024 Primary Care Physican: Arloa Elsie SAUNDERS, MD Primary Cardiologist: Rolan Electrophysiologist: Kennyth BiV Pacing: 98% 07/31/2023 Office Weight: 224 lbs 08/28/2023 Office Weight: 224 lbs 10/26/2023 Office Weight: 222 lbs   Transmission results reviewed.    Since 04/18/2024 ICM Remote Transmission: Corvue thoracic impedance suggesting fluid levels returned to normal 04/19/2024.   Prescribed:  Furosemide  20 mg Take 1 tablet (20 mg) by mouth as needed.  Potassium 20 mEq take 1 tablet by mouth as needed.   Labs: 10/26/2023 Creatinine 0.99, BUN 11, Potassium 4.5, Sodium 139, GFR >60 08/28/2023 Creatinine 0.99, BUN 8,   Potassium 4.0, Sodium 139, GFR >60 07/18/2023 Creatinine 0.98, BUN 11, Potassium 3.5, Sodium 134, GFR >60  07/17/2023 Creatinine 0.88, BUN 9,   Potassium 3.6, Sodium 136, GFR >60  07/16/2023 Creatinine 1.02, BUN 9,   Potassium 4.0, Sodium 132, GFR >60  A complete set of results can be found in Results Review.   Recommendations:  Left voice mail with ICM number and encouraged to call if experiencing any fluid symptoms.  Will advise to take PRN Lasix  and Potassium and send to Dr Rolan for review if patient is reached.     Follow-up plan: ICM clinic phone appointment on 05/23/2024.   91 day device clinic remote transmission 07/01/2024.     EP/Cardiology next office visit:  Recall 04/23/2024 with Dr Rolan.  Recall 01/27/2024 with [name], NP.    Copy of ICM check sent to Dr. Kennyth.    Remote monitoring is medically necessary for Heart Failure Management.    90 day Daily Thoracic Impedance ICM trend: 01/25/2024 through 04/25/2024.    12-14 Month Thoracic Impedance ICM trend:     [name], RN 04/27/2024 9:17 AM

## 2024-05-23 ENCOUNTER — Ambulatory Visit: Attending: Cardiology

## 2024-05-23 DIAGNOSIS — Z95810 Presence of automatic (implantable) cardiac defibrillator: Secondary | ICD-10-CM | POA: Diagnosis not present

## 2024-05-23 DIAGNOSIS — I5022 Chronic systolic (congestive) heart failure: Secondary | ICD-10-CM | POA: Diagnosis not present

## 2024-05-25 ENCOUNTER — Telehealth: Payer: Self-pay

## 2024-05-25 NOTE — Progress Notes (Signed)
 EPIC Encounter for ICM Monitoring  Patient Name: Alison Howard is a 63 y.o. female Date: 05/25/2024 Primary Care Physican: Arloa Elsie SAUNDERS, MD Primary Cardiologist: Rolan Electrophysiologist: Kennyth BiV Pacing: 98% 07/31/2023 Office Weight: 224 lbs 08/28/2023 Office Weight: 224 lbs 10/26/2023 Office Weight: 222 lbs   Attempted call to patient and unable to reach.  Left detailed message per DPR regarding transmission.  Transmission results reviewed.    Since 04/25/2024 ICM Remote Transmission: Corvue thoracic impedance suggesting normal fluid levels since 04/28/2024.   Prescribed:  Furosemide  20 mg Take 1 tablet (20 mg) by mouth as needed.  Potassium 20 mEq take 1 tablet by mouth as needed.   Labs: 10/26/2023 Creatinine 0.99, BUN 11, Potassium 4.5, Sodium 139, GFR >60 08/28/2023 Creatinine 0.99, BUN 8,   Potassium 4.0, Sodium 139, GFR >60 07/18/2023 Creatinine 0.98, BUN 11, Potassium 3.5, Sodium 134, GFR >60  07/17/2023 Creatinine 0.88, BUN 9,   Potassium 3.6, Sodium 136, GFR >60  07/16/2023 Creatinine 1.02, BUN 9,   Potassium 4.0, Sodium 132, GFR >60  A complete set of results can be found in Results Review.   Recommendations:  Left voice mail with ICM number and encouraged to call if experiencing any fluid symptoms.  Left message to call HF clinic and EP office to schedule overdue appointments.   Follow-up plan: ICM clinic phone appointment on 06/27/2024.   91 day device clinic remote transmission 07/01/2024.     EP/Cardiology next office visit:  Recall 04/23/2024 with Dr Rolan.  Recall 01/27/2024 with [name], NP.    Copy of ICM check sent to Dr. Kennyth.   Remote monitoring is medically necessary for Heart Failure Management.    Daily Thoracic Impedance ICM trend: 02/23/2024 through 05/23/2024.    12-14 Month Thoracic Impedance ICM trend:     [name], RN 05/25/2024 9:12 AM

## 2024-05-25 NOTE — Telephone Encounter (Signed)
 Remote ICM transmission received.  Attempted call to patient regarding ICM remote transmission.  Left detailed message per DPR with ICM phone number to return call for any questions, concerns or fluid symptoms.

## 2024-06-14 ENCOUNTER — Telehealth (HOSPITAL_COMMUNITY): Payer: Self-pay

## 2024-06-14 NOTE — Telephone Encounter (Signed)
 Called to confirm/remind patient of their appointment at the [facility] on 06/15/24 2:00.   Appointment:   [] Confirmed  [x] Left mess   [] No answer/No voice mail  [] VM Full/unable to leave message  [] Phone not in service  Patient reminded to bring all medications and/or complete list.  Confirmed patient has transportation. Gave directions, instructed to utilize valet parking.

## 2024-06-14 NOTE — Progress Notes (Signed)
 [facility] CLIN[facility] NOTE    PCP:  Arloa Fallow, MD  HF Cardiologist:  Dr. Rolan  Reason for Visit: Heart Failure Follow-up HPI: Alison Howard is a 63 y.o. female who has a history of CHF, VT. She has a long history of cardiomyopathy, dating back to at least 2013.  She has a [employer] CRT-D device.  She has also had up to 22% PVCs in the past, down to 3.1% on 10/19 holter.   Echo in 12/19 showed severe dilation of the LV with EF 25-30%. RHC/LHC in 3/20 showed normal filling pressures, CI 2.37, and no significant CAD. CPX in 3/20 showed no significant HF limitation, main problem appeared to be deconditioning. Echo in 5/22 showed EF 35-40% with basal inferoseptal, basal anteroseptal, and basal inferior akinesis.   She was admitted in 1/23 with CHF and runs of NSVT.  ICD did not discharge. She was diuresed and flecainide  was stopped and replaced with amiodarone . No further VT.   Attempted VT ablation in 3/23 but unsuccessful.   Echo in 11/24 showed EF 40% with mild LV dilation, basal-mid septal akinesis, normal RV, IVC normal.   She was admitted in 12/24 with VT requiring ATP and defibrillation.  This occurred in setting of infection/PNA with non-COVID coronavirus. Echo showed EF 40%, basal septal akinesis, normal RV.  Mexiletine was started.   She presents today for 6 month f/u. Reports doing fairly well but currently dealing w/ URI symptoms w/ cough and nasal congestion. Otherwise, reports stable NYHA Class II symptoms. Says wt has been stable at home. No LEE. She takes lasix  PRN, last used ~2 wks ago. Device interrogation today shows some fluid accumulation over the last 7 days. No VT/VF. 98% BiV pacing.   She reports full med compliance, BP well controlled 110/64.     Current Outpatient Medications  Medication Sig Dispense Refill   bisoprolol  (ZEBETA ) 5 MG tablet Take 1 tablet (5 mg total) by mouth 2 (two) times daily. 180 tablet 3   empagliflozin  (JARDIANCE ) 10 MG TABS tablet Take  1 tablet (10 mg total) by mouth daily before breakfast. 90 tablet 3   eplerenone  (INSPRA ) 50 MG tablet Take 1 tablet (50 mg total) by mouth daily. 90 tablet 3   levothyroxine  (SYNTHROID ) 50 MCG tablet 1 tablet in the morning on an empty stomach Orally Once a day alternating with 75 mcg every other day for 90 days     levothyroxine  (SYNTHROID ) 75 MCG tablet 1 tablet in the morning on an empty stomach Orally Every other day alternating with levothyroxine  50 mcg     LORazepam  (ATIVAN ) 0.5 MG tablet Take 0.5 mg by mouth 2 (two) times daily as needed for anxiety.     magnesium  oxide (MAG-OX) 400 (240 Mg) MG tablet Take 1 tablet (400 mg total) by mouth 2 (two) times daily.     mexiletine (MEXITIL ) 250 MG capsule Take 1 capsule (250 mg total) by mouth 2 (two) times daily. 180 capsule 3   pantoprazole  (PROTONIX ) 40 MG tablet Take 40 mg by mouth 2 (two) times daily.      pravastatin  (PRAVACHOL ) 80 MG tablet TAKE 1 TABLET DAILY 90 tablet 3   sacubitril -valsartan  (ENTRESTO ) 24-26 MG Take 1 tablet by mouth 2 (two) times daily. 180 tablet 3   sertraline  (ZOLOFT ) 100 MG tablet Take 100 mg by mouth daily.     furosemide  (LASIX ) 20 MG tablet Take 20 mg by mouth as needed.     potassium chloride  SA (  KLOR-CON  M) 20 MEQ tablet Take 20 mEq by mouth as needed.     No current facility-administered medications for this encounter.   Allergies:   Ciprofloxacin hcl, Codeine, Morphine and codeine, Sulfa antibiotics, Zetia  [ezetimibe ], Atorvastatin, Dapagliflozin , Dextromethorphan, Rosuvastatin, Simvastatin, and Welchol  [colesevelam ]   Social History:  The patient  reports that she has never smoked. She has never used smokeless tobacco. She reports current alcohol use. She reports that she does not use drugs.   Family History:  The patient's family history includes Coronary artery disease in her mother; Heart attack in her mother; Heart disease in her father; Hyperlipidemia in her sister.   ROS:  Please see the history of  present illness.   All other systems are personally reviewed and negative.    BP 110/64   Pulse 75   Ht 5' 2 (1.575 m)   Wt 100.7 kg (222 lb)   SpO2 95%   BMI 40.60 kg/m   Filed Weights   06/15/24 1407  Weight: 100.7 kg (222 lb)    Physical Exam: GENERAL: NAD Lungs- clear  CARDIAC:  JVP 7-8 cm           Normal rate with regular rhythm. No MRG. No LEE  ABDOMEN: Soft, non-tender, non-distended.  EXTREMITIES: Warm and well perfused.  NEUROLOGIC: No obvious FND   [employer] CRT-D device (personally reviewed) 98% BiV pacing,  0% AF burden, CorVue w/ decreased impedence, No VT  ECG: not performed   ASSESSMENT AND PLAN: 1. Chronic systolic CHF: Long history of nonischemic cardiomyopathy.  Echo in 10/19 with severe LV dilation and EF 25-30%.  Has had frequent PVCs in the past, but holter in 10/19 showed PVC count down to 3.1%.  She has a [employer] CRT-D device. RHC/LHC in 3/20 with no significant CAD, normal feeling pressures, and preserved cardiac output.  CPX showed deconditioning but no significant HF limitation. Cardiac MRI in 6/20 was a very difficult study due to pacemaker artifact, LV EF 34% but LGE was not interpretable.  Echo in 5/22 showed EF 35-40% with basal inferoseptal, basal anteroseptal, and basal inferior akinesis. Echo in 11/24 and again in 12/24 showed EF 40% with mild LV dilation, basal-mid septal akinesis, normal RV, IVC normal.  CT PET FDG uptake inconsistent for active inflammation/sarcoidosis, EF 39%, medium defect w severe reduction of up take in mid to basal septum with WMAs. - Stable NYHA Class II. Device interrogation suggest fluid overload. Take lasix  20 mg daily x 3 days then back to PRN. She is followed by ICM device clinic  - Continue Entresto  24/26 mg bid (orthostatic symptoms on higher doses).  - Continue bisoprolol  5 mg bid.  - Continue eplerenone  50 mg daily.  - Continue Jardiance  10 mg daily.  - Check BMP and BNP today  - Update Echo   2. PVCs: Count  down to 3.1% with holter in 10/19 but EF remained 25-30% by echo. Suspect not primarily a PVC-mediated CMP therefore.  PVCs likely are a consequence of the cardiomyopathy.  She had symptomatic NSVT runs in 1/23 and flecainide  was stopped, amiodarone  started.  Now off amiodarone .  Unsuccessful attempt at VT ablation in 3/23.  Now on mexiletine.  - she denies palpitations - RRR on exam today  - continue mexiletine   3. Obesity: Body mass index is 40.6 kg/m.  - we dicussed GLP1 but she does not want to use injectable meds.  4. Hyperlipidemia: Recent LDL 130. She will not use any injectable meds.  She is on pravastatin  80 mg daily. She cannot tolerate other statins or Zetia .   5. OSA: Unable to tolerate CPAP.  6. VT: On mexiletine 200 mg bid. No VT on device interrogation.  Update echo. F/u w/ Dr. Rolan in 6 months   Caffie Shed, PA-C  06/15/2024

## 2024-06-15 ENCOUNTER — Ambulatory Visit (HOSPITAL_COMMUNITY)
Admission: RE | Admit: 2024-06-15 | Discharge: 2024-06-15 | Disposition: A | Discharge status: Home / Self Care | Source: Ambulatory Visit | Attending: Cardiology | Admitting: Cardiology

## 2024-06-15 ENCOUNTER — Encounter (HOSPITAL_COMMUNITY): Payer: Self-pay

## 2024-06-15 VITALS — BP 110/64 | HR 75 | Ht 62.0 in | Wt 222.0 lb

## 2024-06-15 DIAGNOSIS — E785 Hyperlipidemia, unspecified: Secondary | ICD-10-CM | POA: Diagnosis not present

## 2024-06-15 DIAGNOSIS — E669 Obesity, unspecified: Secondary | ICD-10-CM | POA: Insufficient documentation

## 2024-06-15 DIAGNOSIS — I5022 Chronic systolic (congestive) heart failure: Secondary | ICD-10-CM | POA: Diagnosis not present

## 2024-06-15 DIAGNOSIS — I428 Other cardiomyopathies: Secondary | ICD-10-CM | POA: Diagnosis not present

## 2024-06-15 DIAGNOSIS — Z79899 Other long term (current) drug therapy: Secondary | ICD-10-CM | POA: Diagnosis not present

## 2024-06-15 DIAGNOSIS — G4733 Obstructive sleep apnea (adult) (pediatric): Secondary | ICD-10-CM | POA: Diagnosis not present

## 2024-06-15 DIAGNOSIS — Z6841 Body Mass Index (BMI) 40.0 and over, adult: Secondary | ICD-10-CM | POA: Diagnosis not present

## 2024-06-15 DIAGNOSIS — Z950 Presence of cardiac pacemaker: Secondary | ICD-10-CM | POA: Insufficient documentation

## 2024-06-15 DIAGNOSIS — Z7984 Long term (current) use of oral hypoglycemic drugs: Secondary | ICD-10-CM | POA: Insufficient documentation

## 2024-06-15 DIAGNOSIS — I493 Ventricular premature depolarization: Secondary | ICD-10-CM | POA: Diagnosis not present

## 2024-06-15 LAB — BASIC METABOLIC PANEL WITH GFR
Anion gap: 14 (ref 5–15)
BUN: 5 mg/dL — ABNORMAL LOW (ref 8–23)
CO2: 27 mmol/L (ref 22–32)
Calcium: 9.3 mg/dL (ref 8.9–10.3)
Chloride: 100 mmol/L (ref 98–111)
Creatinine, Ser: 1.05 mg/dL — ABNORMAL HIGH (ref 0.44–1.00)
GFR, Estimated: 60 mL/min — ABNORMAL LOW (ref >60)
Glucose, Bld: 96 mg/dL (ref 70–99)
Potassium: 4.4 mmol/L (ref 3.5–5.1)
Sodium: 141 mmol/L (ref 135–145)

## 2024-06-15 LAB — BRAIN NATRIURETIC PEPTIDE: B Natriuretic Peptide: 177 pg/mL — ABNORMAL HIGH (ref 0.0–100.0)

## 2024-06-15 NOTE — Patient Instructions (Signed)
 Medication Changes:  TAKE YOUR FUROSEMIDE  AND POTASSIUM DAILY FOR THE NEXT 3 DAYS, AND THEN RETURN TO JUST AS NEEDED   Lab Work:  Labs done today, your results will be available in MyChart, we will contact you for abnormal readings.  Testing/Procedures:  ECHOCARDIOGRAM AS SCHEDULED   Follow-Up in: 6 MONTHS PLEASE CALL OUR OFFICE AROUND APRIL 2026 TO GET SCHEDULED FOR YOUR APPOINTMENT. PHONE NUMBER IS [phone] OPTION 2   At the [facility], you and your health needs are our priority. We have a designated team specialized in the treatment of Heart Failure. This Care Team includes your primary Heart Failure Specialized Cardiologist (physician), Advanced Practice Providers (APPs- Physician Assistants and Nurse Practitioners), and Pharmacist who all work together to provide you with the care you need, when you need it.   You may see any of the following providers on your designated Care Team at your next follow up:  Dr. Toribio Fuel Dr. Ezra Shuck Dr. Odis Brownie Greig Mosses, NP Caffie Shed, [place] [facility] [place], [place] Beckey Coe, NP Jordan Lee, NP Tinnie Redman, PharmD   Please be sure to bring in all your medications bottles to every appointment.   Need to Contact Us :  If you have any questions or concerns before your next appointment please send us  a message through [place] or call our office at [phone].    TO LEAVE A MESSAGE FOR THE NURSE SELECT OPTION 2, PLEASE LEAVE A MESSAGE INCLUDING: YOUR NAME DATE OF BIRTH CALL BACK NUMBER REASON FOR CALL**this is important as we prioritize the call backs  YOU WILL RECEIVE A CALL BACK THE SAME DAY AS LONG AS YOU CALL BEFORE 4:00 PM

## 2024-06-16 ENCOUNTER — Ambulatory Visit (HOSPITAL_COMMUNITY): Payer: Self-pay | Admitting: Cardiology

## 2024-06-27 ENCOUNTER — Ambulatory Visit: Attending: Cardiology

## 2024-06-27 DIAGNOSIS — Z95810 Presence of automatic (implantable) cardiac defibrillator: Secondary | ICD-10-CM | POA: Diagnosis not present

## 2024-06-27 DIAGNOSIS — I5022 Chronic systolic (congestive) heart failure: Secondary | ICD-10-CM

## 2024-06-27 NOTE — Progress Notes (Signed)
 EPIC Encounter for ICM Monitoring  Patient Name: Alison Howard is a 63 y.o. female Date: 06/27/2024 Primary Care Physican: Arloa Elsie SAUNDERS, MD Primary Cardiologist: Rolan Electrophysiologist: Kennyth BiV Pacing: 98% 07/31/2023 Office Weight: 224 lbs 08/28/2023 Office Weight: 224 lbs 10/26/2023 Office Weight: 222 lbs 06/18/2024 Office Weight: 222 lbs   Attempted call to patient and unable to reach.  Left detailed message per DPR regarding transmission.  Transmission results reviewed.   Instructed at 06/15/2024 to take PRN Lasix  20 mg x 3 days.    Since 05/23/2024 ICM Remote Transmission: Corvue thoracic impedance suggesting possible fluid accumulation from 06/08/2024-06/16/2024 followed by possible dryness 06/17/2024-06/25/2024 (maybe in response to PRN Lasix ).    Prescribed:  Furosemide  20 mg Take 1 tablet (20 mg) by mouth as needed.  Potassium 20 mEq take 1 tablet by mouth as needed.   Labs: 06/15/2024 Creatinine 1.05, BUN 5,   Potassium 4.4, Sodium 141, GFR 60 10/26/2023 Creatinine 0.99, BUN 11, Potassium 4.5, Sodium 139, GFR >60 08/28/2023 Creatinine 0.99, BUN 8,   Potassium 4.0, Sodium 139, GFR >60 07/18/2023 Creatinine 0.98, BUN 11, Potassium 3.5, Sodium 134, GFR >60  07/17/2023 Creatinine 0.88, BUN 9,   Potassium 3.6, Sodium 136, GFR >60  07/16/2023 Creatinine 1.02, BUN 9,   Potassium 4.0, Sodium 132, GFR >60  A complete set of results can be found in Results Review.   Recommendations:  No changes and encouraged to call if experiencing any fluid symptoms.   Follow-up plan: ICM clinic phone appointment on 08/08/2024.   91 day device clinic remote transmission 07/01/2024.     EP/Cardiology next office visit:   Advised to call office to schedule EP appt.  Recall 01/27/2024 with [name], NP (last visit 07/31/2023).    Copy of ICM check sent to Dr. Kennyth.   Remote monitoring is medically necessary for Heart Failure Management.    Daily Thoracic Impedance ICM trend:  03/29/2024 through 06/27/2024.    12-14 Month Thoracic Impedance ICM trend:     [name], RN 06/27/2024 4:06 PM

## 2024-07-01 ENCOUNTER — Ambulatory Visit: Payer: Self-pay

## 2024-07-01 DIAGNOSIS — I5022 Chronic systolic (congestive) heart failure: Secondary | ICD-10-CM

## 2024-07-03 LAB — CUP PACEART REMOTE DEVICE CHECK
Battery Remaining Longevity: 24 mo
Battery Remaining Percentage: 26 %
Battery Voltage: 2.89 V
Brady Statistic AP VP Percent: 3.1 %
Brady Statistic AP VS Percent: 1 %
Brady Statistic AS VP Percent: 95 %
Brady Statistic AS VS Percent: 1 %
Brady Statistic RA Percent Paced: 1.6 %
Date Time Interrogation Session: 20251205041738
HighPow Impedance: 73 Ohm
HighPow Impedance: 73 Ohm
Implantable Lead Connection Status: 753985
Implantable Lead Connection Status: 753985
Implantable Lead Connection Status: 753985
Implantable Lead Implant Date: 20120529
Implantable Lead Implant Date: 20120529
Implantable Lead Implant Date: 20120529
Implantable Lead Location: 753858
Implantable Lead Location: 753859
Implantable Lead Location: 753860
Implantable Pulse Generator Implant Date: 20190724
Lead Channel Impedance Value: 1225 Ohm
Lead Channel Impedance Value: 490 Ohm
Lead Channel Impedance Value: 590 Ohm
Lead Channel Pacing Threshold Amplitude: 0.75 V
Lead Channel Pacing Threshold Amplitude: 1.5 V
Lead Channel Pacing Threshold Amplitude: 1.75 V
Lead Channel Pacing Threshold Pulse Width: 0.5 ms
Lead Channel Pacing Threshold Pulse Width: 0.5 ms
Lead Channel Pacing Threshold Pulse Width: 0.5 ms
Lead Channel Sensing Intrinsic Amplitude: 10.6 mV
Lead Channel Sensing Intrinsic Amplitude: 3.2 mV
Lead Channel Setting Pacing Amplitude: 2 V
Lead Channel Setting Pacing Amplitude: 2.25 V
Lead Channel Setting Pacing Amplitude: 2.25 V
Lead Channel Setting Pacing Pulse Width: 0.5 ms
Lead Channel Setting Pacing Pulse Width: 0.5 ms
Lead Channel Setting Sensing Sensitivity: 0.5 mV
Pulse Gen Serial Number: 9842506

## 2024-07-05 NOTE — Progress Notes (Signed)
 Remote ICD Transmission

## 2024-07-15 ENCOUNTER — Ambulatory Visit: Payer: Self-pay | Admitting: Cardiology

## 2024-07-19 ENCOUNTER — Encounter: Payer: Self-pay | Admitting: Cardiology

## 2024-07-20 ENCOUNTER — Ambulatory Visit (HOSPITAL_COMMUNITY)
Admission: RE | Admit: 2024-07-20 | Discharge: 2024-07-20 | Disposition: A | Discharge status: Home / Self Care | Source: Ambulatory Visit | Attending: Family Medicine | Admitting: Family Medicine

## 2024-07-20 DIAGNOSIS — G4730 Sleep apnea, unspecified: Secondary | ICD-10-CM | POA: Diagnosis not present

## 2024-07-20 DIAGNOSIS — I429 Cardiomyopathy, unspecified: Secondary | ICD-10-CM | POA: Insufficient documentation

## 2024-07-20 DIAGNOSIS — I5022 Chronic systolic (congestive) heart failure: Secondary | ICD-10-CM | POA: Diagnosis present

## 2024-07-23 LAB — ECHOCARDIOGRAM COMPLETE
AR max vel: 2 cm2
AV Area VTI: 2.17 cm2
AV Area mean vel: 1.96 cm2
AV Mean grad: 5 mmHg
AV Peak grad: 6.9 mmHg
Ao pk vel: 1.31 m/s
Area-P 1/2: 4.1 cm2
S' Lateral: 5.3 cm

## 2024-08-08 ENCOUNTER — Ambulatory Visit: Payer: Self-pay | Attending: Cardiology

## 2024-08-08 DIAGNOSIS — Z95810 Presence of automatic (implantable) cardiac defibrillator: Secondary | ICD-10-CM

## 2024-08-08 DIAGNOSIS — I5022 Chronic systolic (congestive) heart failure: Secondary | ICD-10-CM

## 2024-08-09 NOTE — Progress Notes (Signed)
 EPIC Encounter for ICM Monitoring  Patient Name: Alison Howard is a 64 y.o. female Date: 08/09/2024 Primary Care Physican: Arloa Elsie SAUNDERS, MD Primary Cardiologist: Rolan Electrophysiologist: Kennyth BiV Pacing: 98% 07/31/2023 Office Weight: 224 lbs 08/28/2023 Office Weight: 224 lbs 10/26/2023 Office Weight: 222 lbs 06/18/2024 Office Weight: 222 lbs   Transmission results reviewed.    Since 06/27/26/2025 ICM Remote Transmission: Corvue thoracic impedance suggesting normal fluid levels with the exception of possible fluid accumulation from 07/15/2024-07/23/2024.    Prescribed:  Furosemide  20 mg Take 1 tablet (20 mg) by mouth as needed.  Potassium 20 mEq take 1 tablet by mouth as needed.   Labs: 06/15/2024 Creatinine 1.05, BUN 5,   Potassium 4.4, Sodium 141, GFR 60 10/26/2023 Creatinine 0.99, BUN 11, Potassium 4.5, Sodium 139, GFR >60 08/28/2023 Creatinine 0.99, BUN 8,   Potassium 4.0, Sodium 139, GFR >60 07/18/2023 Creatinine 0.98, BUN 11, Potassium 3.5, Sodium 134, GFR >60  07/17/2023 Creatinine 0.88, BUN 9,   Potassium 3.6, Sodium 136, GFR >60  07/16/2023 Creatinine 1.02, BUN 9,   Potassium 4.0, Sodium 132, GFR >60  A complete set of results can be found in Results Review.   Recommendations:  No changes.   Follow-up plan: ICM clinic phone appointment on 09/08/2024.   91 day device clinic remote transmission 09/30/2024.     EP/Cardiology next office visit:  09/14/2024 with Dr Kennyth.    Copy of ICM check sent to Dr. Kennyth.     Remote monitoring is medically necessary for Heart Failure Management.    Daily Thoracic Impedance ICM trend: 05/10/2024 through 08/08/2024.    12-14 Month Thoracic Impedance ICM trend:     [name], RN 08/09/2024 11:20 AM

## 2024-08-16 ENCOUNTER — Ambulatory Visit: Admitting: Cardiology

## 2024-08-25 NOTE — Progress Notes (Unsigned)
 31 day ICM Remote transmission canceled due to [facility] clinic is on hold until further notice.  91 day remote monitoring will continue per protocol.

## 2024-09-08 ENCOUNTER — Ambulatory Visit: Payer: Self-pay

## 2024-09-14 ENCOUNTER — Ambulatory Visit: Payer: Self-pay | Admitting: Cardiology

## 2024-09-30 ENCOUNTER — Ambulatory Visit: Payer: Self-pay

## 2024-12-30 ENCOUNTER — Ambulatory Visit: Payer: Self-pay

## 2025-03-31 ENCOUNTER — Ambulatory Visit: Payer: Self-pay

## 2025-06-30 ENCOUNTER — Ambulatory Visit: Payer: Self-pay

## 2025-09-29 ENCOUNTER — Ambulatory Visit: Payer: Self-pay
